# Patient Record
Sex: Female | Born: 1935 | Race: White | Hispanic: No | Marital: Married | Smoking: Never smoker
Health system: Southern US, Community
[De-identification: ages and names within clinical notes are randomized; demographics above are authoritative.]

## PROBLEM LIST (undated history)

## (undated) DIAGNOSIS — M47816 Spondylosis without myelopathy or radiculopathy, lumbar region: Secondary | ICD-10-CM

## (undated) DIAGNOSIS — N1831 Chronic kidney disease, stage 3a: Secondary | ICD-10-CM

## (undated) DIAGNOSIS — S99912A Unspecified injury of left ankle, initial encounter: Secondary | ICD-10-CM

## (undated) DIAGNOSIS — D6869 Other thrombophilia: Secondary | ICD-10-CM

## (undated) DIAGNOSIS — I10 Essential (primary) hypertension: Secondary | ICD-10-CM

## (undated) DIAGNOSIS — M539 Dorsopathy, unspecified: Secondary | ICD-10-CM

## (undated) DIAGNOSIS — M8580 Other specified disorders of bone density and structure, unspecified site: Secondary | ICD-10-CM

## (undated) DIAGNOSIS — Z5189 Encounter for other specified aftercare: Secondary | ICD-10-CM

## (undated) DIAGNOSIS — G63 Polyneuropathy in diseases classified elsewhere: Secondary | ICD-10-CM

## (undated) DIAGNOSIS — I498 Other specified cardiac arrhythmias: Secondary | ICD-10-CM

## (undated) DIAGNOSIS — IMO0002 Reserved for concepts with insufficient information to code with codable children: Secondary | ICD-10-CM

## (undated) DIAGNOSIS — M1990 Unspecified osteoarthritis, unspecified site: Secondary | ICD-10-CM

## (undated) DIAGNOSIS — G709 Myoneural disorder, unspecified: Secondary | ICD-10-CM

## (undated) DIAGNOSIS — E039 Hypothyroidism, unspecified: Secondary | ICD-10-CM

## (undated) DIAGNOSIS — D649 Anemia, unspecified: Secondary | ICD-10-CM

## (undated) DIAGNOSIS — E785 Hyperlipidemia, unspecified: Secondary | ICD-10-CM

## (undated) DIAGNOSIS — I4510 Unspecified right bundle-branch block: Secondary | ICD-10-CM

## (undated) DIAGNOSIS — N3281 Overactive bladder: Secondary | ICD-10-CM

## (undated) DIAGNOSIS — E1169 Type 2 diabetes mellitus with other specified complication: Secondary | ICD-10-CM

## (undated) DIAGNOSIS — Z950 Presence of cardiac pacemaker: Secondary | ICD-10-CM

## (undated) DIAGNOSIS — K2960 Other gastritis without bleeding: Secondary | ICD-10-CM

## (undated) HISTORY — DX: Overactive bladder: N32.81

## (undated) HISTORY — DX: Dorsopathy, unspecified: M53.9

## (undated) HISTORY — DX: Essential (primary) hypertension: I10

## (undated) HISTORY — DX: Other thrombophilia: D68.69

## (undated) HISTORY — DX: Unspecified right bundle-branch block: I45.10

## (undated) HISTORY — PX: APPENDECTOMY: SHX54

## (undated) HISTORY — DX: Hyperlipidemia, unspecified: E78.5

## (undated) HISTORY — DX: Other gastritis without bleeding: K29.60

## (undated) HISTORY — PX: ABDOMINAL HYSTERECTOMY: SHX81

## (undated) HISTORY — DX: Presence of cardiac pacemaker: Z95.0

## (undated) HISTORY — PX: ROTATOR CUFF REPAIR: SHX139

## (undated) HISTORY — DX: Hypothyroidism, unspecified: E03.9

## (undated) HISTORY — DX: Spondylosis without myelopathy or radiculopathy, lumbar region: M47.816

## (undated) HISTORY — DX: Reserved for concepts with insufficient information to code with codable children: IMO0002

## (undated) HISTORY — DX: Other specified cardiac arrhythmias: I49.8

## (undated) HISTORY — DX: Type 2 diabetes mellitus with other specified complication: E11.69

## (undated) HISTORY — DX: Chronic kidney disease, stage 3a: N18.31

## (undated) HISTORY — DX: Polyneuropathy in diseases classified elsewhere: G63

## (undated) HISTORY — PX: CARPAL TUNNEL RELEASE: SHX101

## (undated) HISTORY — PX: TONSILLECTOMY: SUR1361

## (undated) HISTORY — DX: Other specified disorders of bone density and structure, unspecified site: M85.80

---

## 2002-05-14 HISTORY — PX: OTHER SURGICAL HISTORY: SHX169

## 2008-11-11 HISTORY — PX: JOINT REPLACEMENT: SHX530

## 2008-11-22 ENCOUNTER — Inpatient Hospital Stay (HOSPITAL_COMMUNITY): Admission: RE | Admit: 2008-11-22 | Discharge: 2008-11-25 | Payer: Self-pay | Admitting: Orthopedic Surgery

## 2010-08-20 LAB — BASIC METABOLIC PANEL
BUN: 9 mg/dL (ref 6–23)
CO2: 28 mEq/L (ref 19–32)
CO2: 30 mEq/L (ref 19–32)
Calcium: 8.4 mg/dL (ref 8.4–10.5)
Chloride: 96 mEq/L (ref 96–112)
Creatinine, Ser: 1.01 mg/dL (ref 0.4–1.2)
GFR calc Af Amer: >60 mL/min (ref >60)
GFR calc Af Amer: >60 mL/min (ref >60)
GFR calc non Af Amer: 54 mL/min — ABNORMAL LOW (ref >60)
GFR calc non Af Amer: 54 mL/min — ABNORMAL LOW (ref >60)
GFR calc non Af Amer: 55 mL/min — ABNORMAL LOW (ref >60)
Glucose, Bld: 153 mg/dL — ABNORMAL HIGH (ref 70–99)
Potassium: 3.7 mEq/L (ref 3.5–5.1)
Potassium: 4.1 mEq/L (ref 3.5–5.1)
Sodium: 129 mEq/L — ABNORMAL LOW (ref 135–145)
Sodium: 133 mEq/L — ABNORMAL LOW (ref 135–145)

## 2010-08-20 LAB — PROTIME-INR: Prothrombin Time: 12.4 seconds (ref 11.6–15.2)

## 2010-08-20 LAB — URINALYSIS, ROUTINE W REFLEX MICROSCOPIC
Ketones, ur: NEGATIVE mg/dL
Nitrite: NEGATIVE
Protein, ur: NEGATIVE mg/dL
Urobilinogen, UA: 0.2 mg/dL (ref 0.0–1.0)

## 2010-08-20 LAB — CBC
HCT: 22.6 % — ABNORMAL LOW (ref 36.0–46.0)
HCT: 25.6 % — ABNORMAL LOW (ref 36.0–46.0)
HCT: 36.2 % (ref 36.0–46.0)
Hemoglobin: 9 g/dL — ABNORMAL LOW (ref 12.0–15.0)
Hemoglobin: 9.7 g/dL — ABNORMAL LOW (ref 12.0–15.0)
MCHC: 33.7 g/dL (ref 30.0–36.0)
MCHC: 34.8 g/dL (ref 30.0–36.0)
MCHC: 35.6 g/dL (ref 30.0–36.0)
MCHC: 37 g/dL — ABNORMAL HIGH (ref 30.0–36.0)
MCV: 86.2 fL (ref 78.0–100.0)
MCV: 86.4 fL (ref 78.0–100.0)
MCV: 87.2 fL (ref 78.0–100.0)
Platelets: 141 10*3/uL — ABNORMAL LOW (ref 150–400)
Platelets: 143 10*3/uL — ABNORMAL LOW (ref 150–400)
Platelets: 213 10*3/uL (ref 150–400)
RBC: 2.63 MIL/uL — ABNORMAL LOW (ref 3.87–5.11)
RBC: 2.96 MIL/uL — ABNORMAL LOW (ref 3.87–5.11)
RDW: 13.8 % (ref 11.5–15.5)
RDW: 14.1 % (ref 11.5–15.5)
WBC: 11.2 10*3/uL — ABNORMAL HIGH (ref 4.0–10.5)
WBC: 8.7 10*3/uL (ref 4.0–10.5)
WBC: 9.3 10*3/uL (ref 4.0–10.5)

## 2010-08-20 LAB — CROSSMATCH: Antibody Screen: NEGATIVE

## 2010-08-20 LAB — COMPREHENSIVE METABOLIC PANEL
Albumin: 4.4 g/dL (ref 3.5–5.2)
BUN: 15 mg/dL (ref 6–23)
CO2: 30 mEq/L (ref 19–32)
Calcium: 10.2 mg/dL (ref 8.4–10.5)
Chloride: 99 mEq/L (ref 96–112)
Creatinine, Ser: 1 mg/dL (ref 0.4–1.2)
GFR calc non Af Amer: 54 mL/min — ABNORMAL LOW (ref >60)
Total Bilirubin: 0.6 mg/dL (ref 0.3–1.2)

## 2010-08-20 LAB — GLUCOSE, CAPILLARY
Glucose-Capillary: 104 mg/dL — ABNORMAL HIGH (ref 70–99)
Glucose-Capillary: 116 mg/dL — ABNORMAL HIGH (ref 70–99)
Glucose-Capillary: 133 mg/dL — ABNORMAL HIGH (ref 70–99)
Glucose-Capillary: 144 mg/dL — ABNORMAL HIGH (ref 70–99)
Glucose-Capillary: 147 mg/dL — ABNORMAL HIGH (ref 70–99)

## 2010-08-20 LAB — DIFFERENTIAL
Basophils Absolute: 0 10*3/uL (ref 0.0–0.1)
Lymphocytes Relative: 22 % (ref 12–46)
Neutro Abs: 6 10*3/uL (ref 1.7–7.7)

## 2010-08-20 LAB — HEMOGLOBIN A1C
Hgb A1c MFr Bld: 5.7 % (ref 4.6–6.1)
Mean Plasma Glucose: 117 mg/dL

## 2010-08-20 LAB — APTT: aPTT: 28 seconds (ref 24–37)

## 2010-08-20 LAB — ABO/RH: ABO/RH(D): A NEG

## 2010-09-26 NOTE — Op Note (Signed)
Sabrina Hunt, Sabrina Hunt                ACCOUNT NO.:  [id number]   MEDICAL RECORD NO.:  [id number]          PATIENT TYPE:  INP   LOCATION:  5022                         FACILITY:  [facility]   PHYSICIAN:  Mila Homer. Sherlean Foot, M.D. DATE OF BIRTH:  04/28/1933   DATE OF PROCEDURE:  11/22/2008  DATE OF DISCHARGE:                               OPERATIVE REPORT   SURGEON:  Mila Homer. Sherlean Foot, MD   ASSISTANT:  Altamese Cabal, PA-C, and Skip Mayer, PA-C.  Also had a PA  student, Manufacturing systems engineer.   PREOPERATIVE DIAGNOSIS:  Left knee osteoarthritis.   POSTOPERATIVE DIAGNOSIS:  Left knee osteoarthritis.   PROCEDURE:  Left total knee arthroplasty.   INDICATIONS FOR PROCEDURE:  The patient is a 77 year old white female  with failure of conservative measures for osteoarthritis of the left  knee.  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  Informed consent was obtained.   DESCRIPTION OF PROCEDURE:  The patient was laid supine and administered  general anesthesia.  The left leg was prepped and draped in the usual  sterile fashion.  The extremity was exsanguinated with the Esmarch after  sterile prep and drape.  Tourniquet was inflated to 350 mmHg.  I made a  midline incision with a #10 blade approximately 6 inches in length.  I  then removed the synovium with a #10 blade and elevated the deep MCL off  the medial crest of tibia.  I used the extramedullary alignment system  on the tibia to make perpendicular cut to the anatomic axis.  I used  intramedullary system on the femur to make a 4-degree valgus cut since  this was a valgus knee.  Then I removed the ACL, PCL, medial lateral  menisci, posterior condylar osteophytes.  I then used the 10 and 12 mm  spacer blocks and 12-mm spacer block gave good flexion/extension gap  balance.  The patient had a very hypoplastic lateral femoral condyles  and so I did translate the femoral cutting block medially to ensure  there was enough  bone.  I then made the anterior and posterior chamfer  cuts with sagittal saw.  I then finished the femur with a size E  finishing block, size 4, tibial tray drilling keel.  I then trialed with  E femur 4 tibia to 12 insert, 32 patella had good flexion/extension gap  balance.  Then, I removed the components and copiously irrigated.  I  then cemented the components removing excess cement after irrigating.  I  let the cement to harden in extension.  I placed a Hemovac coming out  superolaterally, deep arthrotomy pain catheter coming out supermedial,  and superficial arthrotomy.  I let tourniquet down and obtained  hemostasis, copiously irrigated again.  I then closed the arthrotomy  with figure-of-eight normal Vicryl sutures, deep soft tissues with  buried 0 Vicryl sutures, subcutaneous with 2-0 Vicryl stitch and skin  staples.  Dressed with Xeroform dressing sponges, sterile Webril, and  TED stocking.   COMPLICATIONS:  None.   DRAINS:  On1 Hemovac and one pain catheter.   ESTIMATED BLOOD  LOSS:  300 mL.   TOURNIQUET TIME:  52 minutes.           ______________________________  Mila Homer Sherlean Foot, M.D.     SDL/MEDQ  D:  11/22/2008  T:  11/22/2008  Job:  [id number]

## 2011-06-15 DIAGNOSIS — Z01818 Encounter for other preprocedural examination: Secondary | ICD-10-CM | POA: Diagnosis not present

## 2011-06-15 DIAGNOSIS — E669 Obesity, unspecified: Secondary | ICD-10-CM | POA: Diagnosis not present

## 2011-06-15 DIAGNOSIS — E119 Type 2 diabetes mellitus without complications: Secondary | ICD-10-CM | POA: Diagnosis not present

## 2011-06-26 DIAGNOSIS — R05 Cough: Secondary | ICD-10-CM | POA: Diagnosis not present

## 2011-07-16 DIAGNOSIS — J209 Acute bronchitis, unspecified: Secondary | ICD-10-CM | POA: Diagnosis not present

## 2011-07-16 DIAGNOSIS — G609 Hereditary and idiopathic neuropathy, unspecified: Secondary | ICD-10-CM | POA: Diagnosis not present

## 2011-07-16 DIAGNOSIS — I10 Essential (primary) hypertension: Secondary | ICD-10-CM | POA: Diagnosis not present

## 2011-07-31 DIAGNOSIS — H2510 Age-related nuclear cataract, unspecified eye: Secondary | ICD-10-CM | POA: Diagnosis not present

## 2011-08-06 DIAGNOSIS — M25569 Pain in unspecified knee: Secondary | ICD-10-CM | POA: Diagnosis not present

## 2011-08-07 ENCOUNTER — Encounter (HOSPITAL_COMMUNITY): Payer: Self-pay | Admitting: Pharmacy Technician

## 2011-08-09 ENCOUNTER — Other Ambulatory Visit: Payer: Self-pay | Admitting: Orthopedic Surgery

## 2011-08-09 DIAGNOSIS — H40019 Open angle with borderline findings, low risk, unspecified eye: Secondary | ICD-10-CM | POA: Diagnosis not present

## 2011-08-09 NOTE — Pre-Procedure Instructions (Signed)
20 ASHMI BLAS  08/09/2011   Your procedure is scheduled on:  Monday August 20, 2011  Report to [facility] at 0645 AM.  Call this number if you have problems the morning of surgery: [phone]   Remember:   Do not eat food:After Midnight.  May have clear liquids: up to 4 Hours before arrival. (up to 2:45am)  Clear liquids include soda, tea, black coffee, apple or grape juice, broth.  Take these medicines the morning of surgery with A SIP OF WATER: gabapentin,    Do not wear jewelry, make-up or nail polish.  Do not wear lotions, powders, or perfumes. You may wear deodorant.  Do not shave 48 hours prior to surgery.  Do not bring valuables to the hospital.  Contacts, dentures or bridgework may not be worn into surgery.  Leave suitcase in the car. After surgery it may be brought to your room.  For patients admitted to the hospital, checkout time is 11:00 AM the day of discharge.   Patients discharged the day of surgery will not be allowed to drive home.  Name and phone number of your driver: family / friend  Special Instructions: CHG Shower Use Special Wash: 1/2 bottle night before surgery and 1/2 bottle morning of surgery.   Please read over the following fact sheets that you were given: Pain Booklet, Coughing and Deep Breathing, Blood Transfusion Information, Total Joint Packet, MRSA Information and Surgical Site Infection Prevention

## 2011-08-13 ENCOUNTER — Encounter (HOSPITAL_COMMUNITY)
Admission: RE | Admit: 2011-08-13 | Discharge: 2011-08-13 | Disposition: A | Discharge status: Home / Self Care | Payer: Medicare Other | Source: Ambulatory Visit | Attending: Orthopedic Surgery | Admitting: Orthopedic Surgery

## 2011-08-13 ENCOUNTER — Encounter (HOSPITAL_COMMUNITY): Payer: Self-pay

## 2011-08-13 ENCOUNTER — Other Ambulatory Visit: Payer: Self-pay

## 2011-08-13 DIAGNOSIS — M1710 Unilateral primary osteoarthritis, unspecified knee: Secondary | ICD-10-CM | POA: Diagnosis not present

## 2011-08-13 DIAGNOSIS — E119 Type 2 diabetes mellitus without complications: Secondary | ICD-10-CM | POA: Diagnosis not present

## 2011-08-13 DIAGNOSIS — Z96659 Presence of unspecified artificial knee joint: Secondary | ICD-10-CM | POA: Diagnosis not present

## 2011-08-13 DIAGNOSIS — D62 Acute posthemorrhagic anemia: Secondary | ICD-10-CM | POA: Diagnosis not present

## 2011-08-13 HISTORY — DX: Hyperlipidemia, unspecified: E78.5

## 2011-08-13 HISTORY — DX: Myoneural disorder, unspecified: G70.9

## 2011-08-13 HISTORY — DX: Encounter for other specified aftercare: Z51.89

## 2011-08-13 HISTORY — DX: Anemia, unspecified: D64.9

## 2011-08-13 HISTORY — DX: Unspecified injury of left ankle, initial encounter: S99.912A

## 2011-08-13 HISTORY — DX: Unspecified osteoarthritis, unspecified site: M19.90

## 2011-08-13 HISTORY — DX: Essential (primary) hypertension: I10

## 2011-08-13 LAB — COMPREHENSIVE METABOLIC PANEL
Alkaline Phosphatase: 58 U/L (ref 39–117)
BUN: 20 mg/dL (ref 6–23)
CO2: 27 mEq/L (ref 19–32)
Chloride: 102 mEq/L (ref 96–112)
GFR calc Af Amer: 58 mL/min — ABNORMAL LOW (ref >90)
Glucose, Bld: 102 mg/dL — ABNORMAL HIGH (ref 70–99)
Potassium: 3.7 mEq/L (ref 3.5–5.1)
Total Bilirubin: 0.3 mg/dL (ref 0.3–1.2)

## 2011-08-13 LAB — URINALYSIS, ROUTINE W REFLEX MICROSCOPIC
Bilirubin Urine: NEGATIVE
Nitrite: NEGATIVE
Specific Gravity, Urine: 1.009 (ref 1.005–1.030)
pH: 7 (ref 5.0–8.0)

## 2011-08-13 LAB — CBC
HCT: 34.3 % — ABNORMAL LOW (ref 36.0–46.0)
Hemoglobin: 11.4 g/dL — ABNORMAL LOW (ref 12.0–15.0)
RBC: 3.96 MIL/uL (ref 3.87–5.11)
WBC: 8.7 10*3/uL (ref 4.0–10.5)

## 2011-08-13 LAB — PROTIME-INR
INR: 0.97 (ref 0.00–1.49)
Prothrombin Time: 13.1 seconds (ref 11.6–15.2)

## 2011-08-13 LAB — URINE MICROSCOPIC-ADD ON

## 2011-08-13 LAB — DIFFERENTIAL
Basophils Relative: 0 % (ref 0–1)
Eosinophils Absolute: 0.2 10*3/uL (ref 0.0–0.7)
Neutrophils Relative %: 63 % (ref 43–77)

## 2011-08-13 LAB — APTT: aPTT: 31 seconds (ref 24–37)

## 2011-08-14 LAB — URINE CULTURE: Culture  Setup Time: 201304011110

## 2011-08-14 NOTE — Consult Note (Addendum)
Anesthesia:  Patient is a 78 year old female scheduled for a right TKA on 08/20/11.  History includes DM2, HTN, anemia, history of transfusion, HLD, neuromuscular disorder (not specified in Epic), non-smoker. S/P left TKA on 11/22/08.  Her PCP is Dr. Desmond Dike according to prior medical records.  Labs noted.  Glucose 102.  Cr 1.05.  H/H 11.4/34.3.  Coags WNL.  Urine culture is pending.  CXR on 08/13/11 showed COPD, no acute findings.  EKG from 08/13/11 showed NSR, right BBB, LAFB, bifascicular block.  No CV symptoms were reported at PAT.  She reported a prior stress test at [facility] done in 2008.  Records from there and her PCP office have been requested.  I'll follow-up once records available.  Addendum:  08/15/11 1430  I received records from her PCP.  She saw Dr. Sheria Lang for preoperative evaluation on 06/15/11. He felt she would be low risk for this procedure.  She has prior EKGs from 2010 and 10/07/09 that show a right BBB, which according to Dr. Glenetta Hew notes date back to 06/21/06 at the time of her stress test. I reviewed her EKGs with Anesthesiologist Dr. Chaney Malling who agrees with plan to proceed in remains asymptomatic from a CV standpoint.

## 2011-08-19 MED ORDER — VANCOMYCIN HCL IN DEXTROSE 1-5 GM/200ML-% IV SOLN
1000.0000 mg | INTRAVENOUS | Status: AC
  Administered 2011-08-20: 1000 mg via INTRAVENOUS
  Filled 2011-08-19: qty 200

## 2011-08-20 ENCOUNTER — Encounter (HOSPITAL_COMMUNITY)
Admission: RE | Disposition: A | Discharge status: Home Health | Payer: Self-pay | Source: Ambulatory Visit | Attending: Orthopedic Surgery

## 2011-08-20 ENCOUNTER — Ambulatory Visit (HOSPITAL_COMMUNITY): Payer: Medicare Other | Admitting: Vascular Surgery

## 2011-08-20 ENCOUNTER — Encounter (HOSPITAL_COMMUNITY): Payer: Self-pay

## 2011-08-20 ENCOUNTER — Encounter (HOSPITAL_COMMUNITY): Payer: Self-pay | Admitting: Vascular Surgery

## 2011-08-20 ENCOUNTER — Encounter (HOSPITAL_COMMUNITY): Payer: Self-pay | Admitting: Surgery

## 2011-08-20 ENCOUNTER — Inpatient Hospital Stay (HOSPITAL_COMMUNITY)
Admission: RE | Admit: 2011-08-20 | Discharge: 2011-08-22 | DRG: 470 | Disposition: A | Discharge status: Home Health | Payer: Medicare Other | Source: Ambulatory Visit | Attending: Orthopedic Surgery | Admitting: Orthopedic Surgery

## 2011-08-20 DIAGNOSIS — M1711 Unilateral primary osteoarthritis, right knee: Secondary | ICD-10-CM

## 2011-08-20 DIAGNOSIS — D62 Acute posthemorrhagic anemia: Secondary | ICD-10-CM | POA: Diagnosis not present

## 2011-08-20 DIAGNOSIS — M1710 Unilateral primary osteoarthritis, unspecified knee: Secondary | ICD-10-CM | POA: Diagnosis not present

## 2011-08-20 DIAGNOSIS — Z7982 Long term (current) use of aspirin: Secondary | ICD-10-CM | POA: Diagnosis not present

## 2011-08-20 DIAGNOSIS — Z96659 Presence of unspecified artificial knee joint: Secondary | ICD-10-CM

## 2011-08-20 DIAGNOSIS — E119 Type 2 diabetes mellitus without complications: Secondary | ICD-10-CM | POA: Diagnosis present

## 2011-08-20 DIAGNOSIS — G8918 Other acute postprocedural pain: Secondary | ICD-10-CM | POA: Diagnosis not present

## 2011-08-20 DIAGNOSIS — IMO0002 Reserved for concepts with insufficient information to code with codable children: Secondary | ICD-10-CM | POA: Diagnosis not present

## 2011-08-20 DIAGNOSIS — Z01812 Encounter for preprocedural laboratory examination: Secondary | ICD-10-CM | POA: Diagnosis not present

## 2011-08-20 HISTORY — PX: TOTAL KNEE ARTHROPLASTY: SHX125

## 2011-08-20 LAB — GLUCOSE, CAPILLARY
Glucose-Capillary: 106 mg/dL — ABNORMAL HIGH (ref 70–99)
Glucose-Capillary: 133 mg/dL — ABNORMAL HIGH (ref 70–99)

## 2011-08-20 SURGERY — ARTHROPLASTY, KNEE, TOTAL
Anesthesia: General | Site: Knee | Laterality: Right | Wound class: Clean

## 2011-08-20 MED ORDER — HYDROMORPHONE HCL PF 1 MG/ML IJ SOLN
0.2500 mg | INTRAMUSCULAR | Status: DC | PRN
  Administered 2011-08-20 (×2): 0.5 mg via INTRAVENOUS

## 2011-08-20 MED ORDER — ONDANSETRON HCL 4 MG PO TABS: 4.0000 mg | ORAL_TABLET | Freq: Four times a day (QID) | ORAL | Status: DC | PRN

## 2011-08-20 MED ORDER — METOCLOPRAMIDE HCL 10 MG PO TABS: 5.0000 mg | ORAL_TABLET | Freq: Three times a day (TID) | ORAL | Status: DC | PRN

## 2011-08-20 MED ORDER — CELECOXIB 200 MG PO CAPS
200.0000 mg | ORAL_CAPSULE | Freq: Two times a day (BID) | ORAL | Status: DC
  Administered 2011-08-20 – 2011-08-22 (×4): 200 mg via ORAL
  Filled 2011-08-20 (×5): qty 1

## 2011-08-20 MED ORDER — GABAPENTIN 300 MG PO CAPS
300.0000 mg | ORAL_CAPSULE | Freq: Every day | ORAL | Status: DC
  Administered 2011-08-20 – 2011-08-21 (×2): 300 mg via ORAL
  Filled 2011-08-20 (×3): qty 1

## 2011-08-20 MED ORDER — METHOCARBAMOL 100 MG/ML IJ SOLN
500.0000 mg | Freq: Four times a day (QID) | INTRAMUSCULAR | Status: DC | PRN
  Administered 2011-08-20: 500 mg via INTRAVENOUS
  Filled 2011-08-20: qty 5

## 2011-08-20 MED ORDER — ENOXAPARIN SODIUM 40 MG/0.4ML ~~LOC~~ SOLN
40.0000 mg | SUBCUTANEOUS | Status: DC
  Administered 2011-08-21 – 2011-08-22 (×2): 40 mg via SUBCUTANEOUS
  Filled 2011-08-20 (×3): qty 0.4

## 2011-08-20 MED ORDER — HYDROMORPHONE HCL PF 1 MG/ML IJ SOLN
0.5000 mg | INTRAMUSCULAR | Status: DC | PRN
Start: 2011-08-20 — End: 2011-08-22

## 2011-08-20 MED ORDER — SODIUM CHLORIDE 0.9 % IR SOLN
Status: DC | PRN
  Administered 2011-08-20: 3000 mL

## 2011-08-20 MED ORDER — METHOCARBAMOL 500 MG PO TABS
500.0000 mg | ORAL_TABLET | Freq: Four times a day (QID) | ORAL | Status: DC | PRN
  Administered 2011-08-20 – 2011-08-22 (×3): 500 mg via ORAL
  Filled 2011-08-20 (×4): qty 1

## 2011-08-20 MED ORDER — ROCURONIUM BROMIDE 100 MG/10ML IV SOLN
INTRAVENOUS | Status: DC | PRN
  Administered 2011-08-20: 50 mg via INTRAVENOUS

## 2011-08-20 MED ORDER — OXYCODONE HCL 5 MG PO TABS
5.0000 mg | ORAL_TABLET | ORAL | Status: DC | PRN
  Administered 2011-08-20: 5 mg via ORAL
  Administered 2011-08-20 (×2): 10 mg via ORAL
  Administered 2011-08-21: 5 mg via ORAL
  Administered 2011-08-22: 10 mg via ORAL
  Filled 2011-08-20: qty 2
  Filled 2011-08-20: qty 1
  Filled 2011-08-20 (×2): qty 2

## 2011-08-20 MED ORDER — OXYCODONE HCL 10 MG PO TB12
10.0000 mg | ORAL_TABLET | Freq: Two times a day (BID) | ORAL | Status: DC
  Administered 2011-08-20 – 2011-08-22 (×4): 10 mg via ORAL
  Filled 2011-08-20 (×4): qty 1

## 2011-08-20 MED ORDER — LOSARTAN POTASSIUM 50 MG PO TABS
100.0000 mg | ORAL_TABLET | Freq: Every day | ORAL | Status: DC
Start: 2011-08-20 — End: 2011-08-22
  Administered 2011-08-21 – 2011-08-22 (×2): 100 mg via ORAL
  Filled 2011-08-20 (×3): qty 2

## 2011-08-20 MED ORDER — SODIUM CHLORIDE 0.9 % IV SOLN: INTRAVENOUS | Status: DC

## 2011-08-20 MED ORDER — LACTATED RINGERS IV SOLN
INTRAVENOUS | Status: DC
  Administered 2011-08-20: 08:00:00 via INTRAVENOUS

## 2011-08-20 MED ORDER — ACETAMINOPHEN 325 MG PO TABS: 650.0000 mg | ORAL_TABLET | Freq: Four times a day (QID) | ORAL | Status: DC | PRN

## 2011-08-20 MED ORDER — BUPIVACAINE ON-Q PAIN PUMP (FOR ORDER SET NO CHG)
INJECTION | Status: DC
  Filled 2011-08-20: qty 1

## 2011-08-20 MED ORDER — FENTANYL CITRATE 0.05 MG/ML IJ SOLN
INTRAMUSCULAR | Status: DC | PRN
  Administered 2011-08-20: 50 ug via INTRAVENOUS
  Administered 2011-08-20 (×2): 100 ug via INTRAVENOUS

## 2011-08-20 MED ORDER — GLYCOPYRROLATE 0.2 MG/ML IJ SOLN
INTRAMUSCULAR | Status: DC | PRN
  Administered 2011-08-20: .8 mg via INTRAVENOUS

## 2011-08-20 MED ORDER — ONDANSETRON HCL 4 MG/2ML IJ SOLN
INTRAMUSCULAR | Status: DC | PRN
  Administered 2011-08-20: 4 mg via INTRAVENOUS

## 2011-08-20 MED ORDER — NEOSTIGMINE METHYLSULFATE 1 MG/ML IJ SOLN
INTRAMUSCULAR | Status: DC | PRN
  Administered 2011-08-20: 4 mg via INTRAVENOUS

## 2011-08-20 MED ORDER — ACETAMINOPHEN 650 MG RE SUPP: 650.0000 mg | Freq: Four times a day (QID) | RECTAL | Status: DC | PRN

## 2011-08-20 MED ORDER — DOCUSATE SODIUM 100 MG PO CAPS
100.0000 mg | ORAL_CAPSULE | Freq: Two times a day (BID) | ORAL | Status: DC
  Administered 2011-08-20 – 2011-08-22 (×4): 100 mg via ORAL
  Filled 2011-08-20 (×6): qty 1

## 2011-08-20 MED ORDER — LINAGLIPTIN 5 MG PO TABS
5.0000 mg | ORAL_TABLET | Freq: Every day | ORAL | Status: DC
  Administered 2011-08-21 – 2011-08-22 (×2): 5 mg via ORAL
  Filled 2011-08-20 (×2): qty 1

## 2011-08-20 MED ORDER — MIDAZOLAM HCL 5 MG/5ML IJ SOLN
INTRAMUSCULAR | Status: DC | PRN
  Administered 2011-08-20 (×2): 1 mg via INTRAVENOUS

## 2011-08-20 MED ORDER — BUPIVACAINE 0.25 % ON-Q PUMP SINGLE CATH 400 ML
INJECTION | Status: DC | PRN
  Administered 2011-08-20: 400 mL

## 2011-08-20 MED ORDER — MENTHOL 3 MG MT LOZG: 1.0000 | LOZENGE | OROMUCOSAL | Status: DC | PRN

## 2011-08-20 MED ORDER — METOCLOPRAMIDE HCL 5 MG/ML IJ SOLN: 5.0000 mg | Freq: Three times a day (TID) | INTRAMUSCULAR | Status: DC | PRN

## 2011-08-20 MED ORDER — BISACODYL 5 MG PO TBEC: 5.0000 mg | DELAYED_RELEASE_TABLET | Freq: Every day | ORAL | Status: DC | PRN

## 2011-08-20 MED ORDER — BUPIVACAINE-EPINEPHRINE 0.25% -1:200000 IJ SOLN
INTRAMUSCULAR | Status: DC | PRN
  Administered 2011-08-20: 10 mL

## 2011-08-20 MED ORDER — DIPHENHYDRAMINE HCL 12.5 MG/5ML PO ELIX: 12.5000 mg | ORAL_SOLUTION | ORAL | Status: DC | PRN

## 2011-08-20 MED ORDER — ATORVASTATIN CALCIUM 40 MG PO TABS
40.0000 mg | ORAL_TABLET | Freq: Every day | ORAL | Status: DC
  Administered 2011-08-20 – 2011-08-21 (×2): 40 mg via ORAL
  Filled 2011-08-20 (×3): qty 1

## 2011-08-20 MED ORDER — FLEET ENEMA 7-19 GM/118ML RE ENEM: 1.0000 | ENEMA | Freq: Once | RECTAL | Status: AC | PRN

## 2011-08-20 MED ORDER — ACETAMINOPHEN 10 MG/ML IV SOLN
1000.0000 mg | Freq: Four times a day (QID) | INTRAVENOUS | Status: DC
  Administered 2011-08-20: 1000 mg via INTRAVENOUS
  Filled 2011-08-20 (×3): qty 100

## 2011-08-20 MED ORDER — PHENOL 1.4 % MT LIQD: 1.0000 | OROMUCOSAL | Status: DC | PRN

## 2011-08-20 MED ORDER — SODIUM CHLORIDE 0.9 % IV SOLN
INTRAVENOUS | Status: DC
  Administered 2011-08-20: 1000 mL via INTRAVENOUS

## 2011-08-20 MED ORDER — CHLORHEXIDINE GLUCONATE 4 % EX LIQD: 60.0000 mL | Freq: Once | CUTANEOUS | Status: DC

## 2011-08-20 MED ORDER — LACTATED RINGERS IV SOLN
INTRAVENOUS | Status: DC | PRN
  Administered 2011-08-20 (×2): via INTRAVENOUS

## 2011-08-20 MED ORDER — ONDANSETRON HCL 4 MG/2ML IJ SOLN: 4.0000 mg | Freq: Four times a day (QID) | INTRAMUSCULAR | Status: DC | PRN

## 2011-08-20 MED ORDER — PROPOFOL 10 MG/ML IV EMUL
INTRAVENOUS | Status: DC | PRN
  Administered 2011-08-20: 30 mg via INTRAVENOUS
  Administered 2011-08-20: 120 mg via INTRAVENOUS

## 2011-08-20 MED ORDER — ZOLPIDEM TARTRATE 5 MG PO TABS: 5.0000 mg | ORAL_TABLET | Freq: Every evening | ORAL | Status: DC | PRN

## 2011-08-20 MED ORDER — ALUM & MAG HYDROXIDE-SIMETH 200-200-20 MG/5ML PO SUSP: 30.0000 mL | ORAL | Status: DC | PRN

## 2011-08-20 MED ORDER — SENNOSIDES-DOCUSATE SODIUM 8.6-50 MG PO TABS: 1.0000 | ORAL_TABLET | Freq: Every evening | ORAL | Status: DC | PRN

## 2011-08-20 MED ORDER — BUPIVACAINE-EPINEPHRINE PF 0.5-1:200000 % IJ SOLN
INTRAMUSCULAR | Status: DC | PRN
  Administered 2011-08-20: 30 mL

## 2011-08-20 MED ORDER — BUPIVACAINE 0.25 % ON-Q PUMP SINGLE CATH 300ML
300.0000 mL | INJECTION | Status: DC
  Filled 2011-08-20: qty 300

## 2011-08-20 MED ORDER — VANCOMYCIN HCL IN DEXTROSE 1-5 GM/200ML-% IV SOLN
1000.0000 mg | Freq: Two times a day (BID) | INTRAVENOUS | Status: AC
  Administered 2011-08-20: 1000 mg via INTRAVENOUS
  Filled 2011-08-20: qty 200

## 2011-08-20 SURGICAL SUPPLY — 64 items
BANDAGE ESMARK 6X9 LF (GAUZE/BANDAGES/DRESSINGS) ×1 IMPLANT
BLADE SAGITTAL 13X1.27X60 (BLADE) ×2 IMPLANT
BLADE SAW SGTL 83.5X18.5 (BLADE) ×2 IMPLANT
BLADE SURG 10 STRL SS (BLADE) ×2 IMPLANT
BNDG ESMARK 6X9 LF (GAUZE/BANDAGES/DRESSINGS) ×2
BOWL SMART MIX CTS (DISPOSABLE) ×2 IMPLANT
CATH KIT ON Q 5IN SLV (PAIN MANAGEMENT) ×2 IMPLANT
CEMENT BONE SIMPLEX SPEEDSET (Cement) ×4 IMPLANT
CLOTH BEACON ORANGE TIMEOUT ST (SAFETY) ×2 IMPLANT
COVER BACK TABLE 24X17X13 BIG (DRAPES) ×2 IMPLANT
COVER SURGICAL LIGHT HANDLE (MISCELLANEOUS) ×2 IMPLANT
CUFF TOURNIQUET SINGLE 34IN LL (TOURNIQUET CUFF) ×2 IMPLANT
DRAPE EXTREMITY T 121X128X90 (DRAPE) ×2 IMPLANT
DRAPE INCISE IOBAN 66X45 STRL (DRAPES) ×6 IMPLANT
DRAPE PROXIMA HALF (DRAPES) ×2 IMPLANT
DRAPE U-SHAPE 47X51 STRL (DRAPES) ×2 IMPLANT
DRSG ADAPTIC 3X8 NADH LF (GAUZE/BANDAGES/DRESSINGS) ×2 IMPLANT
DRSG PAD ABDOMINAL 8X10 ST (GAUZE/BANDAGES/DRESSINGS) ×2 IMPLANT
DURAPREP 26ML APPLICATOR (WOUND CARE) ×4 IMPLANT
ELECT CAUTERY BLADE 6.4 (BLADE) ×2 IMPLANT
ELECT REM PT RETURN 9FT ADLT (ELECTROSURGICAL) ×2
ELECTRODE REM PT RTRN 9FT ADLT (ELECTROSURGICAL) ×1 IMPLANT
EVACUATOR 1/8 PVC DRAIN (DRAIN) ×2 IMPLANT
GLOVE BIOGEL M 7.0 STRL (GLOVE) IMPLANT
GLOVE BIOGEL PI IND STRL 7.5 (GLOVE) ×1 IMPLANT
GLOVE BIOGEL PI IND STRL 8.5 (GLOVE) ×1 IMPLANT
GLOVE BIOGEL PI INDICATOR 7.5 (GLOVE) ×1
GLOVE BIOGEL PI INDICATOR 8.5 (GLOVE) ×1
GLOVE SURG ORTHO 8.0 STRL STRW (GLOVE) IMPLANT
GLOVE SURG SS PI 6.5 STRL IVOR (GLOVE) ×2 IMPLANT
GLOVE SURG SS PI 7.0 STRL IVOR (GLOVE) ×2 IMPLANT
GLOVE SURG SS PI 8.5 STRL IVOR (GLOVE) ×2
GLOVE SURG SS PI 8.5 STRL STRW (GLOVE) ×2 IMPLANT
GOWN PREVENTION PLUS XLARGE (GOWN DISPOSABLE) ×2 IMPLANT
GOWN STRL NON-REIN LRG LVL3 (GOWN DISPOSABLE) ×4 IMPLANT
HANDPIECE INTERPULSE COAX TIP (DISPOSABLE) ×1
HOOD PEEL AWAY FACE SHEILD DIS (HOOD) ×6 IMPLANT
KIT BASIN OR (CUSTOM PROCEDURE TRAY) ×2 IMPLANT
KIT ROOM TURNOVER OR (KITS) ×2 IMPLANT
MANIFOLD NEPTUNE II (INSTRUMENTS) ×2 IMPLANT
NEEDLE 22X1 1/2 (OR ONLY) (NEEDLE) ×2 IMPLANT
NS IRRIG 1000ML POUR BTL (IV SOLUTION) ×2 IMPLANT
PACK TOTAL JOINT (CUSTOM PROCEDURE TRAY) ×2 IMPLANT
PAD ARMBOARD 7.5X6 YLW CONV (MISCELLANEOUS) ×4 IMPLANT
PADDING CAST COTTON 6X4 STRL (CAST SUPPLIES) ×2 IMPLANT
POSITIONER HEAD PRONE TRACH (MISCELLANEOUS) ×2 IMPLANT
SET HNDPC FAN SPRY TIP SCT (DISPOSABLE) ×1 IMPLANT
SPONGE GAUZE 4X4 12PLY (GAUZE/BANDAGES/DRESSINGS) ×2 IMPLANT
STAPLER VISISTAT 35W (STAPLE) ×2 IMPLANT
SUCTION FRAZIER TIP 10 FR DISP (SUCTIONS) ×2 IMPLANT
SUT BONE WAX W31G (SUTURE) ×2 IMPLANT
SUT VIC AB 0 CT1 27 (SUTURE) ×1
SUT VIC AB 0 CT1 27XBRD ANBCTR (SUTURE) ×1 IMPLANT
SUT VIC AB 0 CTB1 27 (SUTURE) ×4 IMPLANT
SUT VIC AB 1 CT1 27 (SUTURE) ×2
SUT VIC AB 1 CT1 27XBRD ANBCTR (SUTURE) ×2 IMPLANT
SUT VIC AB 2-0 CT1 27 (SUTURE) ×3
SUT VIC AB 2-0 CT1 TAPERPNT 27 (SUTURE) ×3 IMPLANT
SYR CONTROL 10ML LL (SYRINGE) ×2 IMPLANT
TOWEL OR 17X24 6PK STRL BLUE (TOWEL DISPOSABLE) ×2 IMPLANT
TOWEL OR 17X26 10 PK STRL BLUE (TOWEL DISPOSABLE) ×2 IMPLANT
TRAY FOLEY BAG SILVER LF 14FR (CATHETERS) ×2 IMPLANT
TRAY FOLEY CATH 14FR (SET/KITS/TRAYS/PACK) ×2 IMPLANT
WATER STERILE IRR 1000ML POUR (IV SOLUTION) ×2 IMPLANT

## 2011-08-20 NOTE — Progress Notes (Signed)
Orthopedic Tech Progress Note Patient Details:  Sabrina Hunt [birth year] [id number]  CPM Right Knee CPM Right Knee: On Right Knee Flexion (Degrees): 90  Right Knee Extension (Degrees): 0  Additional Comments: trapeze bar    Cammer, Mickie Bail 08/20/2011, 11:38 AM

## 2011-08-20 NOTE — H&P (Signed)
Sabrina Hunt MRN:  [id number] DOB/SEX:  1933/05/04/female  CHIEF COMPLAINT:  Painful right Knee  HISTORY: Patient is a 78 y.o. female presented with a history of pain in the right knee. Onset of symptoms was gradual starting several years ago with gradually worsening course since that time. The patient noted no past surgery on the right knee. Prior procedures on the knee include arthroscopy. Patient has been treated conservatively with over-the-counter NSAIDs and activity modification. Patient currently rates pain in the knee at 9 out of 10 with activity. There is pain at night.  PAST MEDICAL HISTORY: There are no active problems to display for this patient.  Past Medical History  Diagnosis Date  . Hypertension   . Constipation   . Diabetes mellitus   . Left ankle injury     Swelling  . Anemia   . Hyperlipidemia   . Arthritis   . Neuromuscular disorder   . Blood transfusion     2010   Past Surgical History  Procedure Date  . Tonsillectomy   . Appendectomy   . Abdominal hysterectomy   . Rotator cuff repair     Right  . Joint replacement 11/2008    Left Knee Replacement  . Carpal tunnel release     Left  . Colonscopy 2004     MEDICATIONS:   Prescriptions prior to admission  Medication Sig Dispense Refill  . aspirin EC 81 MG tablet Take 81 mg by mouth daily.      . Calcium-Vitamin D (CALTRATE 600 PLUS-VIT D PO) Take 1 tablet by mouth 2 (two) times daily.      . Cholecalciferol (VITAMIN D-3 PO) Take 1 capsule by mouth daily.      Marland Kitchen gabapentin (NEURONTIN) 300 MG capsule Take 300 mg by mouth at bedtime.      Marland Kitchen losartan (COZAAR) 100 MG tablet Take 100 mg by mouth daily.      . Multiple Vitamin (MULITIVITAMIN WITH MINERALS) TABS Take 1 tablet by mouth daily.      . rosuvastatin (CRESTOR) 20 MG tablet Take 20 mg by mouth daily.      . sitaGLIPtin (JANUVIA) 50 MG tablet Take 50 mg by mouth daily.        ALLERGIES:   Allergies  Allergen Reactions  . Amoxicillin Nausea  And Vomiting  . Hydrocodone Nausea Only  . Latex   . Prednisone Nausea And Vomiting    REVIEW OF SYSTEMS:  Pertinent items are noted in HPI.   FAMILY HISTORY:   Family History  Problem Relation Age of Onset  . Anesthesia problems Neg Hx     SOCIAL HISTORY:   History  Substance Use Topics  . Smoking status: Never Smoker   . Smokeless tobacco: Not on file  . Alcohol Use: No     EXAMINATION:  Vital signs in last 24 hours: Temp:  [98.3 F (36.8 C)] 98.3 F (36.8 C) (04/08 0707) Pulse Rate:  [83] 83  (04/08 0707) Resp:  [18] 18  (04/08 0707) BP: (156)/(78) 156/78 mmHg (04/08 0707) SpO2:  [96 %] 96 % (04/08 0707)  General appearance: alert, cooperative and no distress Lungs: clear to auscultation bilaterally Heart: regular rate and rhythm, S1, S2 normal, no murmur, click, rub or gallop Abdomen: soft, non-tender; bowel sounds normal; no masses,  no organomegaly Extremities: extremities normal, atraumatic, no cyanosis or edema and Homans sign is negative, no sign of DVT Pulses: 2+ and symmetric Skin: Skin color, texture, turgor normal. No rashes or lesions  Neurologic: Alert and oriented X 3, normal strength and tone. Normal symmetric reflexes. Normal coordination and gait  Musculoskeletal:  ROM 0-100, Ligaments intact,  Imaging Review Plain radiographs demonstrate severe degenerative joint disease of the right knee. The overall alignment is mild valgus. The bone quality appears to be good for age and reported activity level.  Assessment/Plan: End stage arthritis, right knee   The patient history, physical examination and imaging studies are consistent with advanced degenerative joint disease of the right knee. The patient has failed conservative treatment.  The clearance notes were reviewed.  After discussion with the patient it was felt that Total Knee Replacement was indicated. The procedure,  risks, and benefits of total knee arthroplasty were presented and reviewed. The  risks including but not limited to aseptic loosening, infection, blood clots, vascular injury, stiffness, patella tracking problems complications among others were discussed. The patient acknowledged the explanation, agreed to proceed with the plan.  Sabrina Hunt 08/20/2011, 7:30 AM

## 2011-08-20 NOTE — Anesthesia Preprocedure Evaluation (Addendum)
Anesthesia Evaluation  Patient identified by MRN, date of birth, ID band Patient awake    Reviewed: Allergy & Precautions, H&P , NPO status , Patient's Chart, lab work & pertinent test results, reviewed documented beta blocker date and time   Airway Mallampati: I      Dental No notable dental hx. (+) Edentulous Upper, Edentulous Lower and Dental Advisory Given   Pulmonary neg pulmonary ROS,  breath sounds clear to auscultation  Pulmonary exam normal       Cardiovascular hypertension, Pt. on medications negative cardio ROS  Rhythm:Regular Rate:Normal     Neuro/Psych  Neuromuscular disease negative neurological ROS  negative psych ROS   GI/Hepatic negative GI ROS, Neg liver ROS,   Endo/Other  negative endocrine ROSDiabetes mellitus-, Well Controlled, Type 2  Renal/GU negative Renal ROS  negative genitourinary   Musculoskeletal   Abdominal   Peds  Hematology negative hematology ROS (+)   Anesthesia Other Findings   Reproductive/Obstetrics negative OB ROS                          Anesthesia Physical Anesthesia Plan  ASA: III  Anesthesia Plan: General   Post-op Pain Management:    Induction: Intravenous  Airway Management Planned: Oral ETT  Additional Equipment:   Intra-op Plan:   Post-operative Plan: Extubation in OR  Informed Consent: I have reviewed the patients History and Physical, chart, labs and discussed the procedure including the risks, benefits and alternatives for the proposed anesthesia with the patient or authorized representative who has indicated his/her understanding and acceptance.   Dental advisory given  Plan Discussed with: Anesthesiologist  Anesthesia Plan Comments:        Anesthesia Quick Evaluation

## 2011-08-20 NOTE — Anesthesia Postprocedure Evaluation (Signed)
  Anesthesia Post-op Note  Patient: Sabrina Hunt  Procedure(s) Performed: Procedure(s) (LRB): TOTAL KNEE ARTHROPLASTY (Right)  Patient Location: PACU  Anesthesia Type: GA combined with regional for post-op pain  Level of Consciousness: awake, alert  and oriented  Airway and Oxygen Therapy: Patient Spontanous Breathing and Patient connected to nasal cannula oxygen  Post-op Pain: mild  Post-op Assessment: Post-op Vital signs reviewed, Patient's Cardiovascular Status Stable, Respiratory Function Stable, Patent Airway and No signs of Nausea or vomiting  Post-op Vital Signs: Reviewed and stable  Complications: No apparent anesthesia complications

## 2011-08-20 NOTE — Progress Notes (Signed)
Pt denies chest pain and/or shortness of breath. Pink color.  Will continue to proceed w/pre-op[.//L. Yonah Tangeman,RN

## 2011-08-20 NOTE — Preoperative (Signed)
Beta Blockers   Reason not to administer Beta Blockers:Not Applicable. No home beta blockers 

## 2011-08-20 NOTE — Transfer of Care (Signed)
Immediate Anesthesia Transfer of Care Note  Patient: Sabrina Hunt  Procedure(s) Performed: Procedure(s) (LRB): TOTAL KNEE ARTHROPLASTY (Right)  Patient Location: PACU  Anesthesia Type: GA combined with regional for post-op pain  Level of Consciousness: sedated  Airway & Oxygen Therapy: Patient Spontanous Breathing and Patient connected to nasal cannula oxygen  Post-op Assessment: Report given to PACU RN and Post -op Vital signs reviewed and stable  Post vital signs: Reviewed  Complications: No apparent anesthesia complications

## 2011-08-20 NOTE — Anesthesia Procedure Notes (Addendum)
Anesthesia Regional Block:  Femoral nerve block  Pre-Anesthetic Checklist: ,, timeout performed, Correct Patient, Correct Site, Correct Laterality, Correct Procedure, Correct Position, site marked, Risks and benefits discussed, pre-op evaluation,  At surgeon's request and post-op pain management  Laterality: Right  Prep: Maximum Sterile Barrier Precautions used and chloraprep       Needles:  Injection technique: Single-shot  Needle Type: Echogenic Stimulator Needle      Needle Gauge: 22 and 22 G    Additional Needles:  Procedures: nerve stimulator Femoral nerve block  Nerve Stimulator or Paresthesia:  Response: Patellar respose, 0.4 mA,   Additional Responses:   Narrative:  Start time: 08/20/2011 8:20 AM End time: 08/20/2011 8:30 AM Injection made incrementally with aspirations every 5 mL. Anesthesiologist: Fitzgerald,MD  Additional Notes: 2% Lidocaine skin wheel.   Femoral nerve block Procedure Name: Intubation Date/Time: 08/20/2011 8:52 AM Performed by: Mancel Parsons Pre-anesthesia Checklist: Patient identified, Emergency Drugs available, Suction available, Patient being monitored and Timeout performed Patient Re-evaluated:Patient Re-evaluated prior to inductionOxygen Delivery Method: Circle system utilized Preoxygenation: Pre-oxygenation with 100% oxygen Intubation Type: IV induction Ventilation: Mask ventilation without difficulty and Oral airway inserted - appropriate to patient size Laryngoscope Size: Mac and 3 Grade View: Grade I Tube type: Oral Tube size: 7.0 mm Number of attempts: 1 Airway Equipment and Method: Stylet Placement Confirmation: ETT inserted through vocal cords under direct vision,  positive ETCO2 and breath sounds checked- equal and bilateral Secured at: 22 cm Tube secured with: Tape Dental Injury: Teeth and Oropharynx as per pre-operative assessment

## 2011-08-21 ENCOUNTER — Encounter (HOSPITAL_COMMUNITY): Payer: Self-pay | Admitting: Orthopedic Surgery

## 2011-08-21 LAB — CBC
HCT: 24.7 % — ABNORMAL LOW (ref 36.0–46.0)
MCHC: 33.2 g/dL (ref 30.0–36.0)
Platelets: 121 10*3/uL — ABNORMAL LOW (ref 150–400)
RDW: 13.3 % (ref 11.5–15.5)
WBC: 8.3 10*3/uL (ref 4.0–10.5)

## 2011-08-21 LAB — BASIC METABOLIC PANEL
Chloride: 102 mEq/L (ref 96–112)
GFR calc Af Amer: 60 mL/min — ABNORMAL LOW (ref >90)
GFR calc non Af Amer: 52 mL/min — ABNORMAL LOW (ref >90)
Potassium: 3.9 mEq/L (ref 3.5–5.1)
Sodium: 135 mEq/L (ref 135–145)

## 2011-08-21 LAB — GLUCOSE, CAPILLARY
Glucose-Capillary: 124 mg/dL — ABNORMAL HIGH (ref 70–99)
Glucose-Capillary: 128 mg/dL — ABNORMAL HIGH (ref 70–99)
Glucose-Capillary: 131 mg/dL — ABNORMAL HIGH (ref 70–99)
Glucose-Capillary: 157 mg/dL — ABNORMAL HIGH (ref 70–99)

## 2011-08-21 LAB — PREPARE RBC (CROSSMATCH)

## 2011-08-21 MED ORDER — FUROSEMIDE 10 MG/ML IJ SOLN
20.0000 mg | Freq: Once | INTRAMUSCULAR | Status: AC
  Administered 2011-08-21: 20 mg via INTRAVENOUS
  Filled 2011-08-21 (×2): qty 2

## 2011-08-21 NOTE — Progress Notes (Signed)
Pt was rounded on, vitals were taken, HVAC emptied, was in CPM machine, bed alarm was on. Approximately 20 minutes later, Sabrina Cabal, PA entered pt's room to find her sitting at EOB. She states to PA "I am about to use the potty chair." He asks her if she needs staff assistance. She tells him that she feels comfortable doing it herself, with her husband's assistance. PA reentered room a few minutes later to find pt sitting on the floor. She tells him that her husband assisted her to the floor, that she did not fall. PA and NT assisted pt back to bed. No injury was sustained. VS at 1730: HR 98, R 16, BP 111/38. Further education was done with pt and husband to promote safety/prevent falls. Discussed at length with pt and husband the importance of safety when she is discharged. They verbalized understanding. Tammy Sours

## 2011-08-21 NOTE — Evaluation (Signed)
Occupational Therapy Evaluation Patient Details Name: Sabrina Hunt MRN: [id number] DOB: April 03, 1933 Today's Date: 08/21/2011  Problem List: There is no problem list on file for this patient.   Past Medical History:  Past Medical History  Diagnosis Date  . Hypertension   . Constipation   . Diabetes mellitus   . Left ankle injury     Swelling  . Anemia   . Hyperlipidemia   . Arthritis   . Neuromuscular disorder   . Blood transfusion     2010   Past Surgical History:  Past Surgical History  Procedure Date  . Tonsillectomy   . Appendectomy   . Abdominal hysterectomy   . Rotator cuff repair     Right  . Joint replacement 11/2008    Left Knee Replacement  . Carpal tunnel release     Left  . Colonscopy 2004    OT Assessment/Plan/Recommendation OT Assessment Clinical Impression Statement: Pt. presents s/p rt. TKA and with increased pain. Pt. will benefit from skilled OT to increase functional independence with ADLs to supervision level at D/C home to decrease burden of care. OT Recommendation/Assessment: Patient will need skilled OT in the acute care venue OT Problem List: Decreased activity tolerance;Decreased safety awareness;Decreased knowledge of use of DME or AE;Decreased knowledge of precautions;Pain Barriers to Discharge: None OT Therapy Diagnosis : Acute pain OT Plan OT Frequency: Min 2X/week OT Treatment/Interventions: Self-care/ADL training;DME and/or AE instruction;Therapeutic activities;Patient/family education;Balance training OT Recommendation Follow Up Recommendations: Home health OT;Supervision - Intermittent Equipment Recommended: None recommended by OT Individuals Consulted Consulted and Agree with Results and Recommendations: Patient OT Goals Acute Rehab OT Goals OT Goal Formulation: With patient Time For Goal Achievement: 7 days ADL Goals Pt Will Perform Grooming: with set-up;with supervision;Standing at sink ADL Goal: Grooming - Progress: Goal set  today Pt Will Perform Lower Body Bathing: with set-up;with supervision;Sit to stand from chair;with adaptive equipment ADL Goal: Lower Body Bathing - Progress: Goal set today Pt Will Perform Lower Body Dressing: with set-up;with supervision;with adaptive equipment;Sit to stand from chair ADL Goal: Lower Body Dressing - Progress: Goal set today Pt Will Transfer to Toilet: with supervision;with DME;Ambulation;3-in-1 ADL Goal: Toilet Transfer - Progress: Goal set today Pt Will Perform Tub/Shower Transfer: Shower transfer;with supervision;Ambulation;with DME ADL Goal: Tub/Shower Transfer - Progress: Goal set today  OT Evaluation Precautions/Restrictions  Precautions Precautions: Knee Restrictions Weight Bearing Restrictions: Yes RLE Weight Bearing: Weight bearing as tolerated Prior Functioning Home Living Lives With: Spouse Type of Home: House Home Layout: One level Home Access: Stairs to enter Entrance Stairs-Rails: Right Entrance Stairs-Number of Steps: 2 Bathroom Shower/Tub: Walk-in shower;Tub/shower unit Bathroom Toilet: Handicapped height Home Adaptive Equipment: Bedside commode/3-in-1;Walker - rolling;Straight cane;Built-in shower seat Prior Function Level of Independence: Independent with basic ADLs;Independent with gait;Independent with transfers Able to Take Stairs?: Yes Driving: Yes Vocation: Retired ADL ADL Eating/Feeding: Simulated;Independent Where Assessed - Eating/Feeding: Chair Grooming: Performed;Wash/dry face;Set up Where Assessed - Grooming: Sitting, chair Upper Body Bathing: Simulated;Chest;Right arm;Left arm;Abdomen;Set up Where Assessed - Upper Body Bathing: Sitting, chair Lower Body Bathing: Simulated;Maximal assistance Where Assessed - Lower Body Bathing: Sit to stand from chair Upper Body Dressing: Performed;Set up Upper Body Dressing Details (indicate cue type and reason): With donning gown Where Assessed - Upper Body Dressing: Sitting, chair Lower  Body Dressing: Maximal assistance;Performed Lower Body Dressing Details (indicate cue type and reason): With donning bil socks Where Assessed - Lower Body Dressing: Sit to stand from bed Toilet Transfer: Simulated;+2 Total assistance;Comment for patient % (50%) Toilet  Transfer Details (indicate cue type and reason): Mod verbal cues for hand placement and tactile facilitation of rt. hand to RW due to pt. not transitioning UE support to RW from bed Toilet Transfer Method: Stand pivot Toilet Transfer Equipment: Other (comment) Nurse, children's) Toileting - Clothing Manipulation: Simulated;Maximal assistance Where Assessed - Toileting Clothing Manipulation: Sit to stand from 3-in-1 or toilet Toileting - Hygiene: Simulated;Maximal assistance Where Assessed - Toileting Hygiene: Sit to stand from 3-in-1 or toilet Tub/Shower Transfer: Not assessed Equipment Used: Rolling walker Ambulation Related to ADLs: NA ADL Comments: Pt. educated on techniques for completing LB ADLs due to pt. unable to reach all the way down rt. LE. Pt. with rt. LE knee buckling during session and provided with mod verbal cues to maintain straight LE.  Cognition Cognition Arousal/Alertness: Awake/alert Overall Cognitive Status: Appears within functional limits for tasks assessed Orientation Level: Oriented X4 Sensation/Coordination Sensation Light Touch: Appears Intact Extremity Assessment RUE Assessment RUE Assessment: Within Functional Limits LUE Assessment LUE Assessment: Within Functional Limits Mobility  Bed Mobility Bed Mobility: Yes Supine to Sit: 3: Mod assist;With rails;HOB flat Supine to Sit Details (indicate cue type and reason): assist for R LE management, assist at trunk, max directional verbal cues Transfers Transfers: Yes Sit to Stand: 1: +2 Total assist;Patient percentage (comment);From bed;With upper extremity assist (pt=50%) Sit to Stand Details (indicate cue type and reason): Max verbal cues for hand  placement and safety    End of Session OT - End of Session Equipment Utilized During Treatment: Gait belt Activity Tolerance: Patient tolerated treatment well Patient left: in chair;with call bell in reach Nurse Communication: Mobility status for transfers General Behavior During Session: [facility] for tasks performed Cognition: Impaired (due to pain meds pt states "I'm drunk") Cognitive Impairment: Decreased safety and following of commands   Juliette Standre, OTR/L Pager [phone] 08/21/2011, 12:21 PM

## 2011-08-21 NOTE — Progress Notes (Signed)
Georgena Spurling, MD   Altamese Cabal, PA-C [address] [place], [place], [place]  [address]                             [phone]   PROGRESS NOTE  Subjective:  negative for Chest Pain  negative for Shortness of Breath  negative for Nausea/Vomiting   negative for Calf Pain  negative for Bowel Movement   Tolerating Diet: yes         Patient reports pain as 6 on 0-10 scale.    Objective: Vital signs in last 24 hours:   Patient Vitals for the past 24 hrs:  BP Temp Pulse Resp SpO2 Height Weight  08/21/11 0603 121/51 mmHg 99 F (37.2 C) 86  19  100 % - -  08/21/11 0215 110/56 mmHg 99.3 F (37.4 C) 77  19  99 % - -  08/21/11 0041 - 98.3 F (36.8 C) - - - - -  08/20/11 2152 140/63 mmHg 99.8 F (37.7 C) 83  20  98 % - -  08/20/11 1831 136/52 mmHg 98.9 F (37.2 C) 70  18  97 % - -  08/20/11 1511 133/49 mmHg 98.9 F (37.2 C) 73  18  96 % 5\' 6"  (1.676 m) 87.544 kg (193 lb)  08/20/11 1230 127/44 mmHg 98.2 F (36.8 C) - - - - -  08/20/11 1200 128/46 mmHg - - - - - -  08/20/11 1150 - - 66  14  100 % - -  08/20/11 1145 131/45 mmHg - 61  13  99 % - -  08/20/11 1141 124/59 mmHg - - - - - -  08/20/11 1130 - - 73  15  99 % - -  08/20/11 1126 137/61 mmHg - - - - - -  08/20/11 1115 - - 79  15  97 % - -  08/20/11 1111 149/70 mmHg - - - - - -  08/20/11 1110 - 97.8 F (36.6 C) - - - - -    @flow {1959:LAST@   Intake/Output from previous day:   04/08 0701 - 04/09 0700 In: 2615 [P.O.:240; I.V.:2375] Out: 2895 [Urine:2100; Drains:775]   Intake/Output this shift:       Intake/Output      04/08 0701 - 04/09 0700 04/09 0701 - 04/10 0700   P.O. 240    I.V. (mL/kg) 2375 (27.1)    Total Intake(mL/kg) 2615 (29.9)    Urine (mL/kg/hr) 2100 (1)    Drains 775    Blood 20    Total Output 2895    Net -280            LABORATORY DATA:  Basename 08/21/11 0535  WBC 8.3  HGB 8.2*  HCT 24.7*  PLT 121*    Basename 08/21/11 0535  NA 135  K 3.9  CL 102  CO2 28  BUN 13  CREATININE  1.02  GLUCOSE 137*  CALCIUM 8.6   Lab Results  Component Value Date   INR 0.97 08/13/2011   INR 0.9 11/18/2008    Examination:  General appearance: alert, cooperative and no distress Extremities: Homans sign is negative, no sign of DVT  Wound Exam: clean, dry, intact   Drainage:  Scant/small amount Serosanguinous exudate  Motor Exam: EHL and FHL Intact  Sensory Exam: Deep Peroneal normal  Vascular Exam:    Assessment:    1 Day Post-Op  Procedure(s) (LRB): TOTAL KNEE ARTHROPLASTY (Right)  ADDITIONAL  DIAGNOSIS:  Active Problems:  * No active hospital problems. *   Acute Blood Loss Anemia   Plan: Physical Therapy as ordered Weight Bearing as Tolerated (WBAT)  DVT Prophylaxis:  Lovenox  DISCHARGE PLAN: Home  DISCHARGE NEEDS: HHPT, CPM, Walker and 3-in-1 comode seat         Geneveive Furness 08/21/2011, 8:32 AM

## 2011-08-21 NOTE — Progress Notes (Signed)
Physical Therapy Evaluation Note  Past Medical History  Diagnosis Date  . Hypertension   . Constipation   . Diabetes mellitus   . Left ankle injury     Swelling  . Anemia   . Hyperlipidemia   . Arthritis   . Neuromuscular disorder   . Blood transfusion     2010    Past Surgical History  Procedure Date  . Tonsillectomy   . Appendectomy   . Abdominal hysterectomy   . Rotator cuff repair     Right  . Joint replacement 11/2008    Left Knee Replacement  . Carpal tunnel release     Left  . Colonscopy 2004     08/21/11 1032  PT Visit Information  Last PT Received On 08/21/11  Precautions  Precautions Knee  Restrictions  RLE Weight Bearing WBAT  Home Living  Lives With Spouse  Type of Home House  Home Layout One level  Home Access Stairs to enter  Entrance Stairs-Rails Right  Entrance Stairs-Number of Steps 2  Bathroom Shower/Tub Walk-in shower;Tub/shower unit  Firefighter Handicapped height  Home Adaptive Equipment Bedside commode/3-in-1;Walker - rolling;Straight cane;Built-in shower seat  Prior Function  Level of Independence Independent with basic ADLs;Independent with gait;Independent with transfers  Able to Take Stairs? Yes  Driving Yes  Vocation Retired  Public house manager  Overall Cognitive Status Appears within functional limits for tasks assessed  Orientation Level Oriented X4  Sensation  Light Touch Appears Intact  Bed Mobility  Bed Mobility Yes  Supine to Sit 3: Mod assist;With rails;HOB flat  Supine to Sit Details (indicate cue type and reason) assist for R LE management, assist at trunk, max directional verbal cues  Transfers  Transfers Yes  Sit to Stand 1: +1 Total assist;From bed (pt= 50%)  Sit to Stand Details (indicate cue type and reason) pt c/o "I'm drunk", pt with lateral sway, max directional verbal cues, significant increase in time to transition R hand up to RW. unable to achieve full upright posture, + R knee  buckling  Stand Pivot Transfers 1: +2 Total assist (pt = 20%)  Stand Pivot Transfer Details (indicate cue type and reason) pt unable to process directional cues or achieve R quad set/increased bilat UE WBing to prevent R knee buckling. PT/OT was required to safely dependently transfer patient to chair due to pt inability to advance LEs or manage walker  Ambulation/Gait  Ambulation/Gait (pt unable to amb this date due to dizziness)  Wheelchair Mobility  Wheelchair Mobility No  Posture/Postural Control  Posture/Postural Control No significant limitations  Balance  Balance Assessed No  RUE Assessment  RUE Assessment WFL  LUE Assessment  LUE Assessment WFL  RLE Assessment  RLE Assessment X (R knee decreased ROM and strengh)  LLE Assessment  LLE Assessment WFL  Cervical Assessment  Cervical Assessment [facility]  Thoracic Assessment  Thoracic Assessment WFL  PT - End of Session  Equipment Utilized During Treatment Gait belt  Activity Tolerance (limited by dizziness, BP 142/40 - RN aware)  Patient left in chair;with call bell in reach;with family/visitor present  Nurse Communication Mobility status for transfers;Mobility status for ambulation (RN made aware of dizziness, pt is receiving blood)  CPM Right Knee  CPM Right Knee Off  General  Behavior During Session [facility] for tasks performed  Cognition [facility] for tasks performed  Cognitive Impairment delayed processing, decreased safety awareness  PT Assessment  Clinical Impression Statement Patient s/p R TKA presenting with dec HGB at 8.2  and currently receiving blood. Patient with good effort initially but unable to ambulate this date due to severe dizziness. Patient unable to achieve active R quad set and experience + R knee buckling. Patient to benefit from skilled PT while in hospital and upon d/c to maximize functional recovery for safe transition home.  PT Recommendation/Assessment Patient will need skilled PT in the acute care venue  PT  Problem List Decreased strength;Decreased range of motion;Decreased activity tolerance;Decreased balance;Decreased mobility;Decreased safety awareness  PT Therapy Diagnosis  Difficulty walking;Abnormality of gait;Generalized weakness  PT Plan  PT Frequency 7X/week  PT Treatment/Interventions DME instruction;Gait training;Stair training;Functional mobility training;Therapeutic activities;Therapeutic exercise  PT Recommendation  Follow Up Recommendations Home health PT;Supervision/Assistance - 24 hour (may need SNF placement if dizziness con't to limit mobility)  Equipment Recommended None recommended by PT (pt has recommended DME)  Individuals Consulted  Consulted and Agree with Results and Recommendations Patient  Acute Rehab PT Goals  PT Goal Formulation With patient  Time For Goal Achievement 7 days  Pt will go Supine/Side to Sit with modified independence;with HOB 0 degrees  PT Goal: Supine/Side to Sit - Progress Goal set today  Pt will go Sit to Stand with modified independence (up to RW)  PT Goal: Sit to Stand - Progress Goal set today  Pt will Transfer Bed to Chair/Chair to Bed with supervision (with RW)  PT Transfer Goal: Bed to Chair/Chair to Bed - Progress Goal set today  Pt will Ambulate 51 - 150 feet;with modified independence;with rolling walker  PT Goal: Ambulate - Progress Goal set today  Pt will Go Up / Down Stairs 3-5 stairs;with supervision;with rolling walker  PT Goal: Up/Down Stairs - Progress Goal set today  Pt will Perform Home Exercise Program Independently  PT Goal: Perform Home Exercise Program - Progress Goal set today    Pain: 5/10 R knee pain, however c/o of dizziness  Lewis Shock, PT, DPT Pager #: [phone] Office #: [phone]

## 2011-08-21 NOTE — Progress Notes (Signed)
Physical Therapy Treatment Note   08/21/11 1440  PT Visit Information  Last PT Received On 08/21/11  Precautions  Precautions Knee  Restrictions  RLE Weight Bearing WBAT  Bed Mobility  Bed Mobility (pt just returned to bed, supine ther ex)  Exercises  Exercises Total Joint (hand out provided)  Total Joint Exercises  Ankle Circles/Pumps AROM;Both;Supine;20 reps  Quad Sets AROM;Right;20 reps;Supine  Short Arc Quad AAROM;Right;10 reps;Supine  Heel Slides AAROM;Right;20 reps;Supine  Hip ABduction/ADduction AROM;Right;10 reps;Supine  PT - End of Session  Activity Tolerance Patient tolerated treatment well  Patient left in bed;in CPM;with call bell in reach;with family/visitor present (CPM set 0-80 deg, pt tolerateing well)  General  Behavior During Session [facility] for tasks performed  Cognition [facility] for tasks performed  PT - Assessment/Plan  Comments on Treatment Session Pt sat up for 4 hours and just returned to bed.Patient receiving second unit of blood. Pt remains to have difficulty with achieving good R quad contraction for quad set.  PT Plan Discharge plan remains appropriate;Frequency remains appropriate  PT Frequency 7X/week  Follow Up Recommendations Home health PT;Supervision/Assistance - 24 hour  Equipment Recommended None recommended by PT  Acute Rehab PT Goals  PT Goal: Perform Home Exercise Program - Progress Progressing toward goal    Pain: 4/10 R knee pain  Lewis Shock, PT, DPT Pager #: [phone] Office #: [phone]

## 2011-08-22 LAB — TYPE AND SCREEN: Unit division: 0

## 2011-08-22 LAB — CBC
HCT: 25 % — ABNORMAL LOW (ref 36.0–46.0)
Hemoglobin: 8.5 g/dL — ABNORMAL LOW (ref 12.0–15.0)
MCV: 85.6 fL (ref 78.0–100.0)
RBC: 2.92 MIL/uL — ABNORMAL LOW (ref 3.87–5.11)
RDW: 13.9 % (ref 11.5–15.5)
WBC: 10.6 10*3/uL — ABNORMAL HIGH (ref 4.0–10.5)

## 2011-08-22 LAB — BASIC METABOLIC PANEL
BUN: 14 mg/dL (ref 6–23)
CO2: 27 mEq/L (ref 19–32)
Chloride: 101 mEq/L (ref 96–112)
GFR calc Af Amer: 52 mL/min — ABNORMAL LOW (ref >90)
Potassium: 3.5 mEq/L (ref 3.5–5.1)

## 2011-08-22 LAB — GLUCOSE, CAPILLARY: Glucose-Capillary: 144 mg/dL — ABNORMAL HIGH (ref 70–99)

## 2011-08-22 MED ORDER — ENOXAPARIN SODIUM 40 MG/0.4ML ~~LOC~~ SOLN: 40.0000 mg | SUBCUTANEOUS | Status: DC

## 2011-08-22 MED ORDER — CELECOXIB 200 MG PO CAPS: 200.0000 mg | ORAL_CAPSULE | Freq: Two times a day (BID) | ORAL | Status: DC

## 2011-08-22 MED ORDER — OXYCODONE HCL 10 MG PO TB12: 10.0000 mg | ORAL_TABLET | Freq: Two times a day (BID) | ORAL | Status: DC

## 2011-08-22 MED ORDER — OXYCODONE HCL 5 MG PO TABS: 5.0000 mg | ORAL_TABLET | ORAL | Status: DC | PRN

## 2011-08-22 MED ORDER — METHOCARBAMOL 500 MG PO TABS: 500.0000 mg | ORAL_TABLET | Freq: Four times a day (QID) | ORAL | Status: DC | PRN

## 2011-08-22 NOTE — Discharge Instructions (Signed)
Diet: As you were doing prior to hospitalization   Activity:  Increase activity slowly as tolerated                  No lifting or driving for 6 weeks  Shower:  May shower without a dressing once there is no drainage from your wound.                 Do NOT wash over the wound.  If drainage remains, cover wound with saran                  Wrap and then shower.  Clean incision with betadine and change dressing                        After saran wrap removed.  Dressing:  You may change your dressing on Thursday                    Then change the dressing daily with sterile 4"x4"s gauze dressing                     And TED hose for knees.  Use paper tape to hold dressing in place                     For hips.  You may clean the incision with alcohol prior to redressing.  Weight Bearing:  Weight bearing as tolerated as taught in physical therapy.  Use a                                walker or Crutches as instructed.  To prevent constipation: you may use a stool softener such as -               Colace ( over the counter) 100 mg by mouth twice a day                Drink plenty of fluids ( prune juice may be helpful) and high fiber foods                Miralax ( over the counter) for constipation as needed.    Precautions:  If you experience chest pain or shortness of breath - call 911 immediately               For transfer to the hospital emergency department!!               If you develop a fever greater that 101 F, purulent drainage from wound,                             increased redness or drainage from wound, or calf pain -- Call the office at                                                 [phone].  Follow- Up Appointment:  Please call for an appointment to be seen on 09/04/11  [place] - [phone]                  Home Health to be provided by [facility] [phone]

## 2011-08-22 NOTE — Progress Notes (Signed)
  Georgena Spurling, MD   Altamese Cabal, PA-C [address] [place], [place], [place]  [address]                             [phone]   PROGRESS NOTE  Subjective:  negative for Chest Pain  negative for Shortness of Breath  negative for Nausea/Vomiting   negative for Calf Pain  negative for Bowel Movement   Tolerating Diet: yes         Patient reports pain as 4 on 0-10 scale.    Objective: Vital signs in last 24 hours:   Patient Vitals for the past 24 hrs:  BP Temp Temp src Pulse Resp SpO2  08/22/11 0540 106/62 mmHg 99.2 F (37.3 C) - 92  19  96 %  08/21/11 2215 138/55 mmHg 99.1 F (37.3 C) - 94  19  95 %  08/21/11 1730 111/38 mmHg 98.5 F (36.9 C) Oral 98  16  -  08/21/11 1630 106/35 mmHg 98.4 F (36.9 C) Oral 83  16  -  08/21/11 1530 114/37 mmHg 98.5 F (36.9 C) Oral 73  16  -  08/21/11 1430 92/43 mmHg 98 F (36.7 C) Oral 78  16  -  08/21/11 1405 97/34 mmHg 98.9 F (37.2 C) Oral 77  16  -    @flow {1959:LAST@   Intake/Output from previous day:   04/09 0701 - 04/10 0700 In: 2625 [P.O.:600; I.V.:1325] Out: 600 [Urine:400; Drains:200]   Intake/Output this shift:   04/10 0701 - 04/10 1900 In: 240 [P.O.:240] Out: -    Intake/Output      04/09 0701 - 04/10 0700 04/10 0701 - 04/11 0700   P.O. 600 240   I.V. (mL/kg) 1325 (15.1)    Blood 700    Total Intake(mL/kg) 2625 (30) 240 (2.7)   Urine (mL/kg/hr) 400 (0.2)    Drains 200    Blood     Total Output 600    Net +2025 +240        Urine Occurrence 3 x       LABORATORY DATA:  Basename 08/22/11 0620 08/21/11 0535  WBC 10.6* 8.3  HGB 8.5* 8.2*  HCT 25.0* 24.7*  PLT 112* 121*    Basename 08/22/11 0620 08/21/11 0535  NA 135 135  K 3.5 3.9  CL 101 102  CO2 27 28  BUN 14 13  CREATININE 1.15* 1.02  GLUCOSE 126* 137*  CALCIUM 8.4 8.6   Lab Results  Component Value Date   INR 0.97 08/13/2011   INR 0.9 11/18/2008    Examination:  General appearance: alert, cooperative and no distress Extremities: Homans  sign is negative, no sign of DVT  Wound Exam: clean, dry, intact   Drainage:  None: wound tissue dry  Motor Exam: EHL and FHL Intact  Sensory Exam: Deep Peroneal normal  Vascular Exam:    Assessment:    2 Days Post-Op  Procedure(s) (LRB): TOTAL KNEE ARTHROPLASTY (Right)  ADDITIONAL DIAGNOSIS:  Active Problems:  * No active hospital problems. *   Acute Blood Loss Anemia   Plan: Physical Therapy as ordered Weight Bearing as Tolerated (WBAT)  DVT Prophylaxis:  Lovenox  DISCHARGE PLAN: Home  DISCHARGE NEEDS: HHPT, CPM, Walker and 3-in-1 comode seat         Bliss Tsang 08/22/2011, 12:43 PM

## 2011-08-22 NOTE — Progress Notes (Signed)
CARE MANAGEMENT NOTE 08/22/2011  Patient:  Sabrina Hunt, Sabrina Hunt   Account Number:  [id number]  Date Initiated:  08/22/2011  Documentation initiated by:  Vance Peper  Subjective/Objective Assessment:   78 yr old female s/p right total knee arthroplasty     Action/Plan:   Spoke with patient regarding Home Health. Preoperatively setup with [facility], unfortunately they cant service the area she lives in. Offered choice to patient. Faxed info to [employer] office. Has DME at home.   Anticipated DC Date:  08/22/2011   Anticipated DC Plan:  HOME W HOME HEALTH SERVICES      DC Planning Services  CM consult      [facility] [place] Choice  HOME HEALTH   Choice offered to / List presented to:  C-1 Patient   DME arranged  NA        HH arranged  HH-2 PT      [facility] agency  [facility]   Status of service:  Completed, signed off   Discharge Disposition:  HOME W HOME HEALTH SERVICES

## 2011-08-22 NOTE — Progress Notes (Signed)
Physical Therapy Treatment Note   08/22/11 1334  PT Visit Information  Last PT Received On 08/22/11  Precautions  Precautions Knee;Fall  Restrictions  RLE Weight Bearing WBAT  Bed Mobility  Bed Mobility (pt received sitting on BSC)  Transfers  Sit to Stand 4: Min assist (contact guard)  Sit to Stand Details (indicate cue type and reason) v/c's for hand placement and R LE management  Ambulation/Gait  Ambulation/Gait Yes  Ambulation/Gait Assistance 4: Min assist (contact guard)  Ambulation/Gait Assistance Details (indicate cue type and reason) pt with good sequencing and no episodes of buckling  Ambulation Distance (Feet) 75 Feet  Assistive device Rolling walker  Gait Pattern Step-to pattern;Decreased step length - right;Decreased stance time - right;Antalgic  Gait velocity improved from yesterday  Stairs Yes  Stairs Assistance 3: Mod assist (due only having 1 hand rail on the right)  Stairs Assistance Details (indicate cue type and reason) spouse educated and returned demonstrated technique with max directional v/c's  Stair Management Technique One rail Right (L HHA)  Number of Stairs 2  (x2 trials)  Wheelchair Mobility  Wheelchair Mobility No  Posture/Postural Control  Posture/Postural Control No significant limitations  Balance  Balance Assessed No  PT - End of Session  Equipment Utilized During Treatment Gait belt  Activity Tolerance Patient tolerated treatment well  Patient left in chair (with OT)  Nurse Communication Mobility status for transfers;Mobility status for ambulation  General  Behavior During Session [facility] for tasks performed  Cognition (v/c's to stay on task, delayed processing)  Cognitive Impairment decreased safety awareness, decreased insight to deficits  PT - Assessment/Plan  Comments on Treatment Session Patient con't to have delayed processing and decreased insight to deficits, as well as spouse. Although spouse returned demonstrated technique PT  remains concerned re: spouse assisting patient on the steps due to max directional verbal cues. Patient unable to instruct spouse on how  to assist. Max verbal cues required to keep patient and spouse on task. Pt to benefit from additional night stay to improve transfer and ambulation ability and improved safety on the stairs to decrease fall risk upon d/c at home.  PT Plan Discharge plan remains appropriate;Frequency remains appropriate  PT Frequency 7X/week  Follow Up Recommendations Home health PT;Supervision/Assistance - 24 hour  Equipment Recommended None recommended by PT  Acute Rehab PT Goals  PT Goal: Sit to Stand - Progress Progressing toward goal  PT Goal: Ambulate - Progress Progressing toward goal  PT Goal: Up/Down Stairs - Progress Progressing toward goal  PT Goal: Perform Home Exercise Program - Progress Progressing toward goal     Pain: pt reports R knee to feel "not to bad."  Lewis Shock, PT, DPT Pager #: [phone] Office #: [phone]

## 2011-08-22 NOTE — Progress Notes (Deleted)
Physical Therapy Treatment Patient Details Name: Sabrina Hunt MRN: [id number] DOB: Sep 20, 1932 Today's Date: 08/22/2011  PT Assessment/Plan  PT - Assessment/Plan Comments on Treatment Session: Pt making progess with bed mobility and transfers. Pt able to put FWB on R knee without increase of pain. Pt stated she is ready to go home. Pt able to recall knee precaustion. Pt instructed to do AP and QS throughout day to prevent DVT and increase R LE strength.  PT Plan: Discharge plan remains appropriate;Frequency remains appropriate PT Frequency: 7X/week Follow Up Recommendations: Home health PT Equipment Recommended: None recommended by PT PT Goals  Acute Rehab PT Goals PT Goal: Supine/Side to Sit - Progress: Progressing toward goal PT Goal: Sit to Stand - Progress: Progressing toward goal PT Goal: Ambulate - Progress: Progressing toward goal  PT Treatment Precautions/Restrictions  Precautions Precautions: Knee;Fall Precaution Comments: Pt was able to recall all knee precaution  Required Braces or Orthoses: No Restrictions Weight Bearing Restrictions: Yes RLE Weight Bearing: Weight bearing as tolerated Mobility (including Balance) Bed Mobility Bed Mobility: Yes Supine to Sit: 4: Min assist Supine to Sit Details (indicate cue type and reason): pt required assistance to lift R LE  Transfers Transfers: Yes Sit to Stand: 4: Min assist Sit to Stand Details (indicate cue type and reason): Pt required VC's for hand and feet placement  Ambulation/Gait Ambulation/Gait: Yes Ambulation/Gait Assistance: 4: Min assist Ambulation/Gait Assistance Details (indicate cue type and reason): Pt required VC's for upright posture during amb. Ambulation Distance (Feet): 75 Feet Assistive device: Rolling walker Gait Pattern: Step-through pattern;Decreased stride length Stairs: No Wheelchair Mobility Wheelchair Mobility: No    Exercise  Total Joint Exercises Quad Sets: AROM;10 reps;Other reps  (comment) (3 sec hold ) Short Arc Quad: AROM;Right;10 reps;Other reps (comment) (with towel under R knee ) Heel Slides: AAROM;Right;10 reps;Other reps (comment) (3 sec hold ) End of Session PT - End of Session Equipment Utilized During Treatment: Gait belt Patient left: in chair;with call bell in reach Nurse Communication: Mobility status for ambulation  Tamera Stands 08/22/2011, 1:22 PM

## 2011-08-22 NOTE — Progress Notes (Signed)
Occupational Therapy Treatment Patient Details Name: Sabrina Hunt MRN: [id number] DOB: 1933/01/14 Today's Date: 08/22/2011  OT Assessment/Plan OT Assessment/Plan Comments on Treatment Session: Pt. moving well and anticipates D/C today OT Plan: Discharge plan remains appropriate OT Frequency: Min 2X/week Follow Up Recommendations: Home health OT;Supervision - Intermittent Equipment Recommended: None recommended by OT OT Goals Acute Rehab OT Goals OT Goal Formulation: With patient Time For Goal Achievement: 7 days ADL Goals Pt Will Perform Grooming: with set-up;with supervision;Standing at sink ADL Goal: Grooming - Progress: Met Pt Will Perform Lower Body Bathing: with set-up;with supervision;Sit to stand from chair;with adaptive equipment ADL Goal: Lower Body Bathing - Progress: Met Pt Will Perform Lower Body Dressing: with set-up;with supervision;with adaptive equipment;Sit to stand from chair ADL Goal: Lower Body Dressing - Progress: Met Pt Will Transfer to Toilet: with supervision;with DME;Ambulation;3-in-1 ADL Goal: Toilet Transfer - Progress: Met Pt Will Perform Tub/Shower Transfer: Shower transfer;with supervision;Ambulation;with DME ADL Goal: Tub/Shower Transfer - Progress: Progressing toward goals  OT Treatment Precautions/Restrictions  Precautions Precautions: Knee;Fall Precaution Comments: Pt was able to recall all knee precaution  Required Braces or Orthoses: No Restrictions Weight Bearing Restrictions: Yes RLE Weight Bearing: Weight bearing as tolerated   ADL ADL Grooming: Performed;Wash/dry face;Set up;Supervision/safety Where Assessed - Grooming: Standing at sink Lower Body Bathing: Simulated;Set up;Supervision/safety Lower Body Bathing Details (indicate cue type and reason): with use of long handled sponge Where Assessed - Lower Body Bathing: Sit to stand from chair Lower Body Dressing: Performed;Set up;Supervision/safety Lower Body Dressing Details  (indicate cue type and reason): With doffing bil shoes Where Assessed - Lower Body Dressing: Sitting, bed Toilet Transfer: Performed;Minimal assistance Toilet Transfer Details (indicate cue type and reason): Min verbal cues for safe hand placement  Equipment Used: Rolling walker Ambulation Related to ADLs: Close supervision ~15' ADL Comments: Pt. provided with demonstration on shower transfer technique with posterior entrance and educated on technique for safe use with RW. pt. able to verbalize techniques for completing shower transfer and LB ADLs. Mobility  Bed Mobility Bed Mobility: Yes Supine to Sit: 5: Supervision;HOB flat Supine to Sit Details (indicate cue type and reason): No assist needed or verbal cues Transfers Transfers: Yes Sit to Stand: 5: Supervision;With armrests;From chair/3-in-1 Sit to Stand Details (indicate cue type and reason): Pt required VC's for hand and feet placement  Stand to Sit: 4: Min assist;To chair/3-in-1 Stand to Sit Details: cues for safe technique    End of Session OT - End of Session Equipment Utilized During Treatment: Gait belt Activity Tolerance: Patient tolerated treatment well Patient left: in chair;with call bell in reach Nurse Communication: Mobility status for transfers General Behavior During Session: [facility] for tasks performed Cognition: [facility] for tasks performed  Joei Frangos, OTR/L Pager [phone]  08/22/2011, 2:42 PM

## 2011-08-22 NOTE — Progress Notes (Signed)
08/22/2011 Cassiel Fernandez Elizabeth PTA [phone] pager [phone] office    

## 2011-08-22 NOTE — Discharge Summary (Signed)
PATIENT ID:      Sabrina Hunt  MRN:     [id number] DOB/AGE:    [birth year] / 78 y.o.     DISCHARGE SUMMARY  ADMISSION DATE:    08/20/2011 DISCHARGE DATE:   08/22/2011   ADMISSION DIAGNOSIS: osteoarthritis right knee  (osteoarthritis right knee)  DISCHARGE DIAGNOSIS:  osteoarthritis right knee    ADDITIONAL DIAGNOSIS: Active Problems:  * No active hospital problems. *   Past Medical History  Diagnosis Date  . Hypertension   . Constipation   . Diabetes mellitus   . Left ankle injury     Swelling  . Anemia   . Hyperlipidemia   . Arthritis   . Neuromuscular disorder   . Blood transfusion     2010    PROCEDURE: Procedure(s): TOTAL KNEE ARTHROPLASTY on 08/20/2011  CONSULTS:     HISTORY:  See H&P in chart  HOSPITAL COURSE:  Sabrina Hunt is a 78 y.o. admitted on 08/20/2011 and found to have a diagnosis of osteoarthritis right knee.  After appropriate laboratory studies were obtained  they were taken to the operating room on 08/20/2011 and underwent Procedure(s): TOTAL KNEE ARTHROPLASTY.   They were given perioperative antibiotics:  Anti-infectives     Start     Dose/Rate Route Frequency Ordered Stop   08/20/11 2000   vancomycin (VANCOCIN) IVPB 1000 mg/200 mL premix        1,000 mg 200 mL/hr over 60 Minutes Intravenous Every 12 hours 08/20/11 1333 08/20/11 2100   08/19/11 1423   vancomycin (VANCOCIN) IVPB 1000 mg/200 mL premix        1,000 mg 200 mL/hr over 60 Minutes Intravenous 60 min pre-op 08/19/11 1423 08/20/11 0843        .  Tolerated the procedure well.  Placed with a foley intraoperatively.  Given Ofirmev at induction and for 48 hours.  PCA for analgesia.   POD #1, allowed out of bed to a chair.  PT for ambulation and exercise program.  Foley D/C'd in morning.  IV saline locked.  O2 discontionued.  POD #2, continued PT and ambulation .  The remainder of the hospital course was dedicated to ambulation and strengthening.   The patient was discharged on 2 Days  Post-Op in  Good condition.  Blood products given:none  DIAGNOSTIC STUDIES: Recent vital signs: Patient Vitals for the past 24 hrs:  BP Temp Temp src Pulse Resp SpO2  08/22/11 0540 106/62 mmHg 99.2 F (37.3 C) - 92  19  96 %  08/26/11 2215 138/55 mmHg 99.1 F (37.3 C) - 94  19  95 %  2011-08-26 1730 111/38 mmHg 98.5 F (36.9 C) Oral 98  16  -  08-26-11 1630 106/35 mmHg 98.4 F (36.9 C) Oral 83  16  -  08-26-11 1530 114/37 mmHg 98.5 F (36.9 C) Oral 73  16  -  08/26/2011 1430 92/43 mmHg 98 F (36.7 C) Oral 78  16  -  Aug 26, 2011 1405 97/34 mmHg 98.9 F (37.2 C) Oral 77  16  -       Recent laboratory studies:  [facility] 08/22/11 0620 08-26-2011 0535  WBC 10.6* 8.3  HGB 8.5* 8.2*  HCT 25.0* 24.7*  PLT 112* 121*    Basename 08/22/11 0620 2011-08-26 0535  NA 135 135  K 3.5 3.9  CL 101 102  CO2 27 28  BUN 14 13  CREATININE 1.15* 1.02  GLUCOSE 126* 137*  CALCIUM 8.4 8.6   Lab Results  Component Value Date   INR 0.97 08/13/2011   INR 0.9 11/18/2008     Recent Radiographic Studies :  Dg Chest 2 View  08/13/2011  *RADIOLOGY REPORT*  Clinical Data: Preop.  CHEST - 2 VIEW  Comparison: 11/18/2008  Findings: There is hyperinflation of the lungs compatible with COPD.  Heart and mediastinal contours are within normal limits.  No focal opacities or effusions.  No acute bony abnormality.  IMPRESSION: COPD.  No acute findings.  Original Report Authenticated By: Cyndie Chime, M.D.    DISCHARGE INSTRUCTIONS: Discharge Orders    Future Orders Please Complete By Expires   Diet - low sodium heart healthy      Call MD / Call 911      Comments:   If you experience chest pain or shortness of breath, CALL 911 and be transported to the hospital emergency room.  If you develope a fever above 101 F, pus (white drainage) or increased drainage or redness at the wound, or calf pain, call your surgeon's office.   Constipation Prevention      Comments:   Drink plenty of fluids.  Prune juice may be helpful.   You may use a stool softener, such as Colace (over the counter) 100 mg twice a day.  Use MiraLax (over the counter) for constipation as needed.   Increase activity slowly as tolerated      Weight Bearing as taught in Physical Therapy      Comments:   Use a walker or crutches as instructed.   Driving restrictions      Comments:   No driving for 6 weeks   Lifting restrictions      Comments:   No lifting for 6 weeks   CPM      Comments:   Continuous passive motion machine (CPM):      Use the CPM from 0 to 90 for 6-8 hours per day.      You may increase by 10 per day.  You may break it up into 2 or 3 sessions per day.      Use CPM for 2 weeks or until you are told to stop.   TED hose      Comments:   Use stockings (TED hose) for 3 weeks on both leg(s).  You may remove them at night for sleeping.   Change dressing      Comments:   Change dressing on thursday, then change the dressing daily with sterile 4 x 4 inch gauze dressing and apply TED hose.  You may clean the incision with alcohol prior to redressing.   Do not put a pillow under the knee. Place it under the heel.         DISCHARGE MEDICATIONS:   Medication List  As of 08/22/2011 12:52 PM   STOP taking these medications         aspirin EC 81 MG tablet         TAKE these medications         CALTRATE 600 PLUS-VIT D PO   Take 1 tablet by mouth 2 (two) times daily.      celecoxib 200 MG capsule   Commonly known as: CELEBREX   Take 1 capsule (200 mg total) by mouth 2 (two) times daily.      enoxaparin 40 MG/0.4ML injection   Commonly known as: LOVENOX   Inject 0.4 mLs (40 mg total) into the skin daily.      gabapentin 300 MG  capsule   Commonly known as: NEURONTIN   Take 300 mg by mouth at bedtime.      losartan 100 MG tablet   Commonly known as: COZAAR   Take 100 mg by mouth daily.      methocarbamol 500 MG tablet   Commonly known as: ROBAXIN   Take 1-2 tablets (500-1,000 mg total) by mouth every 6 (six) hours as  needed.      mulitivitamin with minerals Tabs   Take 1 tablet by mouth daily.      oxyCODONE 5 MG immediate release tablet   Commonly known as: Oxy IR/ROXICODONE   Take 1-2 tablets (5-10 mg total) by mouth every 4 (four) hours as needed.      oxyCODONE 10 MG 12 hr tablet   Commonly known as: OXYCONTIN   Take 1 tablet (10 mg total) by mouth every 12 (twelve) hours.      rosuvastatin 20 MG tablet   Commonly known as: CRESTOR   Take 20 mg by mouth daily.      sitaGLIPtin 50 MG tablet   Commonly known as: JANUVIA   Take 50 mg by mouth daily.      VITAMIN D-3 PO   Take 1 capsule by mouth daily.            FOLLOW UP VISIT:   Follow-up Information    Follow up with Raymon Mutton, MD. Call on 09/04/2011.   Contact information:   [address] [place] [address] [phone]          DISPOSITION:  Home    CONDITION:  Good   Sabrina Hunt 08/22/2011, 12:52 PM

## 2011-08-22 NOTE — Progress Notes (Signed)
Physical Therapy Treatment Patient Details Name: Sabrina Hunt MRN: [id number] DOB: 1932/08/20 Today's Date: 08/22/2011  PT Assessment/Plan  PT - Assessment/Plan Comments on Treatment Session: Pt making progess with bed mobility and transfers. Pt able to put FWB on R knee without increase of pain. Pt stated she is ready to go home. Pt able to recall knee precaustion. Pt instructed to do AP and QS throughout day to prevent DVT and increase R LE strength.  PT Plan: Discharge plan remains appropriate;Frequency remains appropriate PT Frequency: 7X/week Follow Up Recommendations: Home health PT Equipment Recommended: None recommended by PT PT Goals  Acute Rehab PT Goals PT Goal: Supine/Side to Sit - Progress: Progressing toward goal PT Goal: Sit to Stand - Progress: Progressing toward goal PT Goal: Ambulate - Progress: Progressing toward goal  PT Treatment Precautions/Restrictions  Precautions Precautions: Knee;Fall Precaution Comments: Pt was able to recall all knee precaution  Required Braces or Orthoses: No Restrictions Weight Bearing Restrictions: Yes RLE Weight Bearing: Weight bearing as tolerated Mobility (including Balance) Bed Mobility Bed Mobility: Yes Supine to Sit: 4: Min assist Supine to Sit Details (indicate cue type and reason): pt required assistance to lift R LE  Transfers Transfers: Yes Sit to Stand: 1: +2 Total assist;From bed;With upper extremity assist (Pt=70%) Sit to Stand Details (indicate cue type and reason): Pt required VC's for hand and feet placement  Stand to Sit: 4: Min assist;To chair/3-in-1 Stand to Sit Details: cues for safe technique Ambulation/Gait Ambulation/Gait: Yes Ambulation/Gait Assistance: 4: Min assist Ambulation/Gait Assistance Details (indicate cue type and reason): Pt required VC's for upright posture during amb. Ambulation Distance (Feet): 75 Feet Assistive device: Rolling walker Gait Pattern: Step-through pattern;Decreased stride  length Stairs: No Wheelchair Mobility Wheelchair Mobility: No    Exercise  Total Joint Exercises Quad Sets: AROM;10 reps;Other reps (comment) (3 sec hold ) Short Arc Quad: AROM;Right;10 reps;Other reps (comment) (with towel under R knee ) Heel Slides: AAROM;Right;10 reps;Other reps (comment) (3 sec hold ) End of Session PT - End of Session Equipment Utilized During Treatment: Gait belt Patient left: in chair;with call bell in reach Nurse Communication: Mobility status for ambulation  Judith Blonder 08/22/2011, 1:26 PM

## 2011-08-23 DIAGNOSIS — R269 Unspecified abnormalities of gait and mobility: Secondary | ICD-10-CM | POA: Diagnosis not present

## 2011-08-23 DIAGNOSIS — Z471 Aftercare following joint replacement surgery: Secondary | ICD-10-CM | POA: Diagnosis not present

## 2011-08-23 DIAGNOSIS — I10 Essential (primary) hypertension: Secondary | ICD-10-CM | POA: Diagnosis not present

## 2011-08-23 DIAGNOSIS — IMO0001 Reserved for inherently not codable concepts without codable children: Secondary | ICD-10-CM | POA: Diagnosis not present

## 2011-08-23 DIAGNOSIS — Z96659 Presence of unspecified artificial knee joint: Secondary | ICD-10-CM | POA: Diagnosis not present

## 2011-08-23 DIAGNOSIS — E119 Type 2 diabetes mellitus without complications: Secondary | ICD-10-CM | POA: Diagnosis not present

## 2011-08-23 NOTE — Op Note (Signed)
TOTAL KNEE REPLACEMENT OPERATIVE NOTE:  08/20/2011  1:18 PM  PATIENT:  Sabrina Hunt  78 y.o. female  PRE-OPERATIVE DIAGNOSIS:  osteoarthritis right knee  POST-OPERATIVE DIAGNOSIS:  Osteoarthritis right knee  PROCEDURE:  Procedure(s): TOTAL KNEE ARTHROPLASTY  SURGEON:  Surgeon(s): Raymon Mutton, MD  PHYSICIAN ASSISTANT: Altamese Cabal, [facility]  ANESTHESIA:   general  DRAINS: Hemovac and On-Q Marcaine Pain Pump  SPECIMEN: None  COUNTS:  Correct  TOURNIQUET:   Total Tourniquet Time Documented: Thigh (Right) - 59 minutes  DICTATION:  Indication for procedure:    The patient is a 78 y.o. female who has failed conservative treatment for osteoarthritis right knee.  Informed consent was obtained prior to anesthesia. The risks versus benefits of the operation were explain and in a way the patient can, and did, understand.   Description of procedure:     The patient was taken to the operating room and placed under anesthesia.  The patient was positioned in the usual fashion taking care that all body parts were adequately padded and/or protected.  I foley catheter was placed.  A tourniquet was applied and the leg prepped and draped in the usual sterile fashion.  The extremity was exsanguinated with the esmarch and tourniquet inflated to 350 mmHg.  Pre-operative range of motion was normal.  The knee was in 5 degree of mild valgus.  A midline incision approximately 6-7 inches long was made with a #10 blade.  A new blade was used to make a parapatellar arthrotomy going 2-3 cm into the quadriceps tendon, over the patella, and alongside the medial aspect of the patellar tendon.  A synovectomy was then performed with the #10 blade and forceps. I then elevated the deep MCL off the medial tibial metaphysis subperiosteally around to the semimembranosus attachment.    I everted the patella and used calipers to measure patellar thickness.  I used the reamer to ream down to appropriate thickness to  recreate the native thickness.  I then removed excess bone with the rongeur and sagittal saw.  I used the appropriately sized template and drilled the three lug holes.  I then put the trial in place and measured the thickness with the calipers to ensure recreation of the native thickness.  The trial was then removed and the patella subluxed and the knee brought into flexion.  A homan retractor was place to retract and protect the patella and lateral structures.  A Z-retractor was place medially to protect the medial structures.  The extra-medullary alignment system was used to make cut the tibial articular surface perpendicular to the anamotic axis of the tibia and in 3 degrees of posterior slope.  The cut surface and alignment jig was removed.  I then used the intramedullary alignment guide to make a 4 valgus cut on the distal femur.  I then marked out the epicondylar axis on the distal femur.  The posterior condylar axis measured 3 degrees.  I then used the anterior referencing sizer and measured the femur to be a size D.  The 4-In-1 cutting block was screwed into place in external rotation matching the posterior condylar angle, making our cuts perpendicular to the epicondylar axis.  Anterior, posterior and chamfer cuts were made with the sagittal saw.  The cutting block and cut pieces were removed.  A lamina spreader was placed in 90 degrees of flexion.  The ACL, PCL, menisci, and posterior condylar osteophytes were removed.  A 10 mm spacer blocked was found to offer good flexion  and extension gap balance after minimal in degree releasing.   The scoop retractor was then placed and the femoral finishing block was pinned in place.  The small sagittal saw was used as well as the lug drill to finish the femur.  The block and cut surfaces were removed and the medullary canal hole filled with autograft bone from the cut pieces.  The tibia was delivered forward in deep flexion and external rotation.  A size 5  tray was selected and pinned into place centered on the medial 1/3 of the tibial tubercle.  The reamer and keel was used to prepare the tibia through the tray.    I then trialed with the size D femur, size 5 tibia, a 10 mm insert and the 32 patella.  I had excellent flexion/extension gap balance, excellent patella tracking.  Flexion was full and beyond 120 degrees; extension was zero.  These components were chosen and the staff opened them to me on the back table while the knee was lavaged copiously and the cement mixed.  I cemented in the components and removed all excess cement.  The polyethylene tibial component was snapped into place and the knee placed in extension while cement was hardening.  The capsule was infilltrated with 20cc of .25% Marcaine with epinephrine.  A hemovac was place in the joint exiting superolaterally.  A pain pump was place superomedially superficial to the arthrotomy.  Once the cement was hard, the tourniquet was let down.  Hemostasis was obtained.  The arthrotomy was closed with figure-8 #1 vicryl sutures.  The deep soft tissues were closed with #0 vicryls and the subcuticular layer closed with a running #2-0 vicryl.  The skin was reapproximated and closed with skin staples.  The wound was dressed with xeroform, 4 x4's, 2 ABD sponges, a single layer of webril and a TED stocking.   The patient was then awakened, extubated, and taken to the recovery room in stable condition.  BLOOD LOSS:  300cc DRAINS: 1 hemovac, 1 pain catheter COMPLICATIONS:  None.  PLAN OF CARE: Admit to inpatient   PATIENT DISPOSITION:  PACU - hemodynamically stable.   Delay start of Pharmacological VTE agent (>24hrs) due to surgical blood loss or risk of bleeding:  not applicable  Please fax a copy of this op note to my office at [phone] (please only include page 1 and 2 of the Case Information op note)

## 2011-08-25 ENCOUNTER — Encounter (HOSPITAL_COMMUNITY): Payer: Self-pay

## 2011-08-25 ENCOUNTER — Inpatient Hospital Stay (HOSPITAL_COMMUNITY)
Admission: EM | Admit: 2011-08-25 | Discharge: 2011-08-27 | DRG: 812 | Disposition: A | Discharge status: Home / Self Care | Payer: Medicare Other | Attending: Internal Medicine | Admitting: Internal Medicine

## 2011-08-25 ENCOUNTER — Emergency Department (HOSPITAL_COMMUNITY): Payer: Medicare Other

## 2011-08-25 DIAGNOSIS — E785 Hyperlipidemia, unspecified: Secondary | ICD-10-CM | POA: Diagnosis present

## 2011-08-25 DIAGNOSIS — D649 Anemia, unspecified: Secondary | ICD-10-CM

## 2011-08-25 DIAGNOSIS — Z96659 Presence of unspecified artificial knee joint: Secondary | ICD-10-CM

## 2011-08-25 DIAGNOSIS — Z6830 Body mass index (BMI) 30.0-30.9, adult: Secondary | ICD-10-CM

## 2011-08-25 DIAGNOSIS — Z91040 Latex allergy status: Secondary | ICD-10-CM

## 2011-08-25 DIAGNOSIS — D500 Iron deficiency anemia secondary to blood loss (chronic): Secondary | ICD-10-CM | POA: Diagnosis present

## 2011-08-25 DIAGNOSIS — M25569 Pain in unspecified knee: Secondary | ICD-10-CM | POA: Diagnosis present

## 2011-08-25 DIAGNOSIS — E119 Type 2 diabetes mellitus without complications: Secondary | ICD-10-CM | POA: Diagnosis present

## 2011-08-25 DIAGNOSIS — I10 Essential (primary) hypertension: Secondary | ICD-10-CM

## 2011-08-25 DIAGNOSIS — M129 Arthropathy, unspecified: Secondary | ICD-10-CM | POA: Diagnosis present

## 2011-08-25 DIAGNOSIS — M170 Bilateral primary osteoarthritis of knee: Secondary | ICD-10-CM

## 2011-08-25 DIAGNOSIS — I2510 Atherosclerotic heart disease of native coronary artery without angina pectoris: Secondary | ICD-10-CM | POA: Diagnosis not present

## 2011-08-25 DIAGNOSIS — Z888 Allergy status to other drugs, medicaments and biological substances status: Secondary | ICD-10-CM | POA: Diagnosis not present

## 2011-08-25 DIAGNOSIS — E1142 Type 2 diabetes mellitus with diabetic polyneuropathy: Secondary | ICD-10-CM

## 2011-08-25 DIAGNOSIS — R0602 Shortness of breath: Secondary | ICD-10-CM | POA: Diagnosis present

## 2011-08-25 DIAGNOSIS — R918 Other nonspecific abnormal finding of lung field: Secondary | ICD-10-CM | POA: Diagnosis not present

## 2011-08-25 DIAGNOSIS — R0600 Dyspnea, unspecified: Secondary | ICD-10-CM

## 2011-08-25 DIAGNOSIS — Z79899 Other long term (current) drug therapy: Secondary | ICD-10-CM | POA: Diagnosis not present

## 2011-08-25 HISTORY — DX: Bilateral primary osteoarthritis of knee: M17.0

## 2011-08-25 HISTORY — DX: Essential (primary) hypertension: I10

## 2011-08-25 HISTORY — DX: Shortness of breath: R06.02

## 2011-08-25 HISTORY — DX: Hyperlipidemia, unspecified: E78.5

## 2011-08-25 HISTORY — DX: Type 2 diabetes mellitus with diabetic polyneuropathy: E11.42

## 2011-08-25 HISTORY — DX: Anemia, unspecified: D64.9

## 2011-08-25 LAB — URINALYSIS, ROUTINE W REFLEX MICROSCOPIC
Bilirubin Urine: NEGATIVE
Glucose, UA: NEGATIVE mg/dL
Hgb urine dipstick: NEGATIVE
Ketones, ur: NEGATIVE mg/dL
Leukocytes, UA: NEGATIVE
Nitrite: NEGATIVE
Protein, ur: NEGATIVE mg/dL
Specific Gravity, Urine: 1.01 (ref 1.005–1.030)
Urobilinogen, UA: 1 mg/dL (ref 0.0–1.0)
pH: 6.5 (ref 5.0–8.0)

## 2011-08-25 LAB — POCT I-STAT, CHEM 8
BUN: 16 mg/dL (ref 6–23)
Calcium, Ion: 1.15 mmol/L (ref 1.12–1.32)
Chloride: 104 mEq/L (ref 96–112)
Creatinine, Ser: 1.1 mg/dL (ref 0.50–1.10)
Glucose, Bld: 133 mg/dL — ABNORMAL HIGH (ref 70–99)
HCT: 23 % — ABNORMAL LOW (ref 36.0–46.0)
Hemoglobin: 7.8 g/dL — ABNORMAL LOW (ref 12.0–15.0)
Potassium: 3.3 mEq/L — ABNORMAL LOW (ref 3.5–5.1)
Sodium: 139 mEq/L (ref 135–145)
TCO2: 24 mmol/L (ref 0–100)

## 2011-08-25 LAB — DIFFERENTIAL
Basophils Relative: 0 % (ref 0–1)
Eosinophils Absolute: 0 10*3/uL (ref 0.0–0.7)
Monocytes Absolute: 0.6 10*3/uL (ref 0.1–1.0)
Monocytes Relative: 8 % (ref 3–12)

## 2011-08-25 LAB — CBC
HCT: 22.9 % — ABNORMAL LOW (ref 36.0–46.0)
Hemoglobin: 7.8 g/dL — ABNORMAL LOW (ref 12.0–15.0)
MCH: 29 pg (ref 26.0–34.0)
MCHC: 34.1 g/dL (ref 30.0–36.0)

## 2011-08-25 LAB — GLUCOSE, CAPILLARY

## 2011-08-25 LAB — TROPONIN I: Troponin I: <0.3 ng/mL (ref <0.30)

## 2011-08-25 MED ORDER — ENOXAPARIN SODIUM 40 MG/0.4ML ~~LOC~~ SOLN
40.0000 mg | SUBCUTANEOUS | Status: DC
  Administered 2011-08-25 – 2011-08-26 (×2): 40 mg via SUBCUTANEOUS
  Filled 2011-08-25 (×3): qty 0.4

## 2011-08-25 MED ORDER — ADULT MULTIVITAMIN W/MINERALS CH
1.0000 | ORAL_TABLET | Freq: Every day | ORAL | Status: DC
  Administered 2011-08-26 – 2011-08-27 (×2): 1 via ORAL
  Filled 2011-08-25 (×2): qty 1

## 2011-08-25 MED ORDER — GABAPENTIN 300 MG PO CAPS
300.0000 mg | ORAL_CAPSULE | Freq: Every day | ORAL | Status: DC
  Administered 2011-08-25 – 2011-08-26 (×2): 300 mg via ORAL
  Filled 2011-08-25 (×3): qty 1

## 2011-08-25 MED ORDER — SODIUM CHLORIDE 0.9 % IV SOLN
INTRAVENOUS | Status: DC
  Administered 2011-08-25: 13:00:00 via INTRAVENOUS

## 2011-08-25 MED ORDER — PANTOPRAZOLE SODIUM 40 MG PO TBEC
40.0000 mg | DELAYED_RELEASE_TABLET | Freq: Every day | ORAL | Status: DC
  Administered 2011-08-25 – 2011-08-27 (×3): 40 mg via ORAL
  Filled 2011-08-25 (×3): qty 1

## 2011-08-25 MED ORDER — ATORVASTATIN CALCIUM 40 MG PO TABS
40.0000 mg | ORAL_TABLET | Freq: Every day | ORAL | Status: DC
  Administered 2011-08-25 – 2011-08-26 (×2): 40 mg via ORAL
  Filled 2011-08-25 (×3): qty 1

## 2011-08-25 MED ORDER — SODIUM CHLORIDE 0.9 % IV SOLN
INTRAVENOUS | Status: DC
  Administered 2011-08-25: 18:00:00 via INTRAVENOUS
  Administered 2011-08-26: 75 mL via INTRAVENOUS

## 2011-08-25 MED ORDER — OXYCODONE HCL 5 MG PO TABS: 5.0000 mg | ORAL_TABLET | ORAL | Status: DC | PRN

## 2011-08-25 MED ORDER — CELECOXIB 200 MG PO CAPS
200.0000 mg | ORAL_CAPSULE | Freq: Two times a day (BID) | ORAL | Status: DC
  Filled 2011-08-25: qty 1

## 2011-08-25 MED ORDER — ACETAMINOPHEN 500 MG PO TABS
500.0000 mg | ORAL_TABLET | Freq: Four times a day (QID) | ORAL | Status: DC | PRN
  Administered 2011-08-26: 500 mg via ORAL
  Filled 2011-08-25: qty 1

## 2011-08-25 MED ORDER — METHOCARBAMOL 500 MG PO TABS
500.0000 mg | ORAL_TABLET | Freq: Four times a day (QID) | ORAL | Status: DC | PRN
  Filled 2011-08-25: qty 2

## 2011-08-25 MED ORDER — IOHEXOL 350 MG/ML SOLN
50.0000 mL | Freq: Once | INTRAVENOUS | Status: AC | PRN
  Administered 2011-08-25: 50 mL via INTRAVENOUS

## 2011-08-25 NOTE — ED Provider Notes (Signed)
  Physical Exam  BP 134/45  Pulse 82  Temp(Src) 98.3 F (36.8 C) (Oral)  Resp 18  SpO2 100%  Physical Exam  ED Course  Procedures  MDM Patient was sent in from [facility] to rule out pulmonary embolism after knee replacement. She's had dyspnea. She also states that she is still fatigued and had lower abdominal pain. Possible fevers at home. She will be moved to an exam from further evaluation      Juliet Rude. Rubin Payor, MD 08/25/11 1145

## 2011-08-25 NOTE — ED Provider Notes (Signed)
Medical screening examination/treatment/procedure(s) were conducted as a shared visit with non-physician practitioner(s) and myself.  I personally evaluated the patient during the encounter 39 y female had right knee surg. 4 d ago. Now has incr. Sob.  No pain. No fever, chills, n/v.  No distress. Sent here for eval. Poss. Pe. No pe but + worse anemia.  Will admit for transfusion.  Cheri Guppy, MD 08/25/11 [phone]

## 2011-08-25 NOTE — ED Provider Notes (Signed)
History     CSN: [id number]  Arrival date & time 08/25/11  1121   First MD Initiated Contact with Patient 08/25/11 1139      Chief Complaint  Patient presents with  . Shortness of Breath    (Consider location/radiation/quality/duration/timing/severity/associated sxs/prior treatment) Patient is a 78 y.o. female presenting with shortness of breath. The history is provided by the patient.  Shortness of Breath  The current episode started 2 days ago. The problem has been gradually worsening. Associated symptoms include shortness of breath. Pertinent negatives include no chest pain, no fever and no cough. Associated symptoms comments: She had knee replacement surgery 4 days ago and has been progressively short of breath since that time. She reports a fever of up to 101. Denies cough. No nausea or vomiting. Other than shortness of breath, she reports she feels fine. . There were no sick contacts. Recently, medical care has been given by a specialist.    Past Medical History  Diagnosis Date  . Hypertension   . Constipation   . Diabetes mellitus   . Left ankle injury     Swelling  . Anemia   . Hyperlipidemia   . Arthritis   . Neuromuscular disorder   . Blood transfusion     2010    Past Surgical History  Procedure Date  . Tonsillectomy   . Appendectomy   . Abdominal hysterectomy   . Rotator cuff repair     Right  . Joint replacement 11/2008    Left Knee Replacement  . Carpal tunnel release     Left  . Colonscopy 2004  . Total knee arthroplasty 08/20/2011    Procedure: TOTAL KNEE ARTHROPLASTY;  Surgeon: Raymon Mutton, MD;  Location: [facility] OR;  Service: Orthopedics;  Laterality: Right;    Family History  Problem Relation Age of Onset  . Anesthesia problems Neg Hx     History  Substance Use Topics  . Smoking status: Never Smoker   . Smokeless tobacco: Not on file  . Alcohol Use: No    OB History    Grav Para Term Preterm Abortions TAB SAB Ect Mult Living        Review of Systems  Constitutional: Negative for fever and chills.  HENT: Negative.   Respiratory: Positive for shortness of breath. Negative for cough.   Cardiovascular: Negative.  Negative for chest pain.  Gastrointestinal: Negative.   Musculoskeletal: Negative.   Skin: Negative.   Neurological: Negative.     Allergies  Amoxicillin; Hydrocodone; Latex; and Prednisone  Home Medications   Current Outpatient Rx  Name Route Sig Dispense Refill  . ACETAMINOPHEN 500 MG PO TABS Oral Take 500 mg by mouth every 6 (six) hours as needed. FOR PAIN    . CALTRATE 600 PLUS-VIT D PO Oral Take 1 tablet by mouth 2 (two) times daily.    . CELECOXIB 200 MG PO CAPS Oral Take 200 mg by mouth 2 (two) times daily.    Marland Kitchen VITAMIN D-3 PO Oral Take 1 capsule by mouth daily.    Marland Kitchen ENOXAPARIN SODIUM 40 MG/0.4ML  SOLN Subcutaneous Inject 40 mg into the skin daily.    Marland Kitchen GABAPENTIN 300 MG PO CAPS Oral Take 300 mg by mouth at bedtime.    Marland Kitchen LOSARTAN POTASSIUM 100 MG PO TABS Oral Take 100 mg by mouth daily.    Marland Kitchen METHOCARBAMOL 500 MG PO TABS Oral Take 500-1,000 mg by mouth every 6 (six) hours as needed.    . ADULT MULTIVITAMIN  W/MINERALS CH Oral Take 1 tablet by mouth daily.    . OXYCODONE HCL 5 MG PO TABS Oral Take 5-10 mg by mouth every 4 (four) hours as needed.    Marland Kitchen ROSUVASTATIN CALCIUM 20 MG PO TABS Oral Take 20 mg by mouth daily.    Marland Kitchen SITAGLIPTIN PHOSPHATE 50 MG PO TABS Oral Take 50 mg by mouth daily.      BP 143/46  Pulse 85  Temp(Src) 99 F (37.2 C) (Oral)  Resp 18  SpO2 100%  Physical Exam  Constitutional: She appears well-developed and well-nourished.  HENT:  Head: Normocephalic.  Neck: Normal range of motion. Neck supple.  Cardiovascular: Normal rate and regular rhythm.   No murmur heard. Pulmonary/Chest: Effort normal and breath sounds normal. She has no wheezes. She has no rales.  Abdominal: Soft. Bowel sounds are normal. There is no tenderness. There is no rebound and no guarding.   Musculoskeletal: Normal range of motion.  Neurological: She is alert. No cranial nerve deficit.  Skin: Skin is warm and dry. No rash noted.  Psychiatric: She has a normal mood and affect.    ED Course  Procedures (including critical care time)  Labs Reviewed  CBC - Abnormal; Notable for the following:    RBC 2.69 (*)    Hemoglobin 7.8 (*)    HCT 22.9 (*)    All other components within normal limits  DIFFERENTIAL - Abnormal; Notable for the following:    Neutrophils Relative 78 (*)    All other components within normal limits  POCT I-STAT, CHEM 8 - Abnormal; Notable for the following:    Potassium 3.3 (*)    Glucose, Bld 133 (*)    Hemoglobin 7.8 (*)    HCT 23.0 (*)    All other components within normal limits  GLUCOSE, CAPILLARY - Abnormal; Notable for the following:    Glucose-Capillary 112 (*)    All other components within normal limits  URINALYSIS, ROUTINE W REFLEX MICROSCOPIC  TROPONIN I   Ct Angio Chest W/cm &/or Wo Cm  08/25/2011  *RADIOLOGY REPORT*  Clinical Data: Shortness of breath and fatigue.  Recent knee surgery.  CT ANGIOGRAPHY CHEST  Technique:  Multidetector CT imaging of the chest using the standard protocol during bolus administration of intravenous contrast. Multiplanar reconstructed images including MIPs were obtained and reviewed to evaluate the vascular anatomy.  Contrast: 50mL OMNIPAQUE IOHEXOL 350 MG/ML SOLN  Comparison: 08/13/2011 chest radiograph  Findings: This study is technically adequate. No pulmonary emboli are identified. There is no evidence of thoracic aortic aneurysm.  Cardiomegaly and moderate to heavy calcification in the LAD noted. There are no pleural or pericardial effusions present. No enlarged lymph nodes are identified.  Very mild central ground-glass opacities are noted.  Mild interstitial edema is not excluded.  There is no evidence of airspace disease, consolidation, suspicious nodule/mass or endobronchial/endotracheal lesions.  No acute  or suspicious bony abnormalities are noted. The visualized upper abdomen is unremarkable.  IMPRESSION: No evidence of pulmonary emboli or thoracic aortic aneurysm.  Cardiomegaly and coronary artery disease.  Very mild central ground-glass opacities which could represent mild interstitial edema.  Correlate clinically.  Original Report Authenticated By: Rosendo Gros, M.D.     No diagnosis found.  1. Symptomatic anemia   MDM  Hemoglobin found to be 7.8 with last level of 8.5 3 days ago. She reports having had to be transfused after the surgery 4 days ago. No melena, she denies abdominal pain. OPC to admit (unassigned).  Rodena Medin, PA-C 08/25/11 1503

## 2011-08-25 NOTE — ED Notes (Signed)
Pt. Had a knee replacement this past Monday and developed sob on WEdnesday.   Pt. Was sent to [place] by [facility] for a CT scan to r/o blood clot.  Pt. Is sob when speaking. Oxygen sats 100%

## 2011-08-25 NOTE — ED Notes (Signed)
Attempted to call report to [phone], RN will call me back

## 2011-08-25 NOTE — H&P (Signed)
Hospital Admission Note Date: 08/25/2011  Patient name: Sabrina Hunt Medical record number: [id number] Date of birth: Jan 05, 1933 Age: 78 y.o. Gender: female PCP: Dr. Rosine Beat  Medical Service: Internal Medicine Teaching Service  Attending physician: Internal Medicine Teaching Service    1st Contact: Janalyn Harder   Pager: [phone] 2nd Contact: Bard Herbert   Pager: [phone] After 5 pm or weekends: 1st Contact:      Pager: [phone] 2nd Contact:      Pager: [phone]  Chief Complaint: Shortness of breath  History of Present Illness: The patient is a 78 yo woman, history of chronic anemia, s/p right knee replacement 5 days PTA, presenting with shortness of breath.  The patient underwent right knee replacement 5 days PTA, with pre-op Hb = 11.4, and post-op Hb = 8.2 after transfusion of 2 units of blood.  Since hospital discharge 3 days ago, the patient has been feeling progressively more shortness of breath, exacerbated by exertion, improved with rest.  She also notes a 1-day history of sore throat and nasal congestion, but no fevers, no cough, and no sick contacts.  She also notes mild nausea and decreased appetite, as well as mild constipation since her surgery, last BM this morning, with no BRBPR or melena.  She has a history of normocytic anemia of unknown etiology, with a normal colonoscopy 8 years ago.  Meds: Medications Prior to Admission  Medication Dose Route Frequency Provider Last Rate Last Dose  . 0.9 %  sodium chloride infusion   Intravenous Continuous Juliet Rude. Rubin Payor, MD 125 mL/hr at 08/25/11 1248    . iohexol (OMNIPAQUE) 350 MG/ML injection 50 mL  50 mL Intravenous Once PRN Medication Radiologist, MD   50 mL at 08/25/11 1319   Medications Prior to Admission  Medication Sig Dispense Refill  . Calcium-Vitamin D (CALTRATE 600 PLUS-VIT D PO) Take 1 tablet by mouth 2 (two) times daily.      . celecoxib (CELEBREX) 200 MG capsule Take 200 mg by mouth 2 (two) times daily.       . Cholecalciferol (VITAMIN D-3 PO) Take 1 capsule by mouth daily.      Marland Kitchen enoxaparin (LOVENOX) 40 MG/0.4ML injection Inject 40 mg into the skin daily.      Marland Kitchen gabapentin (NEURONTIN) 300 MG capsule Take 300 mg by mouth at bedtime.      Marland Kitchen losartan (COZAAR) 100 MG tablet Take 100 mg by mouth daily.      . methocarbamol (ROBAXIN) 500 MG tablet Take 500-1,000 mg by mouth every 6 (six) hours as needed.      . Multiple Vitamin (MULITIVITAMIN WITH MINERALS) TABS Take 1 tablet by mouth daily.      Marland Kitchen oxyCODONE (OXY IR/ROXICODONE) 5 MG immediate release tablet Take 5-10 mg by mouth every 4 (four) hours as needed.      . rosuvastatin (CRESTOR) 20 MG tablet Take 20 mg by mouth daily.      . sitaGLIPtin (JANUVIA) 50 MG tablet Take 50 mg by mouth daily.      Marland Kitchen DISCONTD: enoxaparin (LOVENOX) 40 MG/0.4ML injection Inject 0.4 mLs (40 mg total) into the skin daily.  12 Syringe  0    Allergies: Allergies as of 08/25/2011 - Review Complete 08/25/2011  Allergen Reaction Noted  . Amoxicillin Nausea And Vomiting 08/07/2011  . Hydrocodone Nausea Only 08/13/2011  . Latex  08/13/2011  . Prednisone Nausea And Vomiting 08/07/2011   Past Medical History  Diagnosis Date  . Hypertension   . Constipation   .  Diabetes mellitus   . Left ankle injury     Swelling  . Anemia   . Hyperlipidemia   . Arthritis   . Neuromuscular disorder   . Blood transfusion     2010   Past Surgical History  Procedure Date  . Tonsillectomy   . Appendectomy   . Abdominal hysterectomy   . Rotator cuff repair     Right  . Joint replacement 11/2008    Left Knee Replacement  . Carpal tunnel release     Left  . Colonscopy 2004  . Total knee arthroplasty 08/20/2011    Procedure: TOTAL KNEE ARTHROPLASTY;  Surgeon: Raymon Mutton, MD;  Location: [facility] OR;  Service: Orthopedics;  Laterality: Right;   Family History  Problem Relation Age of Onset  . Anesthesia problems Neg Hx    History   Social History  . Marital Status: Married     Spouse Name: N/A    Number of Children: N/A  . Years of Education: N/A   Occupational History  . Not on file.   Social History Main Topics  . Smoking status: Never Smoker   . Smokeless tobacco: Not on file  . Alcohol Use: No  . Drug Use: No  . Sexually Active:    Other Topics Concern  . Not on file   Social History Narrative   Married for 58 yrs, 2 kids, 2 grandkids.  Previously worked in a [employer], then in [employer], retired 15 yrs ago.  Graduated 12th grade, has Medicare and [employer].  Previously enjoyed Retail buyer games" (shuffleboard, etc.) until her knee problems.    Review of Systems: General: no fevers, chills, changes in weight Skin: no rash HEENT: no blurry vision, hearing changes Pulm: see HPI CV: no chest pain, palpitations Abd: no abdominal pain, vomiting, diarrhea GU: no dysuria, hematuria, polyuria Ext: no arthralgias, myalgias Neuro: no weakness, numbness, or tingling  Physical Exam: Blood pressure 143/46, pulse 85, temperature 99 F (37.2 C), temperature source Oral, resp. rate 18, SpO2 100.00%. General: alert, cooperative, and in no apparent distress HEENT: pupils equal round and reactive to light, vision grossly intact, oropharynx clear and non-erythematous  Neck: supple, no lymphadenopathy Lungs: clear to ascultation bilaterally, normal work of respiration, no wheezes, rales, ronchi Heart: regular rate and rhythm, no murmurs, gallops, or rubs Abdomen: soft, non-tender, non-distended, normal bowel sounds Extremities: legs with 2+ bilateral pitting edema, R > L (though patient notes no change since hospital discharge), right knee wound clean, dry, and intact Neurologic: alert & oriented X3, cranial nerves II-XII intact, strength grossly intact, sensation intact to light touch   Lab results: Basic Metabolic Panel:  Basename 08/25/11 1211  NA 139  K 3.3*  CL 104  CO2 --  GLUCOSE 133*  BUN 16  CREATININE 1.10  CALCIUM --  MG --    PHOS --   CBC:  Basename 08/25/11 1211 08/25/11 1157  WBC -- 7.8  NEUTROABS -- 6.1  HGB 7.8* 7.8*  HCT 23.0* 22.9*  MCV -- 85.1  PLT -- 195   Cardiac Enzymes:  Basename 08/25/11 1157  CKTOTAL --  CKMB --  CKMBINDEX --  TROPONINI <0.30   CBG:  Basename 08/25/11 1247  GLUCAP 112*   Urinalysis:  Basename 08/25/11 1239  COLORURINE YELLOW  LABSPEC 1.010  PHURINE 6.5  GLUCOSEU NEGATIVE  HGBUR NEGATIVE  BILIRUBINUR NEGATIVE  KETONESUR NEGATIVE  PROTEINUR NEGATIVE  UROBILINOGEN 1.0  NITRITE NEGATIVE  LEUKOCYTESUR NEGATIVE    Imaging results:  Ct Angio Chest W/cm &/or Wo Cm  08/25/2011  *RADIOLOGY REPORT*  Clinical Data: Shortness of breath and fatigue.  Recent knee surgery.  CT ANGIOGRAPHY CHEST  Technique:  Multidetector CT imaging of the chest using the standard protocol during bolus administration of intravenous contrast. Multiplanar reconstructed images including MIPs were obtained and reviewed to evaluate the vascular anatomy.  Contrast: 50mL OMNIPAQUE IOHEXOL 350 MG/ML SOLN  Comparison: 08/13/2011 chest radiograph  Findings: This study is technically adequate. No pulmonary emboli are identified. There is no evidence of thoracic aortic aneurysm.  Cardiomegaly and moderate to heavy calcification in the LAD noted. There are no pleural or pericardial effusions present. No enlarged lymph nodes are identified.  Very mild central ground-glass opacities are noted.  Mild interstitial edema is not excluded.  There is no evidence of airspace disease, consolidation, suspicious nodule/mass or endobronchial/endotracheal lesions.  No acute or suspicious bony abnormalities are noted. The visualized upper abdomen is unremarkable.  IMPRESSION: No evidence of pulmonary emboli or thoracic aortic aneurysm.  Cardiomegaly and coronary artery disease.  Very mild central ground-glass opacities which could represent mild interstitial edema.  Correlate clinically.  Original Report Authenticated By:  Rosendo Gros, M.D.    Other results: EKG: NSR, widened QRS complex, likely RBBB, but seen on prior EKG.  No ST segment changes.  Assessment & Plan by Problem: The patient is a 78 yo woman, history of HTN, DM, HL, presenting with shortness of breath.  # Shortness of Breath - patient presents with gradual SOB since hospital discharge for an admission , with Hb = 7.8, previously 8.2-8.5 post-op.  Symptoms are most concerning for symptomatic anemia, but may also represent URI (given 1-day nasal congestion and ST) vs volume depletion (given poor PO intake).  Patient on lovenox, but notes no visible BRBPR, FOBT pending.  Unlikely PNA given no opacity on CXR.  CTA negative for PE. -IV fluids -regular diet -FOBT pending -follow CBC's -consider blood transfusion  # Anemia - see above -anemia panel -FOBT pending -follow CBC's -consider transfusion -protonix  # Right knee pain - s/p R knee replacement.  Wound appears c/d/i.  Patient on oxycodone, celecoxib, and tylenol for pain. -hold celecoxib out of concern for GI bleed while on lovenox -continue tylenol, oxycodone  # DM - A1C = 5.7, on home [place] -hold januvia -given good glucose control on just januvia, will observe CBG's for now, and add SSI if needed  # HTN - history of HTN, on home  Losartan -hold losartan, until r/o GI bleed  # Hyperlipidemia - on home rosuvastatin -continue statin  # Prophy - lovenox  SignedJanalyn Harder 08/25/2011, 4:17 PM

## 2011-08-25 NOTE — Progress Notes (Signed)
BRAILEE RIEDE [id number] Unit Arrival: 08/25/2011 1720 Attending Provider: Ulyess Mort, MD   Sabrina Hunt is a 78 y.o. female patient admitted from ED awake, alert  & orientated  X 3, VSS - Blood pressure 135/63, pulse 90, temperature 98.7 F (37.1 C), temperature source Oral, resp. rate 18, height 5\' 6"  (1.676 m), weight 85.7 kg (188 lb 15 oz), SpO2 100.00%., no c/o shortness of breath, no c/o chest pain, no distress noted. IV site WDL:  antecubital right, condition patent and no redness with a transparent dsg that's clean dry and intact.  Allergies:   Allergies  Allergen Reactions  . Amoxicillin Nausea And Vomiting  . Hydrocodone Nausea Only  . Latex   . Prednisone Nausea And Vomiting     Past Medical History  Diagnosis Date  . Hypertension   . Constipation   . Diabetes mellitus   . Left ankle injury     Swelling  . Anemia   . Hyperlipidemia   . Arthritis   . Neuromuscular disorder   . Blood transfusion     2010     Pt orientation to unit, room and routine. Information packet given to patient/family and safety video watched.  Admission INP armband ID verified with patient/family, and in place. SR up x 2, fall risk assessment complete with Patient and family verbalizing understanding of risks associated with falls. Pt verbalizes an understanding of how to use the call bell and to call for help before getting out of bed.  Skin, clean-dry- intact with an incision to the rt knee, edges approximated with staples in place. Non adherent pad and paper tape dsg remains. Pt also has mild redness to posterior thigh possibly from thigh high ted hose. Ted hose adjusted to relieve pressure. No open wounds noted.  Will cont to monitor and assist as needed.  Julien Nordmann [facility], RN 08/25/2011 6:27 PM

## 2011-08-25 NOTE — ED Provider Notes (Signed)
Medical screening examination/treatment/procedure(s) were conducted as a shared visit with non-physician practitioner(s) and myself.  I personally evaluated the patient during the encounter  Cheri Guppy, MD 08/25/11 1539

## 2011-08-25 NOTE — ED Notes (Signed)
Admitting md at bedside to eval pt 

## 2011-08-26 DIAGNOSIS — D649 Anemia, unspecified: Secondary | ICD-10-CM | POA: Diagnosis not present

## 2011-08-26 LAB — GLUCOSE, CAPILLARY

## 2011-08-26 LAB — COMPREHENSIVE METABOLIC PANEL
ALT: 32 U/L (ref 0–35)
AST: 39 U/L — ABNORMAL HIGH (ref 0–37)
Albumin: 2.6 g/dL — ABNORMAL LOW (ref 3.5–5.2)
Alkaline Phosphatase: 60 U/L (ref 39–117)
Calcium: 8.5 mg/dL (ref 8.4–10.5)
Glucose, Bld: 126 mg/dL — ABNORMAL HIGH (ref 70–99)
Potassium: 3.3 mEq/L — ABNORMAL LOW (ref 3.5–5.1)
Sodium: 139 mEq/L (ref 135–145)
Total Protein: 5.8 g/dL — ABNORMAL LOW (ref 6.0–8.3)

## 2011-08-26 LAB — PREPARE RBC (CROSSMATCH)

## 2011-08-26 LAB — CBC
Hemoglobin: 7.1 g/dL — ABNORMAL LOW (ref 12.0–15.0)
MCHC: 34.3 g/dL (ref 30.0–36.0)
Platelets: 181 10*3/uL (ref 150–400)
RDW: 13.7 % (ref 11.5–15.5)

## 2011-08-26 LAB — IRON AND TIBC
Iron: 27 ug/dL — ABNORMAL LOW (ref 42–135)
TIBC: 239 ug/dL — ABNORMAL LOW (ref 250–470)
UIBC: 212 ug/dL (ref 125–400)

## 2011-08-26 LAB — FERRITIN: Ferritin: 166 ng/mL (ref 10–291)

## 2011-08-26 LAB — FOLATE: Folate: >20 ng/mL

## 2011-08-26 MED ORDER — ONDANSETRON HCL 4 MG/2ML IJ SOLN
4.0000 mg | Freq: Four times a day (QID) | INTRAMUSCULAR | Status: DC | PRN
  Administered 2011-08-26: 4 mg via INTRAVENOUS

## 2011-08-26 MED ORDER — ONDANSETRON HCL 4 MG/2ML IJ SOLN
INTRAMUSCULAR | Status: AC
  Filled 2011-08-26: qty 2

## 2011-08-26 MED ORDER — INSULIN ASPART 100 UNIT/ML ~~LOC~~ SOLN
0.0000 [IU] | Freq: Three times a day (TID) | SUBCUTANEOUS | Status: DC
  Administered 2011-08-26 – 2011-08-27 (×3): 1 [IU] via SUBCUTANEOUS

## 2011-08-26 NOTE — H&P (Signed)
79 woman 5 days post knee replacement.  Adm for SOB w/o chest pain or wheezing.  CTA negative for PE.  )2 100% on room air.  All labs nl except hgb of 7.8.  She has a long hx of "anemia" followed by her PCP in [place].  Has transfusions post op.  No bleeding since surgery.  Small dressing is certainly not blood-soaked.  Guiaic negative.  No CV sx at all.  EKG: RBBB and LAD -- unchanged from pre-op tracing. Hgb has drifted to 7.1 after some IVF.  Will give one unit tx and send home for f/u with her doctor in [place].

## 2011-08-26 NOTE — Progress Notes (Signed)
Subjective: No acute events overnight.  Patient notes continued SOB this morning.  After giving IV fluids and decreasing hemoconcentration, Hb is now 7.1.  Patient wants transfusion.  Objective: Vital signs in last 24 hours: Filed Vitals:   08/25/11 1630 08/25/11 1734 08/25/11 2200 08/26/11 0544  BP: 136/50 135/63 118/61 126/58  Pulse: 81 90 93 78  Temp: 98.4 F (36.9 C) 98.7 F (37.1 C) 98.3 F (36.8 C) 98.2 F (36.8 C)  TempSrc: Oral Oral Oral Oral  Resp: 18 18 16 16   Height:  5\' 6"  (1.676 m)    Weight:  188 lb 15 oz (85.7 kg)    SpO2: 100% 100% 98% 99%   Weight change:   Intake/Output Summary (Last 24 hours) at 08/26/11 1423 Last data filed at 08/26/11 0600  Gross per 24 hour  Intake  877.5 ml  Output      0 ml  Net  877.5 ml   Physical Exam: Blood pressure 143/46, pulse 85, temperature 99 F (37.2 C), temperature source Oral, resp. rate 18, SpO2 100.00%.  General: alert, cooperative, and in no apparent distress HEENT: pupils equal round and reactive to light, vision grossly intact, oropharynx clear and non-erythematous  Neck: supple, no lymphadenopathy Lungs: clear to ascultation bilaterally, normal work of respiration, no wheezes, rales, ronchi Heart: regular rate and rhythm, no murmurs, gallops, or rubs Abdomen: soft, non-tender, non-distended, normal bowel sounds  Extremities: legs with 2+ bilateral pitting edema, R > L (though patient notes no change since hospital discharge), right knee wound clean, dry, and intact Neurologic: alert & oriented X3, cranial nerves II-XII intact, strength grossly intact, sensation intact to light touch  Lab Results: Basic Metabolic Panel:  Lab 08/26/11 1308 08/25/11 1211 08/22/11 0620  NA 139 139 --  K 3.3* 3.3* --  CL 105 104 --  CO2 24 -- 27  GLUCOSE 126* 133* --  BUN 13 16 --  CREATININE 0.90 1.10 --  CALCIUM 8.5 -- 8.4  MG -- -- --  PHOS -- -- --   Liver Function Tests:  Lab 08/26/11 0615  AST 39*  ALT 32    ALKPHOS 60  BILITOT 0.6  PROT 5.8*  ALBUMIN 2.6*   CBC:  Lab 08/26/11 0615 08/25/11 1211 08/25/11 1157  WBC 7.0 -- 7.8  NEUTROABS -- -- 6.1  HGB 7.1* 7.8* --  HCT 20.7* 23.0* --  MCV 85.2 -- 85.1  PLT 181 -- 195   Cardiac Enzymes:  Lab 08/25/11 1157  CKTOTAL --  CKMB --  CKMBINDEX --  TROPONINI <0.30   CBG:  Lab 08/26/11 1233 08/26/11 0759 08/25/11 2136 08/25/11 1731 08/25/11 1247 08/22/11 1131  GLUCAP 122* 130* 174* 162* 112* 144*   Anemia Panel:  Lab 08/25/11 2017  VITAMINB12 373  FOLATE >20.0  FERRITIN 166  TIBC 239*  IRON 27*  RETICCTPCT 2.6   Urinalysis:  Lab 08/25/11 1239  COLORURINE YELLOW  LABSPEC 1.010  PHURINE 6.5  GLUCOSEU NEGATIVE  HGBUR NEGATIVE  BILIRUBINUR NEGATIVE  KETONESUR NEGATIVE  PROTEINUR NEGATIVE  UROBILINOGEN 1.0  NITRITE NEGATIVE  LEUKOCYTESUR NEGATIVE    Micro Results: No results found for this or any previous visit (from the past 240 hour(s)). Studies/Results: Ct Angio Chest W/cm &/or Wo Cm  08/25/2011  *RADIOLOGY REPORT*  Clinical Data: Shortness of breath and fatigue.  Recent knee surgery.  CT ANGIOGRAPHY CHEST  Technique:  Multidetector CT imaging of the chest using the standard protocol during bolus administration of intravenous contrast. Multiplanar reconstructed images including  MIPs were obtained and reviewed to evaluate the vascular anatomy.  Contrast: 50mL OMNIPAQUE IOHEXOL 350 MG/ML SOLN  Comparison: 08/13/2011 chest radiograph  Findings: This study is technically adequate. No pulmonary emboli are identified. There is no evidence of thoracic aortic aneurysm.  Cardiomegaly and moderate to heavy calcification in the LAD noted. There are no pleural or pericardial effusions present. No enlarged lymph nodes are identified.  Very mild central ground-glass opacities are noted.  Mild interstitial edema is not excluded.  There is no evidence of airspace disease, consolidation, suspicious nodule/mass or endobronchial/endotracheal  lesions.  No acute or suspicious bony abnormalities are noted. The visualized upper abdomen is unremarkable.  IMPRESSION: No evidence of pulmonary emboli or thoracic aortic aneurysm.  Cardiomegaly and coronary artery disease.  Very mild central ground-glass opacities which could represent mild interstitial edema.  Correlate clinically.  Original Report Authenticated By: Rosendo Gros, M.D.   Medications: I have reviewed the patient's current medications. Scheduled Meds:   . atorvastatin  40 mg Oral q1800  . enoxaparin  40 mg Subcutaneous Q24H  . gabapentin  300 mg Oral QHS  . insulin aspart  0-9 Units Subcutaneous TID WC  . mulitivitamin with minerals  1 tablet Oral Daily  . ondansetron      . pantoprazole  40 mg Oral Q1200  . DISCONTD: celecoxib  200 mg Oral BID   Continuous Infusions:   . sodium chloride 75 mL (08/26/11 0736)  . DISCONTD: sodium chloride 125 mL/hr at 08/25/11 1248   PRN Meds:.acetaminophen, methocarbamol, ondansetron, oxyCODONE  Assessment/Plan: The patient is a 78 yo woman, history of HTN, DM, HL, presenting with shortness of breath.   # Shortness of Breath - patient presents with gradual SOB since hospital discharge for an admission , with Hb = 7.8, previously 8.2-8.5 post-op. Symptoms are most concerning for symptomatic anemia, but may also represent URI (given 1-day nasal congestion and ST). Patient on lovenox, but notes no visible BRBPR, FOBT neg. Unlikely PNA given no opacity on CXR. CTA negative for PE.  -regular diet  -transfusing 2 units RBC's today -CBC in am  # Anemia - see above. Anemia panel shows normal ferritin, though may not be accurate in the setting of recent transfusion <1 week ago -follow CBC's  -protonix   # Right knee pain - s/p R knee replacement. Wound appears c/d/i. Patient on oxycodone, celecoxib, and tylenol for pain.  -hold celecoxib out of concern for GI bleed while on lovenox  -continue tylenol, oxycodone   # DM - A1C = 5.7, on  home [place]  -hold januvia  -SSI  # HTN - history of HTN, on home Losartan  -hold losartan  # Hyperlipidemia - on home rosuvastatin  -continue statin   # Prophy - lovenox    LOS: 1 day   Sabrina Hunt 08/26/2011, 2:23 PM

## 2011-08-27 DIAGNOSIS — D649 Anemia, unspecified: Secondary | ICD-10-CM | POA: Diagnosis not present

## 2011-08-27 LAB — TYPE AND SCREEN
ABO/RH(D): A NEG
Antibody Screen: NEGATIVE
Unit division: 0

## 2011-08-27 LAB — CBC
HCT: 25.6 % — ABNORMAL LOW (ref 36.0–46.0)
Hemoglobin: 8.8 g/dL — ABNORMAL LOW (ref 12.0–15.0)
MCV: 84.5 fL (ref 78.0–100.0)
RDW: 14.1 % (ref 11.5–15.5)
WBC: 9.3 10*3/uL (ref 4.0–10.5)

## 2011-08-27 LAB — GLUCOSE, CAPILLARY
Glucose-Capillary: 121 mg/dL — ABNORMAL HIGH (ref 70–99)
Glucose-Capillary: 117 mg/dL — ABNORMAL HIGH (ref 70–99)

## 2011-08-27 LAB — BASIC METABOLIC PANEL
BUN: 12 mg/dL (ref 6–23)
Chloride: 102 mEq/L (ref 96–112)
Creatinine, Ser: 0.95 mg/dL (ref 0.50–1.10)
Glucose, Bld: 125 mg/dL — ABNORMAL HIGH (ref 70–99)
Potassium: 3.2 mEq/L — ABNORMAL LOW (ref 3.5–5.1)

## 2011-08-27 MED ORDER — POTASSIUM CHLORIDE CRYS ER 20 MEQ PO TBCR
40.0000 meq | EXTENDED_RELEASE_TABLET | Freq: Once | ORAL | Status: AC
  Administered 2011-08-27: 40 meq via ORAL
  Filled 2011-08-27: qty 2

## 2011-08-27 NOTE — Discharge Instructions (Signed)

## 2011-08-27 NOTE — Progress Notes (Signed)
Internal Medicine Teaching Service Attending Note Date: 08/27/2011  Patient name: Sabrina Hunt  Medical record number: [id number]  Date of birth: 06/17/1932    This patient has been seen and discussed with the house staff. Please see their note for complete details. I concur with their findings with the following additions/corrections:  [name] is feeling better after 2 units PRBC last PM. She is stable for D/C. She will start outpt PT and has F/U appts with her PCP and orthopedist. She will cont Lovenox injections.   Remington Skalsky 08/27/2011, 11:51 AM

## 2011-08-27 NOTE — Progress Notes (Signed)
Subjective: No acute events overnight.  Patient notes significantly less SOB after blood transfusion yesterday, and Hb has increased from 7.1 to 8.8.  Objective: Vital signs in last 24 hours: Filed Vitals:   08/26/11 2135 08/26/11 2230 08/26/11 2238 08/27/11 0430  BP: 154/62 146/57 147/64 141/56  Pulse: 83 76 76 68  Temp: 98.6 F (37 C) 98.4 F (36.9 C) 98.2 F (36.8 C) 98 F (36.7 C)  TempSrc: Oral Oral Oral Oral  Resp: 18 18 18 18   Height:      Weight:      SpO2: 95%   98%   Weight change:   Intake/Output Summary (Last 24 hours) at 08/27/11 0833 Last data filed at 08/26/11 1750  Gross per 24 hour  Intake 325.83 ml  Output      0 ml  Net 325.83 ml   Physical Exam: Blood pressure 143/46, pulse 85, temperature 99 F (37.2 C), temperature source Oral, resp. rate 18, SpO2 100.00%.  General: alert, cooperative, and in no apparent distress HEENT: pupils equal round and reactive to light, vision grossly intact, oropharynx clear and non-erythematous  Neck: supple, no lymphadenopathy Lungs: clear to ascultation bilaterally, normal work of respiration, no wheezes, rales, ronchi Heart: regular rate and rhythm, no murmurs, gallops, or rubs Abdomen: soft, non-tender, non-distended, normal bowel sounds  Extremities: legs with 2+ bilateral pitting edema, R > L (though patient notes no change since hospital discharge), right knee wound clean, dry, and intact Neurologic: alert & oriented X3, cranial nerves II-XII intact, strength grossly intact, sensation intact to light touch  Lab Results: Basic Metabolic Panel:  Lab 08/27/11 1610 08/26/11 0615  NA 135 139  K 3.2* 3.3*  CL 102 105  CO2 26 24  GLUCOSE 125* 126*  BUN 12 13  CREATININE 0.95 0.90  CALCIUM 8.6 8.5  MG -- --  PHOS -- --   Liver Function Tests:  Lab 08/26/11 0615  AST 39*  ALT 32  ALKPHOS 60  BILITOT 0.6  PROT 5.8*  ALBUMIN 2.6*   CBC:  Lab 08/27/11 0238 08/26/11 0615 08/25/11 1157  WBC 9.3 7.0 --    NEUTROABS -- -- 6.1  HGB 8.8* 7.1* --  HCT 25.6* 20.7* --  MCV 84.5 85.2 --  PLT 197 181 --   Cardiac Enzymes:  Lab 08/25/11 1157  CKTOTAL --  CKMB --  CKMBINDEX --  TROPONINI <0.30   CBG:  Lab 08/26/11 2110 08/26/11 1656 08/26/11 1233 08/26/11 0759 08/25/11 2136 08/25/11 1731  GLUCAP 175* 136* 122* 130* 174* 162*   Anemia Panel:  Lab 08/25/11 2017  VITAMINB12 373  FOLATE >20.0  FERRITIN 166  TIBC 239*  IRON 27*  RETICCTPCT 2.6   Urinalysis:  Lab 08/25/11 1239  COLORURINE YELLOW  LABSPEC 1.010  PHURINE 6.5  GLUCOSEU NEGATIVE  HGBUR NEGATIVE  BILIRUBINUR NEGATIVE  KETONESUR NEGATIVE  PROTEINUR NEGATIVE  UROBILINOGEN 1.0  NITRITE NEGATIVE  LEUKOCYTESUR NEGATIVE    Micro Results: No results found for this or any previous visit (from the past 240 hour(s)). Studies/Results: Ct Angio Chest W/cm &/or Wo Cm  08/25/2011  *RADIOLOGY REPORT*  Clinical Data: Shortness of breath and fatigue.  Recent knee surgery.  CT ANGIOGRAPHY CHEST  Technique:  Multidetector CT imaging of the chest using the standard protocol during bolus administration of intravenous contrast. Multiplanar reconstructed images including MIPs were obtained and reviewed to evaluate the vascular anatomy.  Contrast: 50mL OMNIPAQUE IOHEXOL 350 MG/ML SOLN  Comparison: 08/13/2011 chest radiograph  Findings: This study  is technically adequate. No pulmonary emboli are identified. There is no evidence of thoracic aortic aneurysm.  Cardiomegaly and moderate to heavy calcification in the LAD noted. There are no pleural or pericardial effusions present. No enlarged lymph nodes are identified.  Very mild central ground-glass opacities are noted.  Mild interstitial edema is not excluded.  There is no evidence of airspace disease, consolidation, suspicious nodule/mass or endobronchial/endotracheal lesions.  No acute or suspicious bony abnormalities are noted. The visualized upper abdomen is unremarkable.  IMPRESSION: No  evidence of pulmonary emboli or thoracic aortic aneurysm.  Cardiomegaly and coronary artery disease.  Very mild central ground-glass opacities which could represent mild interstitial edema.  Correlate clinically.  Original Report Authenticated By: Rosendo Gros, M.D.   Medications: I have reviewed the patient's current medications. Scheduled Meds:    . atorvastatin  40 mg Oral q1800  . enoxaparin  40 mg Subcutaneous Q24H  . gabapentin  300 mg Oral QHS  . insulin aspart  0-9 Units Subcutaneous TID WC  . mulitivitamin with minerals  1 tablet Oral Daily  . pantoprazole  40 mg Oral Q1200  . potassium chloride  40 mEq Oral Once   Continuous Infusions:    . DISCONTD: sodium chloride 75 mL (08/26/11 0736)   PRN Meds:.acetaminophen, methocarbamol, ondansetron, oxyCODONE  Assessment/Plan: The patient is a 78 yo woman, history of HTN, DM, HL, presenting with shortness of breath.   # Symptomatic Anemia - patient presents with gradual SOB since hospital discharge for an admission , with Hb = 7.8, previously 8.2-8.5 post-op. Symptoms are most concerning for symptomatic anemia, but may also represent URI (given 1-day nasal congestion and ST). Patient on lovenox, but notes no visible BRBPR, FOBT neg. Unlikely PNA given no opacity on CXR. CTA negative for PE.  -regular diet  -s/p transfusion of 2 units of pRBC's  # Anemia - see above. Anemia panel shows normal ferritin, though may not be accurate in the setting of recent transfusion <1 week ago -follow CBC's  -protonix   # Right knee pain - s/p R knee replacement. Wound appears c/d/i. Patient on oxycodone, celecoxib, and tylenol for pain.  -hold celecoxib out of concern for GI bleed while on lovenox  -continue tylenol, oxycodone   # DM - A1C = 5.7, on home [place]  -hold januvia  -SSI  # HTN - history of HTN, on home Losartan  -hold losartan  # Hyperlipidemia - on home rosuvastatin  -continue statin   # Prophy - lovenox  # Dispo - d/c  home today   LOS: 2 days   Sabrina Hunt 08/27/2011, 8:33 AM

## 2011-08-27 NOTE — Plan of Care (Signed)
Problem: Phase I Progression Outcomes Goal: Initial discharge plan identified Outcome: Completed/Met Date Met:  08/27/11 To return home  Problem: Phase II Progression Outcomes Goal: Discharge plan established Outcome: Completed/Met Date Met:  08/27/11 To return home with h/h  Problem: Phase III Progression Outcomes Goal: Activity at appropriate level-compared to baseline (UP IN CHAIR FOR HEMODIALYSIS)  Outcome: Progressing To have H/H PT/OT at home  Problem: Discharge Progression Outcomes Goal: Activity appropriate for discharge plan Outcome: Progressing To have H/H

## 2011-08-27 NOTE — Progress Notes (Signed)
08/27/11 NSG 1250 Patient discharged to home with family, discharged instructions given and reviewed with patient.  Patient verbalized understanding. Skin intact at discharge except surgical site to right knee.  IV discharged and intact. Patient escorted to car via wheelchair by volunteer service.

## 2011-08-27 NOTE — Progress Notes (Signed)
Utilization review complete 

## 2011-08-27 NOTE — Discharge Summary (Signed)
Internal Medicine Teaching [facility] Discharge Note  Name: Sabrina Hunt MRN: [id number] DOB: 1932-11-16 78 y.o.  Date of Admission: 08/25/2011 11:21 AM Date of Discharge: 08/27/2011 Attending Physician: Burns Spain, MD  Discharge Diagnosis: 1. Symptomatic Anemia - secondary to blood loss s/p R knee replacement 2. Chronic Anemia - unclear etiology, reports prior iron supplementaion 3. Diabetes Mellitus - A1C = 5.7, on home januvia 4. Hypertension 5. Hyperlipidemia  Discharge Medications: Medication List  As of 08/27/2011  8:36 AM   TAKE these medications         acetaminophen 500 MG tablet   Commonly known as: TYLENOL   Take 500 mg by mouth every 6 (six) hours as needed. FOR PAIN      CALTRATE 600 PLUS-VIT D PO   Take 1 tablet by mouth 2 (two) times daily.      celecoxib 200 MG capsule   Commonly known as: CELEBREX   Take 200 mg by mouth 2 (two) times daily.      enoxaparin 40 MG/0.4ML injection   Commonly known as: LOVENOX   Inject 40 mg into the skin daily.      gabapentin 300 MG capsule   Commonly known as: NEURONTIN   Take 300 mg by mouth at bedtime.      losartan 100 MG tablet   Commonly known as: COZAAR   Take 100 mg by mouth daily.      methocarbamol 500 MG tablet   Commonly known as: ROBAXIN   Take 500 mg by mouth every 6 (six) hours as needed. For muscle spasms      mulitivitamin with minerals Tabs   Take 1 tablet by mouth daily.      oxyCODONE 5 MG immediate release tablet   Commonly known as: Oxy IR/ROXICODONE   Take 5-10 mg by mouth every 4 (four) hours as needed.      rosuvastatin 20 MG tablet   Commonly known as: CRESTOR   Take 20 mg by mouth daily.      sitaGLIPtin 100 MG tablet   Commonly known as: JANUVIA   Take 50 mg by mouth daily.      VITAMIN D-3 PO   Take 1 capsule by mouth daily.            Disposition and follow-up:   Sabrina Hunt was discharged from [facility] in stable and improved  condition, with improvement in SOB.  The patient will follow-up with Dr. Sherlean Foot, her orthopedic surgeon, for further post-op management of her R knee replacement.  The patient will follow-up on 4/22 with her PCP, Dr. Sheria Lang, for further management of her anemia and other chronic health conditions.  Follow-up Appointments:  Follow-up Information    Follow up with Raymon Mutton, MD. Call on 09/04/2011.   Contact information:   [address] [place] [address] [phone]       Follow up with Desmond Dike, MD on 09/03/2011. (11:00 am)    Contact information:   [address] [place] [address] [phone]           Consultations: None  Procedures Performed:  Dg Chest 2 View  08/13/2011  *RADIOLOGY REPORT*  Clinical Data: Preop.  CHEST - 2 VIEW  Comparison: 11/18/2008  Findings: There is hyperinflation of the lungs compatible with COPD.  Heart and mediastinal contours are within normal limits.  No focal opacities or effusions.  No acute bony abnormality.  IMPRESSION: COPD.  No acute  findings.  Original Report Authenticated By: Cyndie Chime, M.D.   Ct Angio Chest W/cm &/or Wo Cm  08/25/2011  *RADIOLOGY REPORT*  Clinical Data: Shortness of breath and fatigue.  Recent knee surgery.  CT ANGIOGRAPHY CHEST  Technique:  Multidetector CT imaging of the chest using the standard protocol during bolus administration of intravenous contrast. Multiplanar reconstructed images including MIPs were obtained and reviewed to evaluate the vascular anatomy.  Contrast: 50mL OMNIPAQUE IOHEXOL 350 MG/ML SOLN  Comparison: 08/13/2011 chest radiograph  Findings: This study is technically adequate. No pulmonary emboli are identified. There is no evidence of thoracic aortic aneurysm.  Cardiomegaly and moderate to heavy calcification in the LAD noted. There are no pleural or pericardial effusions present. No enlarged lymph nodes are identified.  Very mild central ground-glass  opacities are noted.  Mild interstitial edema is not excluded.  There is no evidence of airspace disease, consolidation, suspicious nodule/mass or endobronchial/endotracheal lesions.  No acute or suspicious bony abnormalities are noted. The visualized upper abdomen is unremarkable.  IMPRESSION: No evidence of pulmonary emboli or thoracic aortic aneurysm.  Cardiomegaly and coronary artery disease.  Very mild central ground-glass opacities which could represent mild interstitial edema.  Correlate clinically.  Original Report Authenticated By: Rosendo Gros, M.D.    Admission HPI:  The patient is a 78 yo woman, history of chronic anemia, s/p right knee replacement 5 days PTA, presenting with shortness of breath. The patient underwent right knee replacement 5 days PTA, with pre-op Hb = 11.4, and post-op Hb = 8.2 after transfusion of 2 units of blood. Since hospital discharge 3 days ago, the patient has been feeling progressively more shortness of breath, exacerbated by exertion, improved with rest. She also notes a 1-day history of sore throat and nasal congestion, but no fevers, no cough, and no sick contacts. She also notes mild nausea and decreased appetite, as well as mild constipation since her surgery, last BM this morning, with no BRBPR or melena. She has a history of normocytic anemia of unknown etiology, with a normal colonoscopy 8 years ago.  Admission Physical Exam Blood pressure 143/46, pulse 85, temperature 99 F (37.2 C), temperature source Oral, resp. rate 18, SpO2 100.00%.  General: alert, cooperative, and in no apparent distress HEENT: pupils equal round and reactive to light, vision grossly intact, oropharynx clear and non-erythematous  Neck: supple, no lymphadenopathy Lungs: clear to ascultation bilaterally, normal work of respiration, no wheezes, rales, ronchi Heart: regular rate and rhythm, no murmurs, gallops, or rubs Abdomen: soft, non-tender, non-distended, normal bowel sounds    Extremities: legs with 2+ bilateral pitting edema, R > L (though patient notes no change since hospital discharge), right knee wound clean, dry, and intact Neurologic: alert & oriented X3, cranial nerves II-XII intact, strength grossly intact, sensation intact to light touch  Admission Labs Basic Metabolic Panel:   Basename  08/25/11 1211   NA  139   K  3.3*   CL  104   CO2  --   GLUCOSE  133*   BUN  16   CREATININE  1.10   CALCIUM  --   MG  --   PHOS  --    CBC:   Basename  08/25/11 1211  08/25/11 1157   WBC  --  7.8   NEUTROABS  --  6.1   HGB  7.8*  7.8*   HCT  23.0*  22.9*   MCV  --  85.1   PLT  --  195  Hospital Course by problem list: 1. Symptomatic Anemia - The patient presented with progressive SOB following a R knee replacement surgery.  The patient had been managed with Lovenox, and CTA was negative for PE.  The patient was found to have a Hb = 11.4 approximately 1 week pre-op, Hb = 8.2 one day post-op, and Hb = 8.5 two days post-op.  The patient presented with an Hb of 7.8 (likely hemoconcentrated), which decreased to 7.1 after IV fluids, likely representing symptomatic anemia.  The patient noted no hematochezia, hematemesis, or hemoptysis, and FOBT was negative.  Of note, the patient reports receiving 2 units of pRBC's in the peri-op period, but the post-op note and discharge summary make no mention of this, and we are unsure of the timing in regards to the above hemoglobin levels.  The patient was transfused 2 units of pRBC's during this admission, with a rise in Hb from 7.1 to 8.8, with symptomatic improvement in SOB.  2. Chronic Anemia - The patient reports a history of anemia, of unclear etiology.  An anemia panel during this admission showed no obvious iron, B12, or folate deficiency, but may be inaccurate if the patient truly did receive a blood transfusion peri-operatively the week prior.  The patient will follow-up with her PCP.  3. Diabetes Mellitus - The  patient has an A1C of 5.7, managed on home Januvia.  The patient was maintained on SSI during hospitalization.  4. Hypertension - the patient's home losartan was held during her hospitalization, and restarted at discharge.  Time spent on discharge: 45 minutes  Discharge Vitals:  BP 141/56  Pulse 68  Temp(Src) 98 F (36.7 C) (Oral)  Resp 18  Ht 5\' 6"  (1.676 m)  Wt 188 lb 15 oz (85.7 kg)  BMI 30.49 kg/m2  SpO2 98%  Discharge Labs:  Results for orders placed during the hospital encounter of 08/25/11 (from the past 24 hour(s))  TYPE AND SCREEN     Status: Normal (Preliminary result)   Collection Time   08/26/11 11:35 AM      Component Value Range   ABO/RH(D) A NEG     Antibody Screen NEG     Sample Expiration 08/29/2011     Unit Number [id number]     Blood Component Type RED CELLS,LR     Unit division 00     Status of Unit ISSUED     Transfusion Status OK TO TRANSFUSE     Crossmatch Result Compatible     Unit Number [id number]     Blood Component Type RED CELLS,LR     Unit division 00     Status of Unit ISSUED     Transfusion Status OK TO TRANSFUSE     Crossmatch Result Compatible    PREPARE RBC (CROSSMATCH)     Status: Normal   Collection Time   08/26/11 11:35 AM      Component Value Range   Order Confirmation ORDER PROCESSED BY BLOOD BANK    GLUCOSE, CAPILLARY     Status: Abnormal   Collection Time   08/26/11 12:33 PM      Component Value Range   Glucose-Capillary 122 (*) 70 - 99 (mg/dL)   Comment 1 Documented in Chart     Comment 2 Notify RN    GLUCOSE, CAPILLARY     Status: Abnormal   Collection Time   08/26/11  4:56 PM      Component Value Range   Glucose-Capillary 136 (*) 70 - 99 (mg/dL)  Comment 1 Documented in Chart     Comment 2 Notify RN    GLUCOSE, CAPILLARY     Status: Abnormal   Collection Time   08/26/11  9:10 PM      Component Value Range   Glucose-Capillary 175 (*) 70 - 99 (mg/dL)   Comment 1 Notify RN    CBC     Status: Abnormal   Collection Time     08/27/11  2:38 AM      Component Value Range   WBC 9.3  4.0 - 10.5 (K/uL)   RBC 3.03 (*) 3.87 - 5.11 (MIL/uL)   Hemoglobin 8.8 (*) 12.0 - 15.0 (g/dL)   HCT 02.7 (*) 25.3 - 46.0 (%)   MCV 84.5  78.0 - 100.0 (fL)   MCH 29.0  26.0 - 34.0 (pg)   MCHC 34.4  30.0 - 36.0 (g/dL)   RDW 66.4  40.3 - 47.4 (%)   Platelets 197  150 - 400 (K/uL)  BASIC METABOLIC PANEL     Status: Abnormal   Collection Time   08/27/11  2:38 AM      Component Value Range   Sodium 135  135 - 145 (mEq/L)   Potassium 3.2 (*) 3.5 - 5.1 (mEq/L)   Chloride 102  96 - 112 (mEq/L)   CO2 26  19 - 32 (mEq/L)   Glucose, Bld 125 (*) 70 - 99 (mg/dL)   BUN 12  6 - 23 (mg/dL)   Creatinine, Ser 2.59  0.50 - 1.10 (mg/dL)   Calcium 8.6  8.4 - 56.3 (mg/dL)   GFR calc non Af Amer 56 (*) >90 (mL/min)   GFR calc Af Amer 65 (*) >90 (mL/min)  GLUCOSE, CAPILLARY     Status: Abnormal   Collection Time   08/27/11  8:19 AM      Component Value Range   Glucose-Capillary 121 (*) 70 - 99 (mg/dL)    Signed: Janalyn Harder 08/27/2011, 8:36 AM

## 2011-08-27 NOTE — Progress Notes (Signed)
Met with pt re HH resumption, as pt is s/p knee replacement. Pt was active with [facility], this CM will notify [facility] of pt hospitalization and plan to d/c. MD please order Home Health PT to be resumed.  Pt has been without CPM and with decreased activity since hospitalization so progression has been slowed. Will call [facility]. MD please write order to resume HHPT and OT.  Johny Shock RN MPH Case Manager [phone]

## 2011-08-28 DIAGNOSIS — IMO0001 Reserved for inherently not codable concepts without codable children: Secondary | ICD-10-CM | POA: Diagnosis not present

## 2011-08-28 DIAGNOSIS — Z471 Aftercare following joint replacement surgery: Secondary | ICD-10-CM | POA: Diagnosis not present

## 2011-08-28 DIAGNOSIS — E119 Type 2 diabetes mellitus without complications: Secondary | ICD-10-CM | POA: Diagnosis not present

## 2011-08-28 DIAGNOSIS — R269 Unspecified abnormalities of gait and mobility: Secondary | ICD-10-CM | POA: Diagnosis not present

## 2011-08-28 DIAGNOSIS — Z96659 Presence of unspecified artificial knee joint: Secondary | ICD-10-CM | POA: Diagnosis not present

## 2011-08-28 DIAGNOSIS — I10 Essential (primary) hypertension: Secondary | ICD-10-CM | POA: Diagnosis not present

## 2011-08-28 NOTE — Progress Notes (Signed)
Late Entry:  CARE MANAGEMENT NOTE 08/28/2011  Patient:  Sabrina Hunt, Sabrina Hunt   Account Number:  [id number]  Date Initiated:  08/27/2011  Documentation initiated by:  Orella Cushman  Subjective/Objective Assessment:   Pt active with [facility] related to TKR.     Action/Plan:   [facility] notifed of pt hospitalization and orders to resume services at d/c faxed to that agency.   Anticipated DC Date:  08/27/2011   Anticipated DC Plan:  HOME W HOME HEALTH SERVICES         [facility] Choice  HOME HEALTH   Choice offered to / List presented to:  C-1 Patient        HH arranged  HH-1 RN  HH-2 PT      HH agency  [facility]   Status of service:  Completed, signed off Medicare Important Message given?   (If response is "NO", the following Medicare IM given date fields will be blank) Date Medicare IM given:   Date Additional Medicare IM given:    Discharge Disposition:  HOME W HOME HEALTH SERVICES  Per UR Regulation:    If discussed at Long Length of Stay Meetings, dates discussed:    Comments:

## 2011-08-30 DIAGNOSIS — Z96659 Presence of unspecified artificial knee joint: Secondary | ICD-10-CM | POA: Diagnosis not present

## 2011-08-30 DIAGNOSIS — R269 Unspecified abnormalities of gait and mobility: Secondary | ICD-10-CM | POA: Diagnosis not present

## 2011-08-30 DIAGNOSIS — IMO0001 Reserved for inherently not codable concepts without codable children: Secondary | ICD-10-CM | POA: Diagnosis not present

## 2011-08-30 DIAGNOSIS — E119 Type 2 diabetes mellitus without complications: Secondary | ICD-10-CM | POA: Diagnosis not present

## 2011-08-30 DIAGNOSIS — Z471 Aftercare following joint replacement surgery: Secondary | ICD-10-CM | POA: Diagnosis not present

## 2011-08-30 DIAGNOSIS — I10 Essential (primary) hypertension: Secondary | ICD-10-CM | POA: Diagnosis not present

## 2011-08-31 DIAGNOSIS — IMO0001 Reserved for inherently not codable concepts without codable children: Secondary | ICD-10-CM | POA: Diagnosis not present

## 2011-08-31 DIAGNOSIS — Z96659 Presence of unspecified artificial knee joint: Secondary | ICD-10-CM | POA: Diagnosis not present

## 2011-08-31 DIAGNOSIS — I10 Essential (primary) hypertension: Secondary | ICD-10-CM | POA: Diagnosis not present

## 2011-08-31 DIAGNOSIS — Z471 Aftercare following joint replacement surgery: Secondary | ICD-10-CM | POA: Diagnosis not present

## 2011-08-31 DIAGNOSIS — R269 Unspecified abnormalities of gait and mobility: Secondary | ICD-10-CM | POA: Diagnosis not present

## 2011-08-31 DIAGNOSIS — E119 Type 2 diabetes mellitus without complications: Secondary | ICD-10-CM | POA: Diagnosis not present

## 2011-09-03 DIAGNOSIS — E119 Type 2 diabetes mellitus without complications: Secondary | ICD-10-CM | POA: Diagnosis not present

## 2011-09-03 DIAGNOSIS — R269 Unspecified abnormalities of gait and mobility: Secondary | ICD-10-CM | POA: Diagnosis not present

## 2011-09-03 DIAGNOSIS — Z471 Aftercare following joint replacement surgery: Secondary | ICD-10-CM | POA: Diagnosis not present

## 2011-09-03 DIAGNOSIS — I10 Essential (primary) hypertension: Secondary | ICD-10-CM | POA: Diagnosis not present

## 2011-09-03 DIAGNOSIS — Z96659 Presence of unspecified artificial knee joint: Secondary | ICD-10-CM | POA: Diagnosis not present

## 2011-09-03 DIAGNOSIS — D500 Iron deficiency anemia secondary to blood loss (chronic): Secondary | ICD-10-CM | POA: Diagnosis not present

## 2011-09-03 DIAGNOSIS — IMO0001 Reserved for inherently not codable concepts without codable children: Secondary | ICD-10-CM | POA: Diagnosis not present

## 2011-09-05 DIAGNOSIS — Z471 Aftercare following joint replacement surgery: Secondary | ICD-10-CM | POA: Diagnosis not present

## 2011-09-05 DIAGNOSIS — E119 Type 2 diabetes mellitus without complications: Secondary | ICD-10-CM | POA: Diagnosis not present

## 2011-09-05 DIAGNOSIS — R269 Unspecified abnormalities of gait and mobility: Secondary | ICD-10-CM | POA: Diagnosis not present

## 2011-09-05 DIAGNOSIS — IMO0001 Reserved for inherently not codable concepts without codable children: Secondary | ICD-10-CM | POA: Diagnosis not present

## 2011-09-05 DIAGNOSIS — I10 Essential (primary) hypertension: Secondary | ICD-10-CM | POA: Diagnosis not present

## 2011-09-05 DIAGNOSIS — Z96659 Presence of unspecified artificial knee joint: Secondary | ICD-10-CM | POA: Diagnosis not present

## 2011-09-07 DIAGNOSIS — R269 Unspecified abnormalities of gait and mobility: Secondary | ICD-10-CM | POA: Diagnosis not present

## 2011-09-07 DIAGNOSIS — Z96659 Presence of unspecified artificial knee joint: Secondary | ICD-10-CM | POA: Diagnosis not present

## 2011-09-07 DIAGNOSIS — Z471 Aftercare following joint replacement surgery: Secondary | ICD-10-CM | POA: Diagnosis not present

## 2011-09-07 DIAGNOSIS — IMO0001 Reserved for inherently not codable concepts without codable children: Secondary | ICD-10-CM | POA: Diagnosis not present

## 2011-09-07 DIAGNOSIS — E119 Type 2 diabetes mellitus without complications: Secondary | ICD-10-CM | POA: Diagnosis not present

## 2011-09-07 DIAGNOSIS — I10 Essential (primary) hypertension: Secondary | ICD-10-CM | POA: Diagnosis not present

## 2011-09-10 DIAGNOSIS — Z96659 Presence of unspecified artificial knee joint: Secondary | ICD-10-CM | POA: Diagnosis not present

## 2011-09-10 DIAGNOSIS — M25569 Pain in unspecified knee: Secondary | ICD-10-CM | POA: Diagnosis not present

## 2011-09-10 DIAGNOSIS — M6281 Muscle weakness (generalized): Secondary | ICD-10-CM | POA: Diagnosis not present

## 2011-09-10 DIAGNOSIS — R269 Unspecified abnormalities of gait and mobility: Secondary | ICD-10-CM | POA: Diagnosis not present

## 2011-09-12 DIAGNOSIS — M6281 Muscle weakness (generalized): Secondary | ICD-10-CM | POA: Diagnosis not present

## 2011-09-12 DIAGNOSIS — Z96659 Presence of unspecified artificial knee joint: Secondary | ICD-10-CM | POA: Diagnosis not present

## 2011-09-12 DIAGNOSIS — R269 Unspecified abnormalities of gait and mobility: Secondary | ICD-10-CM | POA: Diagnosis not present

## 2011-09-12 DIAGNOSIS — M25569 Pain in unspecified knee: Secondary | ICD-10-CM | POA: Diagnosis not present

## 2011-09-14 DIAGNOSIS — Z96659 Presence of unspecified artificial knee joint: Secondary | ICD-10-CM | POA: Diagnosis not present

## 2011-09-14 DIAGNOSIS — R269 Unspecified abnormalities of gait and mobility: Secondary | ICD-10-CM | POA: Diagnosis not present

## 2011-09-14 DIAGNOSIS — M25569 Pain in unspecified knee: Secondary | ICD-10-CM | POA: Diagnosis not present

## 2011-09-14 DIAGNOSIS — M6281 Muscle weakness (generalized): Secondary | ICD-10-CM | POA: Diagnosis not present

## 2011-09-17 DIAGNOSIS — M25569 Pain in unspecified knee: Secondary | ICD-10-CM | POA: Diagnosis not present

## 2011-09-17 DIAGNOSIS — Z96659 Presence of unspecified artificial knee joint: Secondary | ICD-10-CM | POA: Diagnosis not present

## 2011-09-17 DIAGNOSIS — M6281 Muscle weakness (generalized): Secondary | ICD-10-CM | POA: Diagnosis not present

## 2011-09-17 DIAGNOSIS — R269 Unspecified abnormalities of gait and mobility: Secondary | ICD-10-CM | POA: Diagnosis not present

## 2011-09-19 DIAGNOSIS — M6281 Muscle weakness (generalized): Secondary | ICD-10-CM | POA: Diagnosis not present

## 2011-09-19 DIAGNOSIS — Z96659 Presence of unspecified artificial knee joint: Secondary | ICD-10-CM | POA: Diagnosis not present

## 2011-09-19 DIAGNOSIS — R269 Unspecified abnormalities of gait and mobility: Secondary | ICD-10-CM | POA: Diagnosis not present

## 2011-09-19 DIAGNOSIS — M25569 Pain in unspecified knee: Secondary | ICD-10-CM | POA: Diagnosis not present

## 2011-09-20 DIAGNOSIS — R269 Unspecified abnormalities of gait and mobility: Secondary | ICD-10-CM | POA: Diagnosis not present

## 2011-09-20 DIAGNOSIS — Z96659 Presence of unspecified artificial knee joint: Secondary | ICD-10-CM | POA: Diagnosis not present

## 2011-09-20 DIAGNOSIS — M25569 Pain in unspecified knee: Secondary | ICD-10-CM | POA: Diagnosis not present

## 2011-09-20 DIAGNOSIS — M6281 Muscle weakness (generalized): Secondary | ICD-10-CM | POA: Diagnosis not present

## 2011-09-24 DIAGNOSIS — M6281 Muscle weakness (generalized): Secondary | ICD-10-CM | POA: Diagnosis not present

## 2011-09-24 DIAGNOSIS — Z96659 Presence of unspecified artificial knee joint: Secondary | ICD-10-CM | POA: Diagnosis not present

## 2011-09-24 DIAGNOSIS — M25569 Pain in unspecified knee: Secondary | ICD-10-CM | POA: Diagnosis not present

## 2011-09-24 DIAGNOSIS — R269 Unspecified abnormalities of gait and mobility: Secondary | ICD-10-CM | POA: Diagnosis not present

## 2011-09-25 DIAGNOSIS — R269 Unspecified abnormalities of gait and mobility: Secondary | ICD-10-CM | POA: Diagnosis not present

## 2011-09-25 DIAGNOSIS — Z96659 Presence of unspecified artificial knee joint: Secondary | ICD-10-CM | POA: Diagnosis not present

## 2011-09-25 DIAGNOSIS — M6281 Muscle weakness (generalized): Secondary | ICD-10-CM | POA: Diagnosis not present

## 2011-09-25 DIAGNOSIS — M25569 Pain in unspecified knee: Secondary | ICD-10-CM | POA: Diagnosis not present

## 2011-09-27 DIAGNOSIS — R269 Unspecified abnormalities of gait and mobility: Secondary | ICD-10-CM | POA: Diagnosis not present

## 2011-09-27 DIAGNOSIS — Z96659 Presence of unspecified artificial knee joint: Secondary | ICD-10-CM | POA: Diagnosis not present

## 2011-09-27 DIAGNOSIS — M6281 Muscle weakness (generalized): Secondary | ICD-10-CM | POA: Diagnosis not present

## 2011-09-27 DIAGNOSIS — M25569 Pain in unspecified knee: Secondary | ICD-10-CM | POA: Diagnosis not present

## 2011-10-01 DIAGNOSIS — M25569 Pain in unspecified knee: Secondary | ICD-10-CM | POA: Diagnosis not present

## 2011-10-01 DIAGNOSIS — M6281 Muscle weakness (generalized): Secondary | ICD-10-CM | POA: Diagnosis not present

## 2011-10-01 DIAGNOSIS — Z96659 Presence of unspecified artificial knee joint: Secondary | ICD-10-CM | POA: Diagnosis not present

## 2011-10-01 DIAGNOSIS — R269 Unspecified abnormalities of gait and mobility: Secondary | ICD-10-CM | POA: Diagnosis not present

## 2011-10-01 DIAGNOSIS — E119 Type 2 diabetes mellitus without complications: Secondary | ICD-10-CM | POA: Diagnosis not present

## 2011-10-01 DIAGNOSIS — D649 Anemia, unspecified: Secondary | ICD-10-CM | POA: Diagnosis not present

## 2011-10-01 DIAGNOSIS — E7800 Pure hypercholesterolemia, unspecified: Secondary | ICD-10-CM | POA: Diagnosis not present

## 2011-10-03 DIAGNOSIS — M6281 Muscle weakness (generalized): Secondary | ICD-10-CM | POA: Diagnosis not present

## 2011-10-03 DIAGNOSIS — M25569 Pain in unspecified knee: Secondary | ICD-10-CM | POA: Diagnosis not present

## 2011-10-03 DIAGNOSIS — R269 Unspecified abnormalities of gait and mobility: Secondary | ICD-10-CM | POA: Diagnosis not present

## 2011-10-03 DIAGNOSIS — Z96659 Presence of unspecified artificial knee joint: Secondary | ICD-10-CM | POA: Diagnosis not present

## 2011-10-04 DIAGNOSIS — Z96659 Presence of unspecified artificial knee joint: Secondary | ICD-10-CM | POA: Diagnosis not present

## 2011-10-04 DIAGNOSIS — M6281 Muscle weakness (generalized): Secondary | ICD-10-CM | POA: Diagnosis not present

## 2011-10-04 DIAGNOSIS — M25569 Pain in unspecified knee: Secondary | ICD-10-CM | POA: Diagnosis not present

## 2011-10-04 DIAGNOSIS — R269 Unspecified abnormalities of gait and mobility: Secondary | ICD-10-CM | POA: Diagnosis not present

## 2011-10-09 DIAGNOSIS — Z96659 Presence of unspecified artificial knee joint: Secondary | ICD-10-CM | POA: Diagnosis not present

## 2011-10-09 DIAGNOSIS — M25569 Pain in unspecified knee: Secondary | ICD-10-CM | POA: Diagnosis not present

## 2011-10-09 DIAGNOSIS — R269 Unspecified abnormalities of gait and mobility: Secondary | ICD-10-CM | POA: Diagnosis not present

## 2011-10-09 DIAGNOSIS — M6281 Muscle weakness (generalized): Secondary | ICD-10-CM | POA: Diagnosis not present

## 2011-10-10 DIAGNOSIS — R269 Unspecified abnormalities of gait and mobility: Secondary | ICD-10-CM | POA: Diagnosis not present

## 2011-10-10 DIAGNOSIS — M25569 Pain in unspecified knee: Secondary | ICD-10-CM | POA: Diagnosis not present

## 2011-10-10 DIAGNOSIS — M6281 Muscle weakness (generalized): Secondary | ICD-10-CM | POA: Diagnosis not present

## 2011-10-10 DIAGNOSIS — Z96659 Presence of unspecified artificial knee joint: Secondary | ICD-10-CM | POA: Diagnosis not present

## 2011-10-12 DIAGNOSIS — Z96659 Presence of unspecified artificial knee joint: Secondary | ICD-10-CM | POA: Diagnosis not present

## 2011-10-12 DIAGNOSIS — M6281 Muscle weakness (generalized): Secondary | ICD-10-CM | POA: Diagnosis not present

## 2011-10-12 DIAGNOSIS — M25569 Pain in unspecified knee: Secondary | ICD-10-CM | POA: Diagnosis not present

## 2011-10-12 DIAGNOSIS — R269 Unspecified abnormalities of gait and mobility: Secondary | ICD-10-CM | POA: Diagnosis not present

## 2011-10-15 DIAGNOSIS — R269 Unspecified abnormalities of gait and mobility: Secondary | ICD-10-CM | POA: Diagnosis not present

## 2011-10-15 DIAGNOSIS — M6281 Muscle weakness (generalized): Secondary | ICD-10-CM | POA: Diagnosis not present

## 2011-10-15 DIAGNOSIS — M25569 Pain in unspecified knee: Secondary | ICD-10-CM | POA: Diagnosis not present

## 2011-10-15 DIAGNOSIS — Z96659 Presence of unspecified artificial knee joint: Secondary | ICD-10-CM | POA: Diagnosis not present

## 2011-10-17 DIAGNOSIS — Z96659 Presence of unspecified artificial knee joint: Secondary | ICD-10-CM | POA: Diagnosis not present

## 2011-10-17 DIAGNOSIS — M25569 Pain in unspecified knee: Secondary | ICD-10-CM | POA: Diagnosis not present

## 2011-10-17 DIAGNOSIS — M75100 Unspecified rotator cuff tear or rupture of unspecified shoulder, not specified as traumatic: Secondary | ICD-10-CM | POA: Diagnosis not present

## 2011-10-17 DIAGNOSIS — M6281 Muscle weakness (generalized): Secondary | ICD-10-CM | POA: Diagnosis not present

## 2011-10-17 DIAGNOSIS — R269 Unspecified abnormalities of gait and mobility: Secondary | ICD-10-CM | POA: Diagnosis not present

## 2011-10-19 DIAGNOSIS — R269 Unspecified abnormalities of gait and mobility: Secondary | ICD-10-CM | POA: Diagnosis not present

## 2011-10-19 DIAGNOSIS — M6281 Muscle weakness (generalized): Secondary | ICD-10-CM | POA: Diagnosis not present

## 2011-10-19 DIAGNOSIS — M25569 Pain in unspecified knee: Secondary | ICD-10-CM | POA: Diagnosis not present

## 2011-10-19 DIAGNOSIS — Z96659 Presence of unspecified artificial knee joint: Secondary | ICD-10-CM | POA: Diagnosis not present

## 2011-10-22 DIAGNOSIS — Z96659 Presence of unspecified artificial knee joint: Secondary | ICD-10-CM | POA: Diagnosis not present

## 2011-10-22 DIAGNOSIS — R269 Unspecified abnormalities of gait and mobility: Secondary | ICD-10-CM | POA: Diagnosis not present

## 2011-10-22 DIAGNOSIS — M25569 Pain in unspecified knee: Secondary | ICD-10-CM | POA: Diagnosis not present

## 2011-10-22 DIAGNOSIS — M6281 Muscle weakness (generalized): Secondary | ICD-10-CM | POA: Diagnosis not present

## 2011-10-25 DIAGNOSIS — M6281 Muscle weakness (generalized): Secondary | ICD-10-CM | POA: Diagnosis not present

## 2011-10-25 DIAGNOSIS — Z96659 Presence of unspecified artificial knee joint: Secondary | ICD-10-CM | POA: Diagnosis not present

## 2011-10-25 DIAGNOSIS — R269 Unspecified abnormalities of gait and mobility: Secondary | ICD-10-CM | POA: Diagnosis not present

## 2011-10-25 DIAGNOSIS — M25569 Pain in unspecified knee: Secondary | ICD-10-CM | POA: Diagnosis not present

## 2011-10-29 DIAGNOSIS — M25569 Pain in unspecified knee: Secondary | ICD-10-CM | POA: Diagnosis not present

## 2011-10-29 DIAGNOSIS — R269 Unspecified abnormalities of gait and mobility: Secondary | ICD-10-CM | POA: Diagnosis not present

## 2011-10-29 DIAGNOSIS — M6281 Muscle weakness (generalized): Secondary | ICD-10-CM | POA: Diagnosis not present

## 2011-10-29 DIAGNOSIS — Z96659 Presence of unspecified artificial knee joint: Secondary | ICD-10-CM | POA: Diagnosis not present

## 2011-10-31 DIAGNOSIS — Z96659 Presence of unspecified artificial knee joint: Secondary | ICD-10-CM | POA: Diagnosis not present

## 2011-11-01 DIAGNOSIS — Z96659 Presence of unspecified artificial knee joint: Secondary | ICD-10-CM | POA: Diagnosis not present

## 2011-11-01 DIAGNOSIS — R269 Unspecified abnormalities of gait and mobility: Secondary | ICD-10-CM | POA: Diagnosis not present

## 2011-11-01 DIAGNOSIS — M25569 Pain in unspecified knee: Secondary | ICD-10-CM | POA: Diagnosis not present

## 2011-11-01 DIAGNOSIS — M6281 Muscle weakness (generalized): Secondary | ICD-10-CM | POA: Diagnosis not present

## 2011-11-02 DIAGNOSIS — R269 Unspecified abnormalities of gait and mobility: Secondary | ICD-10-CM | POA: Diagnosis not present

## 2011-11-02 DIAGNOSIS — M6281 Muscle weakness (generalized): Secondary | ICD-10-CM | POA: Diagnosis not present

## 2011-11-02 DIAGNOSIS — Z96659 Presence of unspecified artificial knee joint: Secondary | ICD-10-CM | POA: Diagnosis not present

## 2011-11-02 DIAGNOSIS — M25569 Pain in unspecified knee: Secondary | ICD-10-CM | POA: Diagnosis not present

## 2011-11-05 DIAGNOSIS — M25569 Pain in unspecified knee: Secondary | ICD-10-CM | POA: Diagnosis not present

## 2011-11-05 DIAGNOSIS — R269 Unspecified abnormalities of gait and mobility: Secondary | ICD-10-CM | POA: Diagnosis not present

## 2011-11-05 DIAGNOSIS — Z96659 Presence of unspecified artificial knee joint: Secondary | ICD-10-CM | POA: Diagnosis not present

## 2011-11-05 DIAGNOSIS — M6281 Muscle weakness (generalized): Secondary | ICD-10-CM | POA: Diagnosis not present

## 2011-11-06 DIAGNOSIS — L600 Ingrowing nail: Secondary | ICD-10-CM | POA: Diagnosis not present

## 2011-11-07 DIAGNOSIS — M6281 Muscle weakness (generalized): Secondary | ICD-10-CM | POA: Diagnosis not present

## 2011-11-07 DIAGNOSIS — Z96659 Presence of unspecified artificial knee joint: Secondary | ICD-10-CM | POA: Diagnosis not present

## 2011-11-07 DIAGNOSIS — R269 Unspecified abnormalities of gait and mobility: Secondary | ICD-10-CM | POA: Diagnosis not present

## 2011-11-07 DIAGNOSIS — M25569 Pain in unspecified knee: Secondary | ICD-10-CM | POA: Diagnosis not present

## 2011-11-08 DIAGNOSIS — M6281 Muscle weakness (generalized): Secondary | ICD-10-CM | POA: Diagnosis not present

## 2011-11-08 DIAGNOSIS — M25569 Pain in unspecified knee: Secondary | ICD-10-CM | POA: Diagnosis not present

## 2011-11-08 DIAGNOSIS — Z96659 Presence of unspecified artificial knee joint: Secondary | ICD-10-CM | POA: Diagnosis not present

## 2011-11-08 DIAGNOSIS — R269 Unspecified abnormalities of gait and mobility: Secondary | ICD-10-CM | POA: Diagnosis not present

## 2011-11-13 DIAGNOSIS — M6281 Muscle weakness (generalized): Secondary | ICD-10-CM | POA: Diagnosis not present

## 2011-11-13 DIAGNOSIS — M25569 Pain in unspecified knee: Secondary | ICD-10-CM | POA: Diagnosis not present

## 2011-11-13 DIAGNOSIS — R269 Unspecified abnormalities of gait and mobility: Secondary | ICD-10-CM | POA: Diagnosis not present

## 2011-11-13 DIAGNOSIS — Z96659 Presence of unspecified artificial knee joint: Secondary | ICD-10-CM | POA: Diagnosis not present

## 2011-11-16 DIAGNOSIS — M6281 Muscle weakness (generalized): Secondary | ICD-10-CM | POA: Diagnosis not present

## 2011-11-16 DIAGNOSIS — M25569 Pain in unspecified knee: Secondary | ICD-10-CM | POA: Diagnosis not present

## 2011-11-16 DIAGNOSIS — Z96659 Presence of unspecified artificial knee joint: Secondary | ICD-10-CM | POA: Diagnosis not present

## 2011-11-16 DIAGNOSIS — R269 Unspecified abnormalities of gait and mobility: Secondary | ICD-10-CM | POA: Diagnosis not present

## 2011-11-20 DIAGNOSIS — M25569 Pain in unspecified knee: Secondary | ICD-10-CM | POA: Diagnosis not present

## 2011-11-20 DIAGNOSIS — Z96659 Presence of unspecified artificial knee joint: Secondary | ICD-10-CM | POA: Diagnosis not present

## 2011-11-20 DIAGNOSIS — M6281 Muscle weakness (generalized): Secondary | ICD-10-CM | POA: Diagnosis not present

## 2011-11-20 DIAGNOSIS — R269 Unspecified abnormalities of gait and mobility: Secondary | ICD-10-CM | POA: Diagnosis not present

## 2011-11-22 DIAGNOSIS — Z96659 Presence of unspecified artificial knee joint: Secondary | ICD-10-CM | POA: Diagnosis not present

## 2011-11-22 DIAGNOSIS — R269 Unspecified abnormalities of gait and mobility: Secondary | ICD-10-CM | POA: Diagnosis not present

## 2011-11-22 DIAGNOSIS — M6281 Muscle weakness (generalized): Secondary | ICD-10-CM | POA: Diagnosis not present

## 2011-11-22 DIAGNOSIS — M25569 Pain in unspecified knee: Secondary | ICD-10-CM | POA: Diagnosis not present

## 2011-11-27 DIAGNOSIS — M25569 Pain in unspecified knee: Secondary | ICD-10-CM | POA: Diagnosis not present

## 2011-11-27 DIAGNOSIS — R269 Unspecified abnormalities of gait and mobility: Secondary | ICD-10-CM | POA: Diagnosis not present

## 2011-11-27 DIAGNOSIS — M6281 Muscle weakness (generalized): Secondary | ICD-10-CM | POA: Diagnosis not present

## 2011-11-27 DIAGNOSIS — Z96659 Presence of unspecified artificial knee joint: Secondary | ICD-10-CM | POA: Diagnosis not present

## 2011-11-29 DIAGNOSIS — M25569 Pain in unspecified knee: Secondary | ICD-10-CM | POA: Diagnosis not present

## 2011-11-29 DIAGNOSIS — Z96659 Presence of unspecified artificial knee joint: Secondary | ICD-10-CM | POA: Diagnosis not present

## 2011-11-29 DIAGNOSIS — R269 Unspecified abnormalities of gait and mobility: Secondary | ICD-10-CM | POA: Diagnosis not present

## 2011-11-29 DIAGNOSIS — M6281 Muscle weakness (generalized): Secondary | ICD-10-CM | POA: Diagnosis not present

## 2011-12-03 DIAGNOSIS — M6281 Muscle weakness (generalized): Secondary | ICD-10-CM | POA: Diagnosis not present

## 2011-12-03 DIAGNOSIS — M25569 Pain in unspecified knee: Secondary | ICD-10-CM | POA: Diagnosis not present

## 2011-12-03 DIAGNOSIS — Z96659 Presence of unspecified artificial knee joint: Secondary | ICD-10-CM | POA: Diagnosis not present

## 2011-12-03 DIAGNOSIS — R269 Unspecified abnormalities of gait and mobility: Secondary | ICD-10-CM | POA: Diagnosis not present

## 2011-12-05 DIAGNOSIS — R269 Unspecified abnormalities of gait and mobility: Secondary | ICD-10-CM | POA: Diagnosis not present

## 2011-12-05 DIAGNOSIS — M6281 Muscle weakness (generalized): Secondary | ICD-10-CM | POA: Diagnosis not present

## 2011-12-05 DIAGNOSIS — M25569 Pain in unspecified knee: Secondary | ICD-10-CM | POA: Diagnosis not present

## 2011-12-05 DIAGNOSIS — Z96659 Presence of unspecified artificial knee joint: Secondary | ICD-10-CM | POA: Diagnosis not present

## 2012-01-10 DIAGNOSIS — L600 Ingrowing nail: Secondary | ICD-10-CM | POA: Diagnosis not present

## 2012-01-10 DIAGNOSIS — E119 Type 2 diabetes mellitus without complications: Secondary | ICD-10-CM | POA: Diagnosis not present

## 2012-01-31 DIAGNOSIS — Z1231 Encounter for screening mammogram for malignant neoplasm of breast: Secondary | ICD-10-CM | POA: Diagnosis not present

## 2012-02-07 DIAGNOSIS — Z23 Encounter for immunization: Secondary | ICD-10-CM | POA: Diagnosis not present

## 2012-02-07 DIAGNOSIS — J069 Acute upper respiratory infection, unspecified: Secondary | ICD-10-CM | POA: Diagnosis not present

## 2012-03-26 DIAGNOSIS — E1159 Type 2 diabetes mellitus with other circulatory complications: Secondary | ICD-10-CM | POA: Diagnosis not present

## 2012-05-23 DIAGNOSIS — M949 Disorder of cartilage, unspecified: Secondary | ICD-10-CM | POA: Diagnosis not present

## 2012-05-23 DIAGNOSIS — Z79899 Other long term (current) drug therapy: Secondary | ICD-10-CM | POA: Diagnosis not present

## 2012-05-23 DIAGNOSIS — D649 Anemia, unspecified: Secondary | ICD-10-CM | POA: Diagnosis not present

## 2012-05-23 DIAGNOSIS — M899 Disorder of bone, unspecified: Secondary | ICD-10-CM | POA: Diagnosis not present

## 2012-05-23 DIAGNOSIS — E119 Type 2 diabetes mellitus without complications: Secondary | ICD-10-CM | POA: Diagnosis not present

## 2012-05-23 DIAGNOSIS — E7800 Pure hypercholesterolemia, unspecified: Secondary | ICD-10-CM | POA: Diagnosis not present

## 2012-06-30 DIAGNOSIS — J069 Acute upper respiratory infection, unspecified: Secondary | ICD-10-CM | POA: Diagnosis not present

## 2012-06-30 DIAGNOSIS — R062 Wheezing: Secondary | ICD-10-CM | POA: Diagnosis not present

## 2012-07-10 DIAGNOSIS — J189 Pneumonia, unspecified organism: Secondary | ICD-10-CM | POA: Diagnosis not present

## 2012-08-07 DIAGNOSIS — E119 Type 2 diabetes mellitus without complications: Secondary | ICD-10-CM | POA: Diagnosis not present

## 2012-08-07 DIAGNOSIS — L03039 Cellulitis of unspecified toe: Secondary | ICD-10-CM | POA: Diagnosis not present

## 2012-08-14 DIAGNOSIS — M25519 Pain in unspecified shoulder: Secondary | ICD-10-CM

## 2012-08-14 DIAGNOSIS — Z96659 Presence of unspecified artificial knee joint: Secondary | ICD-10-CM

## 2012-08-14 HISTORY — DX: Presence of unspecified artificial knee joint: Z96.659

## 2012-08-14 HISTORY — DX: Pain in unspecified shoulder: M25.519

## 2012-09-12 DIAGNOSIS — H2510 Age-related nuclear cataract, unspecified eye: Secondary | ICD-10-CM | POA: Diagnosis not present

## 2012-10-14 DIAGNOSIS — E119 Type 2 diabetes mellitus without complications: Secondary | ICD-10-CM | POA: Diagnosis not present

## 2012-10-14 DIAGNOSIS — E1159 Type 2 diabetes mellitus with other circulatory complications: Secondary | ICD-10-CM | POA: Diagnosis not present

## 2012-10-14 DIAGNOSIS — L03039 Cellulitis of unspecified toe: Secondary | ICD-10-CM | POA: Diagnosis not present

## 2012-10-29 DIAGNOSIS — L578 Other skin changes due to chronic exposure to nonionizing radiation: Secondary | ICD-10-CM | POA: Diagnosis not present

## 2012-10-29 DIAGNOSIS — K5900 Constipation, unspecified: Secondary | ICD-10-CM | POA: Diagnosis not present

## 2012-10-29 DIAGNOSIS — D500 Iron deficiency anemia secondary to blood loss (chronic): Secondary | ICD-10-CM | POA: Diagnosis not present

## 2012-10-29 DIAGNOSIS — R1310 Dysphagia, unspecified: Secondary | ICD-10-CM | POA: Diagnosis not present

## 2012-10-29 DIAGNOSIS — L570 Actinic keratosis: Secondary | ICD-10-CM | POA: Diagnosis not present

## 2012-10-29 DIAGNOSIS — R233 Spontaneous ecchymoses: Secondary | ICD-10-CM | POA: Diagnosis not present

## 2012-10-29 DIAGNOSIS — Z1211 Encounter for screening for malignant neoplasm of colon: Secondary | ICD-10-CM | POA: Diagnosis not present

## 2012-10-29 DIAGNOSIS — L821 Other seborrheic keratosis: Secondary | ICD-10-CM | POA: Diagnosis not present

## 2012-11-28 DIAGNOSIS — K2960 Other gastritis without bleeding: Secondary | ICD-10-CM | POA: Diagnosis not present

## 2012-11-28 DIAGNOSIS — R1314 Dysphagia, pharyngoesophageal phase: Secondary | ICD-10-CM | POA: Diagnosis not present

## 2012-11-28 DIAGNOSIS — K922 Gastrointestinal hemorrhage, unspecified: Secondary | ICD-10-CM | POA: Diagnosis not present

## 2012-11-28 DIAGNOSIS — D500 Iron deficiency anemia secondary to blood loss (chronic): Secondary | ICD-10-CM | POA: Diagnosis not present

## 2012-11-28 DIAGNOSIS — K319 Disease of stomach and duodenum, unspecified: Secondary | ICD-10-CM | POA: Diagnosis not present

## 2012-11-28 DIAGNOSIS — Z1211 Encounter for screening for malignant neoplasm of colon: Secondary | ICD-10-CM | POA: Diagnosis not present

## 2012-12-18 DIAGNOSIS — Z006 Encounter for examination for normal comparison and control in clinical research program: Secondary | ICD-10-CM | POA: Diagnosis not present

## 2012-12-18 DIAGNOSIS — Z0000 Encounter for general adult medical examination without abnormal findings: Secondary | ICD-10-CM | POA: Diagnosis not present

## 2012-12-18 DIAGNOSIS — E7800 Pure hypercholesterolemia, unspecified: Secondary | ICD-10-CM | POA: Diagnosis not present

## 2012-12-18 DIAGNOSIS — E119 Type 2 diabetes mellitus without complications: Secondary | ICD-10-CM | POA: Diagnosis not present

## 2012-12-18 DIAGNOSIS — I10 Essential (primary) hypertension: Secondary | ICD-10-CM | POA: Diagnosis not present

## 2012-12-18 DIAGNOSIS — D649 Anemia, unspecified: Secondary | ICD-10-CM | POA: Diagnosis not present

## 2012-12-23 DIAGNOSIS — E1159 Type 2 diabetes mellitus with other circulatory complications: Secondary | ICD-10-CM | POA: Diagnosis not present

## 2012-12-23 DIAGNOSIS — L03039 Cellulitis of unspecified toe: Secondary | ICD-10-CM | POA: Diagnosis not present

## 2012-12-23 DIAGNOSIS — E119 Type 2 diabetes mellitus without complications: Secondary | ICD-10-CM | POA: Diagnosis not present

## 2013-01-26 DIAGNOSIS — Z23 Encounter for immunization: Secondary | ICD-10-CM | POA: Diagnosis not present

## 2013-02-03 DIAGNOSIS — Z1231 Encounter for screening mammogram for malignant neoplasm of breast: Secondary | ICD-10-CM | POA: Diagnosis not present

## 2013-04-07 DIAGNOSIS — J0100 Acute maxillary sinusitis, unspecified: Secondary | ICD-10-CM | POA: Diagnosis not present

## 2013-04-30 DIAGNOSIS — L578 Other skin changes due to chronic exposure to nonionizing radiation: Secondary | ICD-10-CM | POA: Diagnosis not present

## 2013-04-30 DIAGNOSIS — L570 Actinic keratosis: Secondary | ICD-10-CM | POA: Diagnosis not present

## 2013-04-30 DIAGNOSIS — L821 Other seborrheic keratosis: Secondary | ICD-10-CM | POA: Diagnosis not present

## 2013-06-02 IMAGING — CT CT ANGIO CHEST
2 of 7 series · 18 of 36 positions shown · IV contrast (CONTRAST)
Comparison: 08/13/2011 chest radiograph

CLINICAL DATA: Shortness of breath and fatigue.  Recent knee
surgery.

CT ANGIOGRAPHY CHEST
TECHNIQUE: Multidetector CT imaging of the chest using the
standard protocol during bolus administration of intravenous
contrast. Multiplanar reconstructed images including MIPs were
obtained and reviewed to evaluate the vascular anatomy.
Contrast: 50mL OMNIPAQUE IOHEXOL 350 MG/ML SOLN

[Series 5: pe thins · axial · 0.67mm/px · z∈[+290,+548]mm · 17 of 291 slices shown]
[im 17/291  lung]
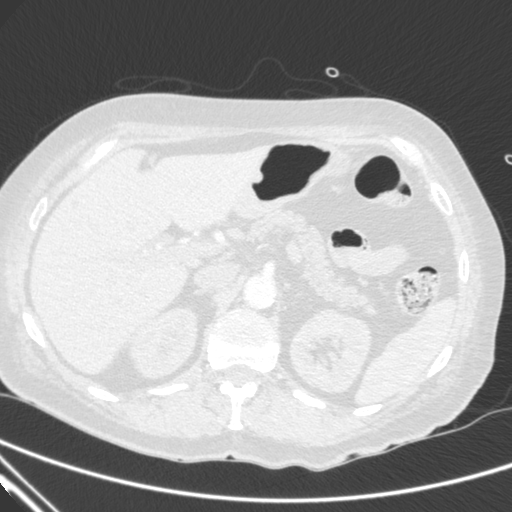
[im 33/291  mediastinal]
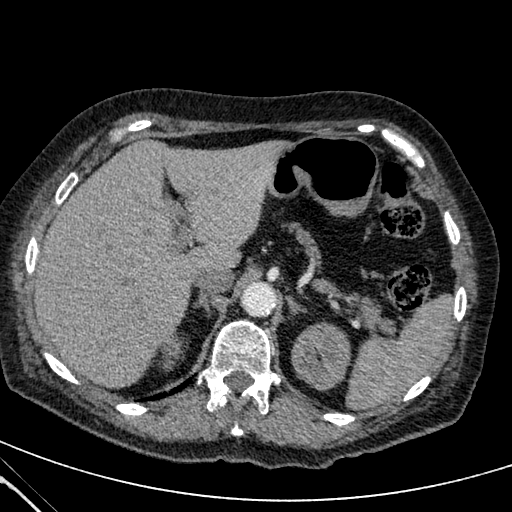
[im 49/291  lung]
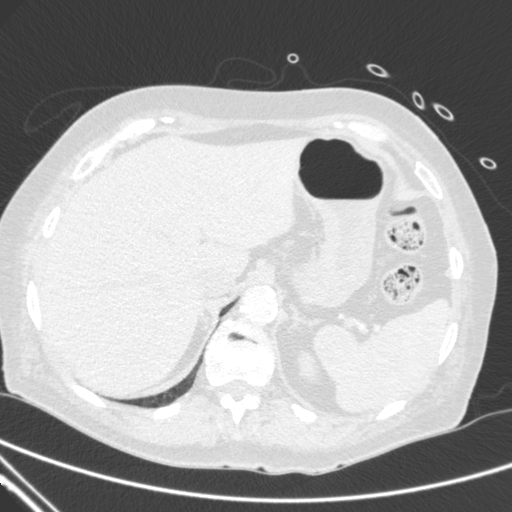
[im 65/291  mediastinal]
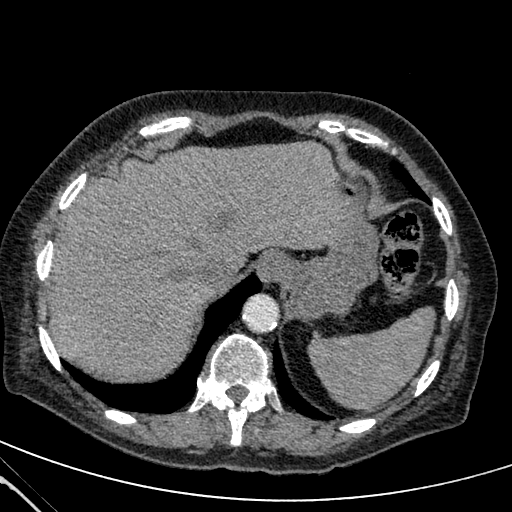
[im 81/291  lung]
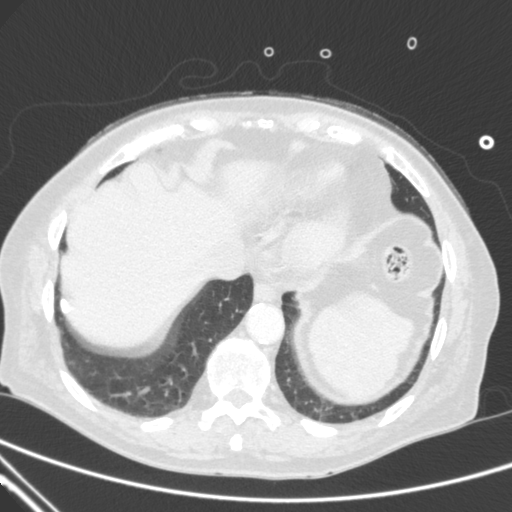
[im 97/291  mediastinal]
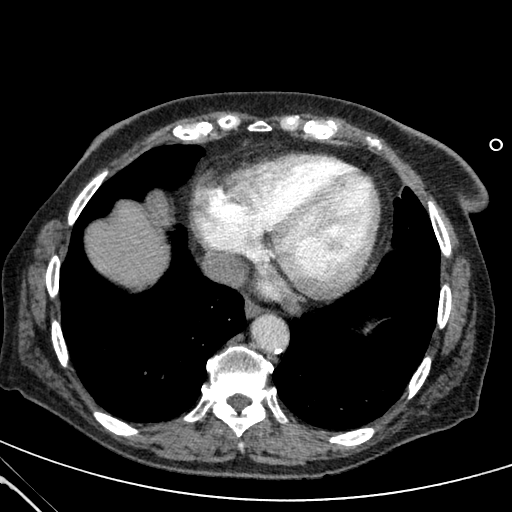
[im 113/291  lung]
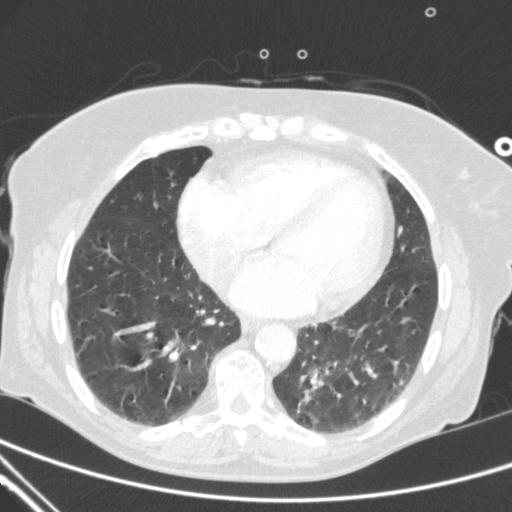
[im 129/291  mediastinal]
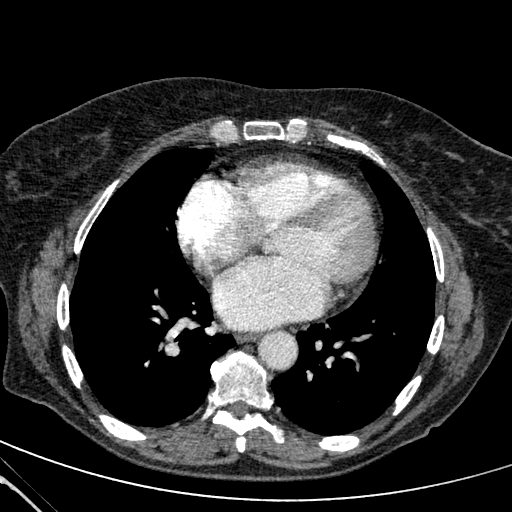
[im 146/291  lung]
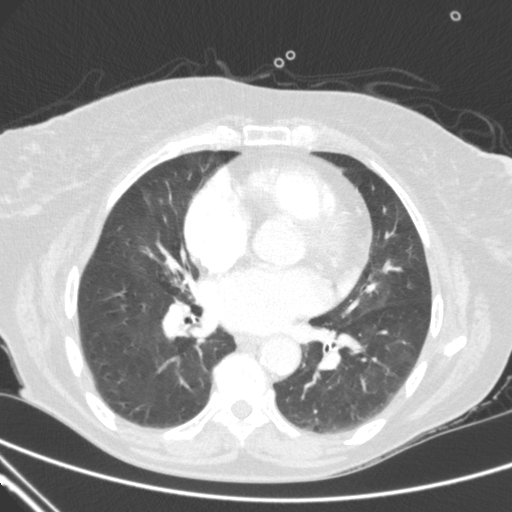
[im 162/291  mediastinal]
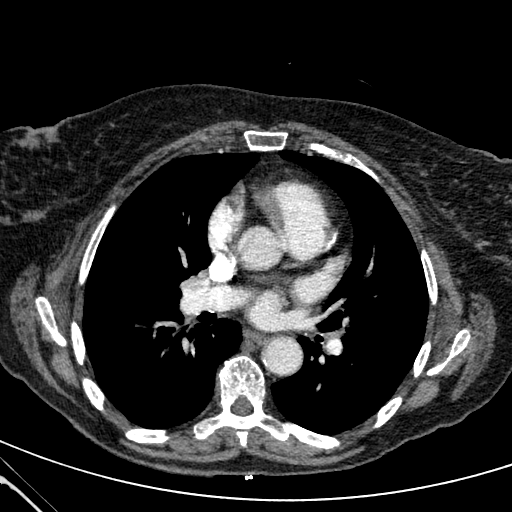
[im 178/291  lung]
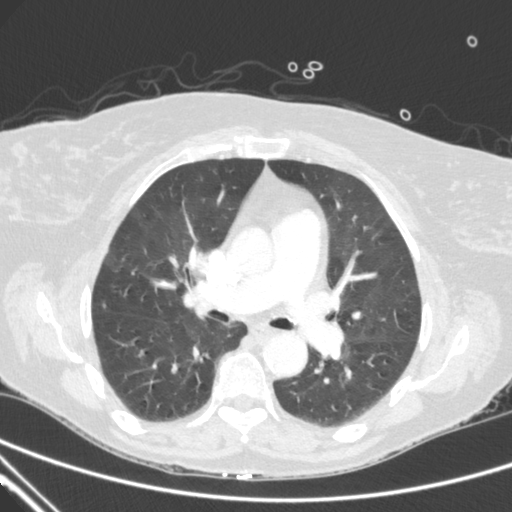
[im 194/291  mediastinal]
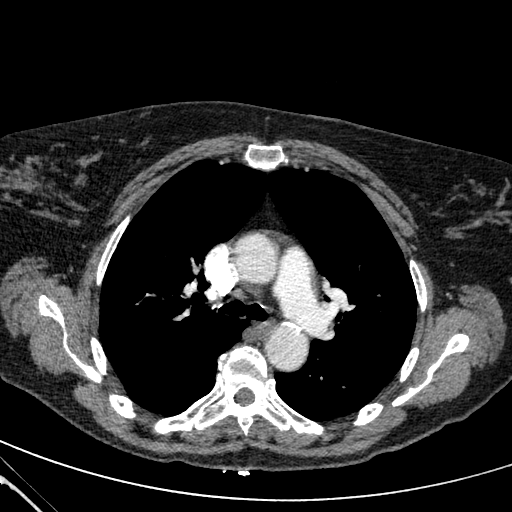
[im 210/291  lung]
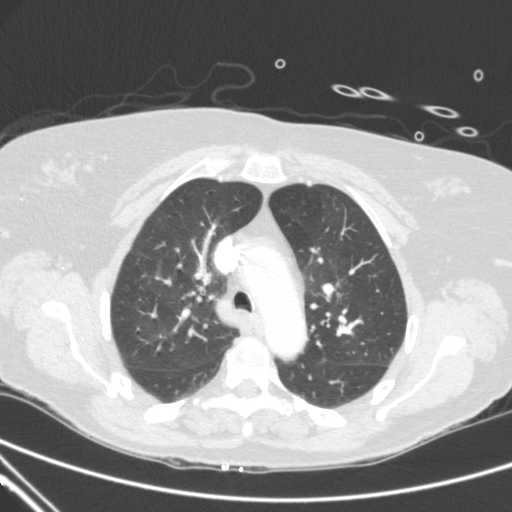
[im 226/291  mediastinal]
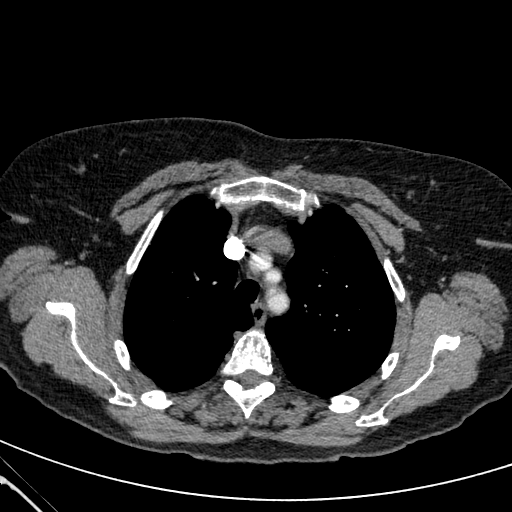
[im 242/291  lung]
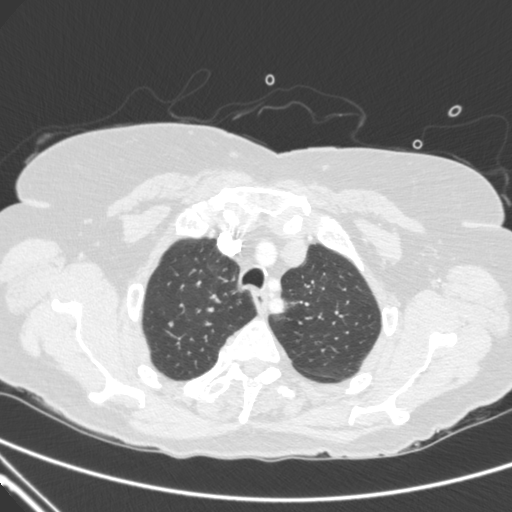
[im 258/291  mediastinal]
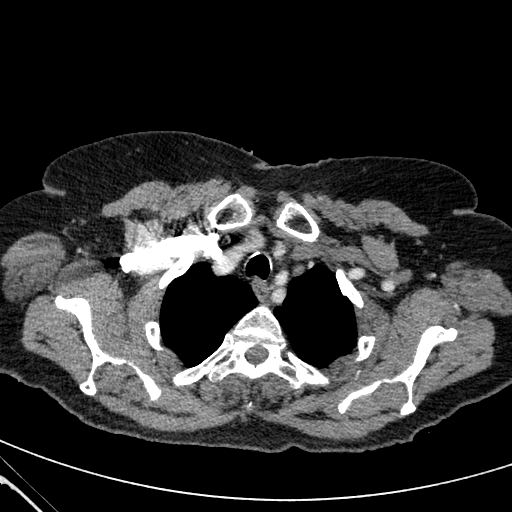
[im 274/291  lung]
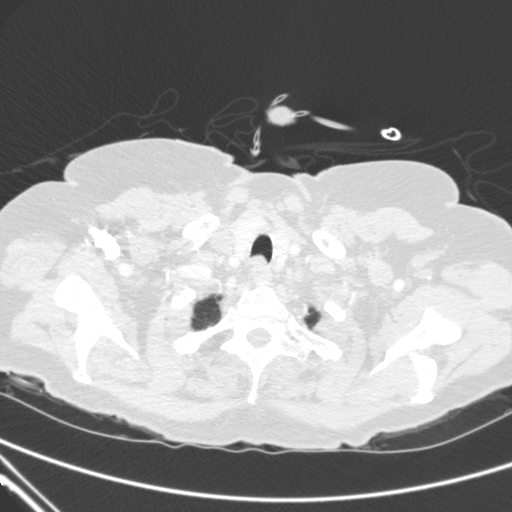

[mpr, coronals, coronal · coronal · 0.67mm/px · 1 of 126 slices shown]
[im 63/126  mediastinal]
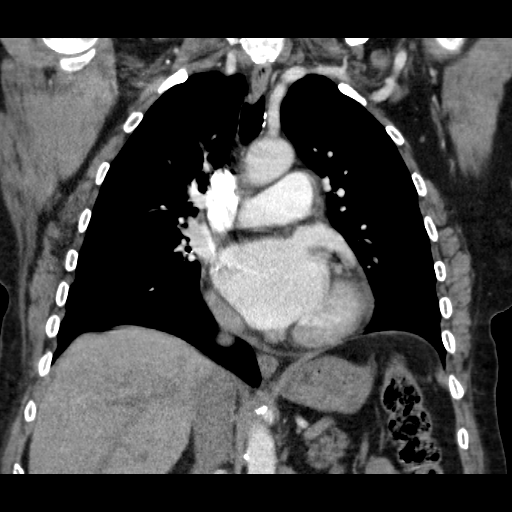

[18 of 36 positions shown; findings below may reference images not displayed]

FINDINGS: This study is technically adequate.
No pulmonary emboli are identified.
There is no evidence of thoracic aortic aneurysm.

Cardiomegaly and moderate to heavy calcification in the LAD noted.
There are no pleural or pericardial effusions present.
No enlarged lymph nodes are identified.

Very mild central ground-glass opacities are noted.  Mild
interstitial edema is not excluded.

There is no evidence of airspace disease, consolidation, suspicious
nodule/mass or endobronchial/endotracheal lesions.

No acute or suspicious bony abnormalities are noted.
The visualized upper abdomen is unremarkable.
IMPRESSION: No evidence of pulmonary emboli or thoracic aortic aneurysm.

Cardiomegaly and coronary artery disease.

Very mild central ground-glass opacities which could represent mild
interstitial edema.  Correlate clinically.

## 2013-06-15 DIAGNOSIS — E119 Type 2 diabetes mellitus without complications: Secondary | ICD-10-CM | POA: Diagnosis not present

## 2013-06-15 DIAGNOSIS — E7800 Pure hypercholesterolemia, unspecified: Secondary | ICD-10-CM | POA: Diagnosis not present

## 2013-06-22 DIAGNOSIS — M76899 Other specified enthesopathies of unspecified lower limb, excluding foot: Secondary | ICD-10-CM | POA: Diagnosis not present

## 2013-06-22 DIAGNOSIS — I10 Essential (primary) hypertension: Secondary | ICD-10-CM | POA: Diagnosis not present

## 2013-06-22 DIAGNOSIS — E1129 Type 2 diabetes mellitus with other diabetic kidney complication: Secondary | ICD-10-CM | POA: Diagnosis not present

## 2013-06-22 DIAGNOSIS — E119 Type 2 diabetes mellitus without complications: Secondary | ICD-10-CM | POA: Diagnosis not present

## 2013-06-22 DIAGNOSIS — Z23 Encounter for immunization: Secondary | ICD-10-CM | POA: Diagnosis not present

## 2013-06-22 DIAGNOSIS — E7800 Pure hypercholesterolemia, unspecified: Secondary | ICD-10-CM | POA: Diagnosis not present

## 2013-06-25 DIAGNOSIS — S81009A Unspecified open wound, unspecified knee, initial encounter: Secondary | ICD-10-CM | POA: Diagnosis not present

## 2013-07-03 DIAGNOSIS — H40019 Open angle with borderline findings, low risk, unspecified eye: Secondary | ICD-10-CM | POA: Diagnosis not present

## 2013-07-03 DIAGNOSIS — H2510 Age-related nuclear cataract, unspecified eye: Secondary | ICD-10-CM | POA: Diagnosis not present

## 2013-07-03 DIAGNOSIS — H40039 Anatomical narrow angle, unspecified eye: Secondary | ICD-10-CM | POA: Diagnosis not present

## 2013-07-20 DIAGNOSIS — M25559 Pain in unspecified hip: Secondary | ICD-10-CM | POA: Diagnosis not present

## 2013-07-21 DIAGNOSIS — H40039 Anatomical narrow angle, unspecified eye: Secondary | ICD-10-CM | POA: Diagnosis not present

## 2013-07-21 DIAGNOSIS — H52209 Unspecified astigmatism, unspecified eye: Secondary | ICD-10-CM | POA: Diagnosis not present

## 2013-07-21 DIAGNOSIS — H2589 Other age-related cataract: Secondary | ICD-10-CM | POA: Diagnosis not present

## 2013-07-21 DIAGNOSIS — E119 Type 2 diabetes mellitus without complications: Secondary | ICD-10-CM | POA: Diagnosis not present

## 2013-07-21 DIAGNOSIS — Z7982 Long term (current) use of aspirin: Secondary | ICD-10-CM | POA: Diagnosis not present

## 2013-07-21 DIAGNOSIS — I10 Essential (primary) hypertension: Secondary | ICD-10-CM | POA: Diagnosis not present

## 2013-07-21 DIAGNOSIS — H40019 Open angle with borderline findings, low risk, unspecified eye: Secondary | ICD-10-CM | POA: Diagnosis not present

## 2013-07-21 DIAGNOSIS — E7800 Pure hypercholesterolemia, unspecified: Secondary | ICD-10-CM | POA: Diagnosis not present

## 2013-07-21 DIAGNOSIS — Z79899 Other long term (current) drug therapy: Secondary | ICD-10-CM | POA: Diagnosis not present

## 2013-07-21 DIAGNOSIS — H2510 Age-related nuclear cataract, unspecified eye: Secondary | ICD-10-CM | POA: Diagnosis not present

## 2013-07-21 DIAGNOSIS — Z862 Personal history of diseases of the blood and blood-forming organs and certain disorders involving the immune mechanism: Secondary | ICD-10-CM | POA: Diagnosis not present

## 2013-08-04 DIAGNOSIS — H269 Unspecified cataract: Secondary | ICD-10-CM | POA: Diagnosis not present

## 2013-08-04 DIAGNOSIS — E785 Hyperlipidemia, unspecified: Secondary | ICD-10-CM | POA: Diagnosis not present

## 2013-08-04 DIAGNOSIS — H2510 Age-related nuclear cataract, unspecified eye: Secondary | ICD-10-CM | POA: Diagnosis not present

## 2013-08-04 DIAGNOSIS — H268 Other specified cataract: Secondary | ICD-10-CM | POA: Diagnosis not present

## 2013-08-04 DIAGNOSIS — E119 Type 2 diabetes mellitus without complications: Secondary | ICD-10-CM | POA: Diagnosis not present

## 2013-08-04 DIAGNOSIS — I10 Essential (primary) hypertension: Secondary | ICD-10-CM | POA: Diagnosis not present

## 2013-08-04 DIAGNOSIS — K219 Gastro-esophageal reflux disease without esophagitis: Secondary | ICD-10-CM | POA: Diagnosis not present

## 2013-08-04 DIAGNOSIS — Z79899 Other long term (current) drug therapy: Secondary | ICD-10-CM | POA: Diagnosis not present

## 2013-10-06 DIAGNOSIS — L600 Ingrowing nail: Secondary | ICD-10-CM | POA: Diagnosis not present

## 2013-10-14 DIAGNOSIS — M76899 Other specified enthesopathies of unspecified lower limb, excluding foot: Secondary | ICD-10-CM | POA: Diagnosis not present

## 2013-10-17 DIAGNOSIS — M5137 Other intervertebral disc degeneration, lumbosacral region: Secondary | ICD-10-CM | POA: Diagnosis not present

## 2013-10-17 DIAGNOSIS — M5430 Sciatica, unspecified side: Secondary | ICD-10-CM | POA: Diagnosis not present

## 2013-10-17 DIAGNOSIS — M79609 Pain in unspecified limb: Secondary | ICD-10-CM | POA: Diagnosis not present

## 2013-10-19 DIAGNOSIS — M5430 Sciatica, unspecified side: Secondary | ICD-10-CM | POA: Diagnosis not present

## 2013-10-19 DIAGNOSIS — H1030 Unspecified acute conjunctivitis, unspecified eye: Secondary | ICD-10-CM | POA: Diagnosis not present

## 2013-10-21 DIAGNOSIS — L981 Factitial dermatitis: Secondary | ICD-10-CM | POA: Diagnosis not present

## 2013-10-21 DIAGNOSIS — M5430 Sciatica, unspecified side: Secondary | ICD-10-CM | POA: Diagnosis not present

## 2013-10-21 DIAGNOSIS — L570 Actinic keratosis: Secondary | ICD-10-CM | POA: Diagnosis not present

## 2013-10-27 DIAGNOSIS — M5430 Sciatica, unspecified side: Secondary | ICD-10-CM | POA: Diagnosis not present

## 2013-10-29 DIAGNOSIS — M5106 Intervertebral disc disorders with myelopathy, lumbar region: Secondary | ICD-10-CM | POA: Diagnosis not present

## 2013-10-29 DIAGNOSIS — Z66 Do not resuscitate: Secondary | ICD-10-CM | POA: Diagnosis not present

## 2013-10-29 DIAGNOSIS — M5126 Other intervertebral disc displacement, lumbar region: Secondary | ICD-10-CM | POA: Diagnosis not present

## 2013-10-29 DIAGNOSIS — M48061 Spinal stenosis, lumbar region without neurogenic claudication: Secondary | ICD-10-CM | POA: Diagnosis not present

## 2013-10-29 DIAGNOSIS — M5430 Sciatica, unspecified side: Secondary | ICD-10-CM | POA: Diagnosis not present

## 2013-10-29 DIAGNOSIS — M47817 Spondylosis without myelopathy or radiculopathy, lumbosacral region: Secondary | ICD-10-CM | POA: Diagnosis not present

## 2013-11-05 DIAGNOSIS — M48062 Spinal stenosis, lumbar region with neurogenic claudication: Secondary | ICD-10-CM | POA: Diagnosis not present

## 2013-11-05 DIAGNOSIS — I10 Essential (primary) hypertension: Secondary | ICD-10-CM | POA: Diagnosis not present

## 2013-11-12 DIAGNOSIS — M48062 Spinal stenosis, lumbar region with neurogenic claudication: Secondary | ICD-10-CM | POA: Diagnosis not present

## 2013-11-19 DIAGNOSIS — M47817 Spondylosis without myelopathy or radiculopathy, lumbosacral region: Secondary | ICD-10-CM | POA: Diagnosis not present

## 2013-11-19 DIAGNOSIS — M48061 Spinal stenosis, lumbar region without neurogenic claudication: Secondary | ICD-10-CM | POA: Diagnosis not present

## 2013-11-19 DIAGNOSIS — M5126 Other intervertebral disc displacement, lumbar region: Secondary | ICD-10-CM | POA: Diagnosis not present

## 2013-11-19 DIAGNOSIS — M48062 Spinal stenosis, lumbar region with neurogenic claudication: Secondary | ICD-10-CM | POA: Diagnosis not present

## 2013-12-09 DIAGNOSIS — L608 Other nail disorders: Secondary | ICD-10-CM | POA: Diagnosis not present

## 2013-12-09 DIAGNOSIS — M79609 Pain in unspecified limb: Secondary | ICD-10-CM | POA: Diagnosis not present

## 2013-12-24 DIAGNOSIS — IMO0002 Reserved for concepts with insufficient information to code with codable children: Secondary | ICD-10-CM | POA: Diagnosis not present

## 2013-12-24 DIAGNOSIS — M48061 Spinal stenosis, lumbar region without neurogenic claudication: Secondary | ICD-10-CM | POA: Diagnosis not present

## 2013-12-24 DIAGNOSIS — M5126 Other intervertebral disc displacement, lumbar region: Secondary | ICD-10-CM | POA: Diagnosis not present

## 2013-12-24 DIAGNOSIS — Z6827 Body mass index (BMI) 27.0-27.9, adult: Secondary | ICD-10-CM | POA: Diagnosis not present

## 2013-12-24 DIAGNOSIS — M47817 Spondylosis without myelopathy or radiculopathy, lumbosacral region: Secondary | ICD-10-CM | POA: Diagnosis not present

## 2013-12-24 DIAGNOSIS — I10 Essential (primary) hypertension: Secondary | ICD-10-CM | POA: Diagnosis not present

## 2013-12-29 DIAGNOSIS — S7000XA Contusion of unspecified hip, initial encounter: Secondary | ICD-10-CM | POA: Diagnosis not present

## 2014-01-04 DIAGNOSIS — Z9889 Other specified postprocedural states: Secondary | ICD-10-CM | POA: Diagnosis not present

## 2014-01-04 DIAGNOSIS — M5126 Other intervertebral disc displacement, lumbar region: Secondary | ICD-10-CM | POA: Diagnosis not present

## 2014-01-08 DIAGNOSIS — N183 Chronic kidney disease, stage 3 unspecified: Secondary | ICD-10-CM | POA: Diagnosis not present

## 2014-01-08 DIAGNOSIS — I10 Essential (primary) hypertension: Secondary | ICD-10-CM | POA: Diagnosis not present

## 2014-01-08 DIAGNOSIS — E119 Type 2 diabetes mellitus without complications: Secondary | ICD-10-CM | POA: Diagnosis not present

## 2014-01-08 DIAGNOSIS — E7800 Pure hypercholesterolemia, unspecified: Secondary | ICD-10-CM | POA: Diagnosis not present

## 2014-01-15 DIAGNOSIS — E119 Type 2 diabetes mellitus without complications: Secondary | ICD-10-CM | POA: Diagnosis not present

## 2014-02-10 DIAGNOSIS — Z23 Encounter for immunization: Secondary | ICD-10-CM | POA: Diagnosis not present

## 2014-02-11 DIAGNOSIS — M79674 Pain in right toe(s): Secondary | ICD-10-CM | POA: Diagnosis not present

## 2014-02-11 DIAGNOSIS — L600 Ingrowing nail: Secondary | ICD-10-CM | POA: Diagnosis not present

## 2014-02-12 DIAGNOSIS — Z1231 Encounter for screening mammogram for malignant neoplasm of breast: Secondary | ICD-10-CM | POA: Diagnosis not present

## 2014-03-18 DIAGNOSIS — Z6827 Body mass index (BMI) 27.0-27.9, adult: Secondary | ICD-10-CM | POA: Diagnosis not present

## 2014-03-18 DIAGNOSIS — I10 Essential (primary) hypertension: Secondary | ICD-10-CM | POA: Diagnosis not present

## 2014-03-18 DIAGNOSIS — M5416 Radiculopathy, lumbar region: Secondary | ICD-10-CM | POA: Diagnosis not present

## 2014-03-19 DIAGNOSIS — L570 Actinic keratosis: Secondary | ICD-10-CM | POA: Diagnosis not present

## 2014-04-12 DIAGNOSIS — H2513 Age-related nuclear cataract, bilateral: Secondary | ICD-10-CM | POA: Diagnosis not present

## 2014-04-13 DIAGNOSIS — D649 Anemia, unspecified: Secondary | ICD-10-CM | POA: Diagnosis not present

## 2014-04-13 DIAGNOSIS — R110 Nausea: Secondary | ICD-10-CM | POA: Diagnosis not present

## 2014-04-13 DIAGNOSIS — N3941 Urge incontinence: Secondary | ICD-10-CM | POA: Diagnosis not present

## 2014-04-20 DIAGNOSIS — L600 Ingrowing nail: Secondary | ICD-10-CM | POA: Diagnosis not present

## 2014-05-04 DIAGNOSIS — R110 Nausea: Secondary | ICD-10-CM | POA: Diagnosis not present

## 2014-05-04 DIAGNOSIS — N3941 Urge incontinence: Secondary | ICD-10-CM | POA: Diagnosis not present

## 2014-05-04 DIAGNOSIS — J209 Acute bronchitis, unspecified: Secondary | ICD-10-CM | POA: Diagnosis not present

## 2014-05-17 DIAGNOSIS — J189 Pneumonia, unspecified organism: Secondary | ICD-10-CM | POA: Diagnosis not present

## 2014-06-22 DIAGNOSIS — L600 Ingrowing nail: Secondary | ICD-10-CM | POA: Diagnosis not present

## 2014-07-21 DIAGNOSIS — L821 Other seborrheic keratosis: Secondary | ICD-10-CM | POA: Diagnosis not present

## 2014-07-21 DIAGNOSIS — L304 Erythema intertrigo: Secondary | ICD-10-CM | POA: Diagnosis not present

## 2014-07-21 DIAGNOSIS — L570 Actinic keratosis: Secondary | ICD-10-CM | POA: Diagnosis not present

## 2014-07-21 DIAGNOSIS — L578 Other skin changes due to chronic exposure to nonionizing radiation: Secondary | ICD-10-CM | POA: Diagnosis not present

## 2014-07-26 DIAGNOSIS — E780 Pure hypercholesterolemia: Secondary | ICD-10-CM | POA: Diagnosis not present

## 2014-07-26 DIAGNOSIS — E1140 Type 2 diabetes mellitus with diabetic neuropathy, unspecified: Secondary | ICD-10-CM | POA: Diagnosis not present

## 2014-07-26 DIAGNOSIS — E1121 Type 2 diabetes mellitus with diabetic nephropathy: Secondary | ICD-10-CM | POA: Diagnosis not present

## 2014-07-26 DIAGNOSIS — Z0000 Encounter for general adult medical examination without abnormal findings: Secondary | ICD-10-CM | POA: Diagnosis not present

## 2014-07-26 DIAGNOSIS — E119 Type 2 diabetes mellitus without complications: Secondary | ICD-10-CM | POA: Diagnosis not present

## 2014-07-26 DIAGNOSIS — E1169 Type 2 diabetes mellitus with other specified complication: Secondary | ICD-10-CM | POA: Diagnosis not present

## 2014-08-09 DIAGNOSIS — S93602A Unspecified sprain of left foot, initial encounter: Secondary | ICD-10-CM | POA: Diagnosis not present

## 2014-08-16 DIAGNOSIS — Z471 Aftercare following joint replacement surgery: Secondary | ICD-10-CM | POA: Diagnosis not present

## 2014-08-16 DIAGNOSIS — M79672 Pain in left foot: Secondary | ICD-10-CM | POA: Diagnosis not present

## 2014-08-16 DIAGNOSIS — Z96653 Presence of artificial knee joint, bilateral: Secondary | ICD-10-CM | POA: Diagnosis not present

## 2014-08-16 DIAGNOSIS — L03116 Cellulitis of left lower limb: Secondary | ICD-10-CM | POA: Diagnosis not present

## 2014-08-16 DIAGNOSIS — S92355A Nondisplaced fracture of fifth metatarsal bone, left foot, initial encounter for closed fracture: Secondary | ICD-10-CM | POA: Diagnosis not present

## 2014-08-24 DIAGNOSIS — L600 Ingrowing nail: Secondary | ICD-10-CM | POA: Diagnosis not present

## 2014-09-06 DIAGNOSIS — Z96653 Presence of artificial knee joint, bilateral: Secondary | ICD-10-CM | POA: Diagnosis not present

## 2014-09-06 DIAGNOSIS — S92309D Fracture of unspecified metatarsal bone(s), unspecified foot, subsequent encounter for fracture with routine healing: Secondary | ICD-10-CM | POA: Diagnosis not present

## 2014-10-23 DIAGNOSIS — S81811A Laceration without foreign body, right lower leg, initial encounter: Secondary | ICD-10-CM | POA: Diagnosis not present

## 2014-10-26 DIAGNOSIS — M722 Plantar fascial fibromatosis: Secondary | ICD-10-CM | POA: Diagnosis not present

## 2014-10-28 DIAGNOSIS — S81832S Puncture wound without foreign body, left lower leg, sequela: Secondary | ICD-10-CM | POA: Diagnosis not present

## 2014-10-28 DIAGNOSIS — R600 Localized edema: Secondary | ICD-10-CM | POA: Diagnosis not present

## 2014-10-28 DIAGNOSIS — I10 Essential (primary) hypertension: Secondary | ICD-10-CM | POA: Diagnosis not present

## 2014-10-28 DIAGNOSIS — E119 Type 2 diabetes mellitus without complications: Secondary | ICD-10-CM | POA: Diagnosis not present

## 2014-11-02 DIAGNOSIS — E119 Type 2 diabetes mellitus without complications: Secondary | ICD-10-CM | POA: Diagnosis not present

## 2014-11-02 DIAGNOSIS — I10 Essential (primary) hypertension: Secondary | ICD-10-CM | POA: Diagnosis not present

## 2014-11-02 DIAGNOSIS — R600 Localized edema: Secondary | ICD-10-CM | POA: Diagnosis not present

## 2014-11-02 DIAGNOSIS — S81819A Laceration without foreign body, unspecified lower leg, initial encounter: Secondary | ICD-10-CM | POA: Diagnosis not present

## 2014-12-29 DIAGNOSIS — M79675 Pain in left toe(s): Secondary | ICD-10-CM | POA: Diagnosis not present

## 2014-12-29 DIAGNOSIS — M79674 Pain in right toe(s): Secondary | ICD-10-CM | POA: Diagnosis not present

## 2014-12-29 DIAGNOSIS — B351 Tinea unguium: Secondary | ICD-10-CM | POA: Diagnosis not present

## 2015-01-10 DIAGNOSIS — R350 Frequency of micturition: Secondary | ICD-10-CM | POA: Diagnosis not present

## 2015-01-10 DIAGNOSIS — N3281 Overactive bladder: Secondary | ICD-10-CM | POA: Diagnosis not present

## 2015-01-20 DIAGNOSIS — Z23 Encounter for immunization: Secondary | ICD-10-CM | POA: Diagnosis not present

## 2015-02-07 DIAGNOSIS — E1169 Type 2 diabetes mellitus with other specified complication: Secondary | ICD-10-CM | POA: Diagnosis not present

## 2015-02-07 DIAGNOSIS — N183 Chronic kidney disease, stage 3 (moderate): Secondary | ICD-10-CM | POA: Diagnosis not present

## 2015-02-07 DIAGNOSIS — E119 Type 2 diabetes mellitus without complications: Secondary | ICD-10-CM | POA: Diagnosis not present

## 2015-02-07 DIAGNOSIS — Z79899 Other long term (current) drug therapy: Secondary | ICD-10-CM | POA: Diagnosis not present

## 2015-02-07 DIAGNOSIS — E780 Pure hypercholesterolemia: Secondary | ICD-10-CM | POA: Diagnosis not present

## 2015-02-07 DIAGNOSIS — I10 Essential (primary) hypertension: Secondary | ICD-10-CM | POA: Diagnosis not present

## 2015-02-15 DIAGNOSIS — Z1231 Encounter for screening mammogram for malignant neoplasm of breast: Secondary | ICD-10-CM | POA: Diagnosis not present

## 2015-02-21 DIAGNOSIS — M7989 Other specified soft tissue disorders: Secondary | ICD-10-CM | POA: Diagnosis not present

## 2015-02-21 DIAGNOSIS — M79672 Pain in left foot: Secondary | ICD-10-CM

## 2015-02-21 HISTORY — DX: Pain in left foot: M79.672

## 2015-02-23 DIAGNOSIS — M25572 Pain in left ankle and joints of left foot: Secondary | ICD-10-CM | POA: Diagnosis not present

## 2015-02-23 DIAGNOSIS — S92355D Nondisplaced fracture of fifth metatarsal bone, left foot, subsequent encounter for fracture with routine healing: Secondary | ICD-10-CM | POA: Diagnosis not present

## 2015-02-23 DIAGNOSIS — R2689 Other abnormalities of gait and mobility: Secondary | ICD-10-CM | POA: Diagnosis not present

## 2015-03-01 DIAGNOSIS — S92355D Nondisplaced fracture of fifth metatarsal bone, left foot, subsequent encounter for fracture with routine healing: Secondary | ICD-10-CM | POA: Diagnosis not present

## 2015-03-01 DIAGNOSIS — M25572 Pain in left ankle and joints of left foot: Secondary | ICD-10-CM | POA: Diagnosis not present

## 2015-03-01 DIAGNOSIS — R2689 Other abnormalities of gait and mobility: Secondary | ICD-10-CM | POA: Diagnosis not present

## 2015-03-03 DIAGNOSIS — M25572 Pain in left ankle and joints of left foot: Secondary | ICD-10-CM | POA: Diagnosis not present

## 2015-03-03 DIAGNOSIS — S92355D Nondisplaced fracture of fifth metatarsal bone, left foot, subsequent encounter for fracture with routine healing: Secondary | ICD-10-CM | POA: Diagnosis not present

## 2015-03-03 DIAGNOSIS — R2689 Other abnormalities of gait and mobility: Secondary | ICD-10-CM | POA: Diagnosis not present

## 2015-03-07 DIAGNOSIS — R2689 Other abnormalities of gait and mobility: Secondary | ICD-10-CM | POA: Diagnosis not present

## 2015-03-07 DIAGNOSIS — S92355D Nondisplaced fracture of fifth metatarsal bone, left foot, subsequent encounter for fracture with routine healing: Secondary | ICD-10-CM | POA: Diagnosis not present

## 2015-03-07 DIAGNOSIS — M25572 Pain in left ankle and joints of left foot: Secondary | ICD-10-CM | POA: Diagnosis not present

## 2015-03-09 DIAGNOSIS — L84 Corns and callosities: Secondary | ICD-10-CM | POA: Diagnosis not present

## 2015-03-09 DIAGNOSIS — L603 Nail dystrophy: Secondary | ICD-10-CM | POA: Diagnosis not present

## 2015-03-09 DIAGNOSIS — I739 Peripheral vascular disease, unspecified: Secondary | ICD-10-CM | POA: Diagnosis not present

## 2015-03-09 DIAGNOSIS — E1151 Type 2 diabetes mellitus with diabetic peripheral angiopathy without gangrene: Secondary | ICD-10-CM | POA: Diagnosis not present

## 2015-03-10 DIAGNOSIS — M25572 Pain in left ankle and joints of left foot: Secondary | ICD-10-CM | POA: Diagnosis not present

## 2015-03-10 DIAGNOSIS — R2689 Other abnormalities of gait and mobility: Secondary | ICD-10-CM | POA: Diagnosis not present

## 2015-03-10 DIAGNOSIS — S92355D Nondisplaced fracture of fifth metatarsal bone, left foot, subsequent encounter for fracture with routine healing: Secondary | ICD-10-CM | POA: Diagnosis not present

## 2015-03-15 DIAGNOSIS — R2689 Other abnormalities of gait and mobility: Secondary | ICD-10-CM | POA: Diagnosis not present

## 2015-03-15 DIAGNOSIS — M25572 Pain in left ankle and joints of left foot: Secondary | ICD-10-CM | POA: Diagnosis not present

## 2015-03-15 DIAGNOSIS — S92355D Nondisplaced fracture of fifth metatarsal bone, left foot, subsequent encounter for fracture with routine healing: Secondary | ICD-10-CM | POA: Diagnosis not present

## 2015-03-17 DIAGNOSIS — R2689 Other abnormalities of gait and mobility: Secondary | ICD-10-CM | POA: Diagnosis not present

## 2015-03-17 DIAGNOSIS — M25572 Pain in left ankle and joints of left foot: Secondary | ICD-10-CM | POA: Diagnosis not present

## 2015-03-17 DIAGNOSIS — S92355D Nondisplaced fracture of fifth metatarsal bone, left foot, subsequent encounter for fracture with routine healing: Secondary | ICD-10-CM | POA: Diagnosis not present

## 2015-03-22 DIAGNOSIS — M25572 Pain in left ankle and joints of left foot: Secondary | ICD-10-CM | POA: Diagnosis not present

## 2015-03-22 DIAGNOSIS — S92355D Nondisplaced fracture of fifth metatarsal bone, left foot, subsequent encounter for fracture with routine healing: Secondary | ICD-10-CM | POA: Diagnosis not present

## 2015-03-22 DIAGNOSIS — R2689 Other abnormalities of gait and mobility: Secondary | ICD-10-CM | POA: Diagnosis not present

## 2015-03-24 DIAGNOSIS — M25572 Pain in left ankle and joints of left foot: Secondary | ICD-10-CM | POA: Diagnosis not present

## 2015-03-24 DIAGNOSIS — S92355D Nondisplaced fracture of fifth metatarsal bone, left foot, subsequent encounter for fracture with routine healing: Secondary | ICD-10-CM | POA: Diagnosis not present

## 2015-03-24 DIAGNOSIS — R2689 Other abnormalities of gait and mobility: Secondary | ICD-10-CM | POA: Diagnosis not present

## 2015-03-28 DIAGNOSIS — R2689 Other abnormalities of gait and mobility: Secondary | ICD-10-CM | POA: Diagnosis not present

## 2015-03-28 DIAGNOSIS — S92355D Nondisplaced fracture of fifth metatarsal bone, left foot, subsequent encounter for fracture with routine healing: Secondary | ICD-10-CM | POA: Diagnosis not present

## 2015-03-28 DIAGNOSIS — M25572 Pain in left ankle and joints of left foot: Secondary | ICD-10-CM | POA: Diagnosis not present

## 2015-03-30 DIAGNOSIS — M25572 Pain in left ankle and joints of left foot: Secondary | ICD-10-CM | POA: Diagnosis not present

## 2015-03-30 DIAGNOSIS — S92355D Nondisplaced fracture of fifth metatarsal bone, left foot, subsequent encounter for fracture with routine healing: Secondary | ICD-10-CM | POA: Diagnosis not present

## 2015-03-30 DIAGNOSIS — R2689 Other abnormalities of gait and mobility: Secondary | ICD-10-CM | POA: Diagnosis not present

## 2015-04-04 DIAGNOSIS — S92355D Nondisplaced fracture of fifth metatarsal bone, left foot, subsequent encounter for fracture with routine healing: Secondary | ICD-10-CM | POA: Diagnosis not present

## 2015-04-04 DIAGNOSIS — M25572 Pain in left ankle and joints of left foot: Secondary | ICD-10-CM | POA: Diagnosis not present

## 2015-04-04 DIAGNOSIS — R3915 Urgency of urination: Secondary | ICD-10-CM | POA: Diagnosis not present

## 2015-04-04 DIAGNOSIS — G5603 Carpal tunnel syndrome, bilateral upper limbs: Secondary | ICD-10-CM | POA: Diagnosis not present

## 2015-04-04 DIAGNOSIS — R2689 Other abnormalities of gait and mobility: Secondary | ICD-10-CM | POA: Diagnosis not present

## 2015-04-04 DIAGNOSIS — R1310 Dysphagia, unspecified: Secondary | ICD-10-CM | POA: Diagnosis not present

## 2015-04-06 DIAGNOSIS — S92355D Nondisplaced fracture of fifth metatarsal bone, left foot, subsequent encounter for fracture with routine healing: Secondary | ICD-10-CM | POA: Diagnosis not present

## 2015-04-06 DIAGNOSIS — M25572 Pain in left ankle and joints of left foot: Secondary | ICD-10-CM | POA: Diagnosis not present

## 2015-04-06 DIAGNOSIS — R2689 Other abnormalities of gait and mobility: Secondary | ICD-10-CM | POA: Diagnosis not present

## 2015-04-11 DIAGNOSIS — S92355D Nondisplaced fracture of fifth metatarsal bone, left foot, subsequent encounter for fracture with routine healing: Secondary | ICD-10-CM | POA: Diagnosis not present

## 2015-04-11 DIAGNOSIS — R2689 Other abnormalities of gait and mobility: Secondary | ICD-10-CM | POA: Diagnosis not present

## 2015-04-11 DIAGNOSIS — M25572 Pain in left ankle and joints of left foot: Secondary | ICD-10-CM | POA: Diagnosis not present

## 2015-04-15 DIAGNOSIS — E119 Type 2 diabetes mellitus without complications: Secondary | ICD-10-CM | POA: Diagnosis not present

## 2015-04-18 DIAGNOSIS — R2689 Other abnormalities of gait and mobility: Secondary | ICD-10-CM | POA: Diagnosis not present

## 2015-04-18 DIAGNOSIS — S92355D Nondisplaced fracture of fifth metatarsal bone, left foot, subsequent encounter for fracture with routine healing: Secondary | ICD-10-CM | POA: Diagnosis not present

## 2015-04-18 DIAGNOSIS — M25572 Pain in left ankle and joints of left foot: Secondary | ICD-10-CM | POA: Diagnosis not present

## 2015-04-20 DIAGNOSIS — J019 Acute sinusitis, unspecified: Secondary | ICD-10-CM | POA: Diagnosis not present

## 2015-04-22 DIAGNOSIS — L578 Other skin changes due to chronic exposure to nonionizing radiation: Secondary | ICD-10-CM | POA: Diagnosis not present

## 2015-04-22 DIAGNOSIS — L570 Actinic keratosis: Secondary | ICD-10-CM | POA: Diagnosis not present

## 2015-04-24 DIAGNOSIS — J0100 Acute maxillary sinusitis, unspecified: Secondary | ICD-10-CM | POA: Diagnosis not present

## 2015-05-23 DIAGNOSIS — E1151 Type 2 diabetes mellitus with diabetic peripheral angiopathy without gangrene: Secondary | ICD-10-CM | POA: Diagnosis not present

## 2015-05-23 DIAGNOSIS — L603 Nail dystrophy: Secondary | ICD-10-CM | POA: Diagnosis not present

## 2015-05-23 DIAGNOSIS — L84 Corns and callosities: Secondary | ICD-10-CM | POA: Diagnosis not present

## 2015-05-23 DIAGNOSIS — I739 Peripheral vascular disease, unspecified: Secondary | ICD-10-CM | POA: Diagnosis not present

## 2015-06-06 DIAGNOSIS — M545 Low back pain: Secondary | ICD-10-CM | POA: Diagnosis not present

## 2015-06-06 DIAGNOSIS — S300XXA Contusion of lower back and pelvis, initial encounter: Secondary | ICD-10-CM | POA: Diagnosis not present

## 2015-06-06 DIAGNOSIS — S7001XA Contusion of right hip, initial encounter: Secondary | ICD-10-CM | POA: Diagnosis not present

## 2015-06-21 DIAGNOSIS — E119 Type 2 diabetes mellitus without complications: Secondary | ICD-10-CM | POA: Diagnosis not present

## 2015-06-21 DIAGNOSIS — R05 Cough: Secondary | ICD-10-CM | POA: Diagnosis not present

## 2015-06-21 DIAGNOSIS — E7800 Pure hypercholesterolemia, unspecified: Secondary | ICD-10-CM | POA: Diagnosis not present

## 2015-06-21 DIAGNOSIS — I10 Essential (primary) hypertension: Secondary | ICD-10-CM | POA: Diagnosis not present

## 2015-07-20 DIAGNOSIS — L570 Actinic keratosis: Secondary | ICD-10-CM | POA: Diagnosis not present

## 2015-08-01 DIAGNOSIS — L84 Corns and callosities: Secondary | ICD-10-CM | POA: Diagnosis not present

## 2015-08-01 DIAGNOSIS — E1151 Type 2 diabetes mellitus with diabetic peripheral angiopathy without gangrene: Secondary | ICD-10-CM | POA: Diagnosis not present

## 2015-08-01 DIAGNOSIS — L603 Nail dystrophy: Secondary | ICD-10-CM | POA: Diagnosis not present

## 2015-08-01 DIAGNOSIS — I739 Peripheral vascular disease, unspecified: Secondary | ICD-10-CM | POA: Diagnosis not present

## 2015-09-06 DIAGNOSIS — R0982 Postnasal drip: Secondary | ICD-10-CM | POA: Diagnosis not present

## 2015-09-06 DIAGNOSIS — J309 Allergic rhinitis, unspecified: Secondary | ICD-10-CM | POA: Diagnosis not present

## 2015-09-06 DIAGNOSIS — J329 Chronic sinusitis, unspecified: Secondary | ICD-10-CM | POA: Diagnosis not present

## 2015-09-06 DIAGNOSIS — N3281 Overactive bladder: Secondary | ICD-10-CM | POA: Diagnosis not present

## 2015-10-04 DIAGNOSIS — E1151 Type 2 diabetes mellitus with diabetic peripheral angiopathy without gangrene: Secondary | ICD-10-CM | POA: Diagnosis not present

## 2015-10-04 DIAGNOSIS — L603 Nail dystrophy: Secondary | ICD-10-CM | POA: Diagnosis not present

## 2015-10-04 DIAGNOSIS — L84 Corns and callosities: Secondary | ICD-10-CM | POA: Diagnosis not present

## 2015-10-04 DIAGNOSIS — I739 Peripheral vascular disease, unspecified: Secondary | ICD-10-CM | POA: Diagnosis not present

## 2015-11-04 DIAGNOSIS — Y92099 Unspecified place in other non-institutional residence as the place of occurrence of the external cause: Secondary | ICD-10-CM | POA: Diagnosis not present

## 2015-11-04 DIAGNOSIS — W19XXXA Unspecified fall, initial encounter: Secondary | ICD-10-CM | POA: Diagnosis not present

## 2015-11-04 DIAGNOSIS — M25551 Pain in right hip: Secondary | ICD-10-CM | POA: Diagnosis not present

## 2015-12-22 DIAGNOSIS — I739 Peripheral vascular disease, unspecified: Secondary | ICD-10-CM | POA: Diagnosis not present

## 2015-12-22 DIAGNOSIS — L603 Nail dystrophy: Secondary | ICD-10-CM | POA: Diagnosis not present

## 2015-12-22 DIAGNOSIS — E1151 Type 2 diabetes mellitus with diabetic peripheral angiopathy without gangrene: Secondary | ICD-10-CM | POA: Diagnosis not present

## 2015-12-22 DIAGNOSIS — L84 Corns and callosities: Secondary | ICD-10-CM | POA: Diagnosis not present

## 2016-01-17 DIAGNOSIS — M79641 Pain in right hand: Secondary | ICD-10-CM | POA: Diagnosis not present

## 2016-01-17 DIAGNOSIS — M79642 Pain in left hand: Secondary | ICD-10-CM

## 2016-01-17 HISTORY — DX: Pain in right hand: M79.641

## 2016-01-17 HISTORY — DX: Pain in right hand: M79.642

## 2016-01-23 DIAGNOSIS — L570 Actinic keratosis: Secondary | ICD-10-CM | POA: Diagnosis not present

## 2016-01-23 DIAGNOSIS — L821 Other seborrheic keratosis: Secondary | ICD-10-CM | POA: Diagnosis not present

## 2016-01-23 DIAGNOSIS — L578 Other skin changes due to chronic exposure to nonionizing radiation: Secondary | ICD-10-CM | POA: Diagnosis not present

## 2016-01-24 DIAGNOSIS — N3289 Other specified disorders of bladder: Secondary | ICD-10-CM | POA: Diagnosis not present

## 2016-01-24 DIAGNOSIS — N952 Postmenopausal atrophic vaginitis: Secondary | ICD-10-CM | POA: Diagnosis not present

## 2016-02-08 DIAGNOSIS — M71342 Other bursal cyst, left hand: Secondary | ICD-10-CM | POA: Diagnosis not present

## 2016-02-08 DIAGNOSIS — M71341 Other bursal cyst, right hand: Secondary | ICD-10-CM | POA: Diagnosis not present

## 2016-02-13 DIAGNOSIS — Z23 Encounter for immunization: Secondary | ICD-10-CM | POA: Diagnosis not present

## 2016-02-23 DIAGNOSIS — I739 Peripheral vascular disease, unspecified: Secondary | ICD-10-CM | POA: Diagnosis not present

## 2016-02-23 DIAGNOSIS — L603 Nail dystrophy: Secondary | ICD-10-CM | POA: Diagnosis not present

## 2016-02-23 DIAGNOSIS — E1151 Type 2 diabetes mellitus with diabetic peripheral angiopathy without gangrene: Secondary | ICD-10-CM | POA: Diagnosis not present

## 2016-02-23 DIAGNOSIS — L84 Corns and callosities: Secondary | ICD-10-CM | POA: Diagnosis not present

## 2016-04-13 DIAGNOSIS — E119 Type 2 diabetes mellitus without complications: Secondary | ICD-10-CM | POA: Diagnosis not present

## 2016-04-17 DIAGNOSIS — Z0000 Encounter for general adult medical examination without abnormal findings: Secondary | ICD-10-CM | POA: Diagnosis not present

## 2016-04-17 DIAGNOSIS — E119 Type 2 diabetes mellitus without complications: Secondary | ICD-10-CM | POA: Diagnosis not present

## 2016-04-17 DIAGNOSIS — G609 Hereditary and idiopathic neuropathy, unspecified: Secondary | ICD-10-CM | POA: Diagnosis not present

## 2016-04-17 DIAGNOSIS — E785 Hyperlipidemia, unspecified: Secondary | ICD-10-CM | POA: Diagnosis not present

## 2016-04-17 DIAGNOSIS — Z79899 Other long term (current) drug therapy: Secondary | ICD-10-CM | POA: Diagnosis not present

## 2016-04-17 DIAGNOSIS — N3281 Overactive bladder: Secondary | ICD-10-CM | POA: Diagnosis not present

## 2016-04-24 DIAGNOSIS — L570 Actinic keratosis: Secondary | ICD-10-CM | POA: Diagnosis not present

## 2016-05-03 DIAGNOSIS — I739 Peripheral vascular disease, unspecified: Secondary | ICD-10-CM | POA: Diagnosis not present

## 2016-05-03 DIAGNOSIS — E1151 Type 2 diabetes mellitus with diabetic peripheral angiopathy without gangrene: Secondary | ICD-10-CM | POA: Diagnosis not present

## 2016-05-03 DIAGNOSIS — L603 Nail dystrophy: Secondary | ICD-10-CM | POA: Diagnosis not present

## 2016-05-03 DIAGNOSIS — L84 Corns and callosities: Secondary | ICD-10-CM | POA: Diagnosis not present

## 2016-06-07 DIAGNOSIS — W19XXXA Unspecified fall, initial encounter: Secondary | ICD-10-CM | POA: Diagnosis not present

## 2016-06-07 DIAGNOSIS — S41111A Laceration without foreign body of right upper arm, initial encounter: Secondary | ICD-10-CM | POA: Diagnosis not present

## 2016-06-07 DIAGNOSIS — N3281 Overactive bladder: Secondary | ICD-10-CM | POA: Diagnosis not present

## 2016-06-07 DIAGNOSIS — Y92099 Unspecified place in other non-institutional residence as the place of occurrence of the external cause: Secondary | ICD-10-CM | POA: Diagnosis not present

## 2016-06-15 DIAGNOSIS — S80211A Abrasion, right knee, initial encounter: Secondary | ICD-10-CM | POA: Diagnosis not present

## 2016-06-15 DIAGNOSIS — W19XXXA Unspecified fall, initial encounter: Secondary | ICD-10-CM | POA: Diagnosis not present

## 2016-06-29 DIAGNOSIS — J111 Influenza due to unidentified influenza virus with other respiratory manifestations: Secondary | ICD-10-CM | POA: Diagnosis not present

## 2016-07-02 DIAGNOSIS — J22 Unspecified acute lower respiratory infection: Secondary | ICD-10-CM | POA: Diagnosis not present

## 2016-07-12 DIAGNOSIS — E1151 Type 2 diabetes mellitus with diabetic peripheral angiopathy without gangrene: Secondary | ICD-10-CM | POA: Diagnosis not present

## 2016-07-12 DIAGNOSIS — I739 Peripheral vascular disease, unspecified: Secondary | ICD-10-CM | POA: Diagnosis not present

## 2016-07-12 DIAGNOSIS — L603 Nail dystrophy: Secondary | ICD-10-CM | POA: Diagnosis not present

## 2016-07-12 DIAGNOSIS — L84 Corns and callosities: Secondary | ICD-10-CM | POA: Diagnosis not present

## 2016-09-28 DIAGNOSIS — Z6828 Body mass index (BMI) 28.0-28.9, adult: Secondary | ICD-10-CM | POA: Diagnosis not present

## 2016-09-28 DIAGNOSIS — K529 Noninfective gastroenteritis and colitis, unspecified: Secondary | ICD-10-CM | POA: Diagnosis not present

## 2016-09-28 DIAGNOSIS — Z1389 Encounter for screening for other disorder: Secondary | ICD-10-CM | POA: Diagnosis not present

## 2016-10-01 DIAGNOSIS — A020 Salmonella enteritis: Secondary | ICD-10-CM | POA: Diagnosis not present

## 2016-10-01 DIAGNOSIS — E860 Dehydration: Secondary | ICD-10-CM | POA: Diagnosis not present

## 2016-10-01 DIAGNOSIS — Z6829 Body mass index (BMI) 29.0-29.9, adult: Secondary | ICD-10-CM | POA: Diagnosis not present

## 2016-10-18 DIAGNOSIS — L84 Corns and callosities: Secondary | ICD-10-CM | POA: Diagnosis not present

## 2016-10-18 DIAGNOSIS — I739 Peripheral vascular disease, unspecified: Secondary | ICD-10-CM | POA: Diagnosis not present

## 2016-10-18 DIAGNOSIS — L603 Nail dystrophy: Secondary | ICD-10-CM | POA: Diagnosis not present

## 2016-10-18 DIAGNOSIS — E1151 Type 2 diabetes mellitus with diabetic peripheral angiopathy without gangrene: Secondary | ICD-10-CM | POA: Diagnosis not present

## 2016-10-20 DIAGNOSIS — S81842A Puncture wound with foreign body, left lower leg, initial encounter: Secondary | ICD-10-CM | POA: Diagnosis not present

## 2016-10-20 DIAGNOSIS — M75102 Unspecified rotator cuff tear or rupture of left shoulder, not specified as traumatic: Secondary | ICD-10-CM | POA: Diagnosis not present

## 2016-10-23 DIAGNOSIS — S81832D Puncture wound without foreign body, left lower leg, subsequent encounter: Secondary | ICD-10-CM | POA: Diagnosis not present

## 2016-10-23 DIAGNOSIS — L578 Other skin changes due to chronic exposure to nonionizing radiation: Secondary | ICD-10-CM | POA: Diagnosis not present

## 2016-10-23 DIAGNOSIS — L570 Actinic keratosis: Secondary | ICD-10-CM | POA: Diagnosis not present

## 2016-10-23 DIAGNOSIS — Z6829 Body mass index (BMI) 29.0-29.9, adult: Secondary | ICD-10-CM | POA: Diagnosis not present

## 2016-10-23 DIAGNOSIS — M67912 Unspecified disorder of synovium and tendon, left shoulder: Secondary | ICD-10-CM | POA: Diagnosis not present

## 2016-11-14 DIAGNOSIS — S0083XA Contusion of other part of head, initial encounter: Secondary | ICD-10-CM | POA: Diagnosis not present

## 2016-11-14 DIAGNOSIS — W0110XA Fall on same level from slipping, tripping and stumbling with subsequent striking against unspecified object, initial encounter: Secondary | ICD-10-CM | POA: Diagnosis not present

## 2016-11-14 DIAGNOSIS — S01112A Laceration without foreign body of left eyelid and periocular area, initial encounter: Secondary | ICD-10-CM | POA: Diagnosis not present

## 2016-11-14 DIAGNOSIS — S0990XA Unspecified injury of head, initial encounter: Secondary | ICD-10-CM | POA: Diagnosis not present

## 2016-11-14 DIAGNOSIS — S0181XA Laceration without foreign body of other part of head, initial encounter: Secondary | ICD-10-CM | POA: Diagnosis not present

## 2016-12-20 DIAGNOSIS — E1151 Type 2 diabetes mellitus with diabetic peripheral angiopathy without gangrene: Secondary | ICD-10-CM | POA: Diagnosis not present

## 2016-12-20 DIAGNOSIS — L603 Nail dystrophy: Secondary | ICD-10-CM | POA: Diagnosis not present

## 2016-12-20 DIAGNOSIS — L84 Corns and callosities: Secondary | ICD-10-CM | POA: Diagnosis not present

## 2016-12-20 DIAGNOSIS — I739 Peripheral vascular disease, unspecified: Secondary | ICD-10-CM | POA: Diagnosis not present

## 2017-02-21 DIAGNOSIS — E785 Hyperlipidemia, unspecified: Secondary | ICD-10-CM | POA: Diagnosis not present

## 2017-02-21 DIAGNOSIS — E1169 Type 2 diabetes mellitus with other specified complication: Secondary | ICD-10-CM | POA: Diagnosis not present

## 2017-02-21 DIAGNOSIS — Z23 Encounter for immunization: Secondary | ICD-10-CM | POA: Diagnosis not present

## 2017-02-21 DIAGNOSIS — M542 Cervicalgia: Secondary | ICD-10-CM | POA: Diagnosis not present

## 2017-02-21 DIAGNOSIS — Z79899 Other long term (current) drug therapy: Secondary | ICD-10-CM | POA: Diagnosis not present

## 2017-02-21 DIAGNOSIS — H539 Unspecified visual disturbance: Secondary | ICD-10-CM | POA: Diagnosis not present

## 2017-02-27 DIAGNOSIS — G459 Transient cerebral ischemic attack, unspecified: Secondary | ICD-10-CM | POA: Diagnosis not present

## 2017-02-27 DIAGNOSIS — I6523 Occlusion and stenosis of bilateral carotid arteries: Secondary | ICD-10-CM | POA: Diagnosis not present

## 2017-02-28 DIAGNOSIS — L603 Nail dystrophy: Secondary | ICD-10-CM | POA: Diagnosis not present

## 2017-02-28 DIAGNOSIS — L84 Corns and callosities: Secondary | ICD-10-CM | POA: Diagnosis not present

## 2017-02-28 DIAGNOSIS — I739 Peripheral vascular disease, unspecified: Secondary | ICD-10-CM | POA: Diagnosis not present

## 2017-02-28 DIAGNOSIS — E1151 Type 2 diabetes mellitus with diabetic peripheral angiopathy without gangrene: Secondary | ICD-10-CM | POA: Diagnosis not present

## 2017-04-19 DIAGNOSIS — E119 Type 2 diabetes mellitus without complications: Secondary | ICD-10-CM | POA: Diagnosis not present

## 2017-04-23 DIAGNOSIS — L578 Other skin changes due to chronic exposure to nonionizing radiation: Secondary | ICD-10-CM | POA: Diagnosis not present

## 2017-04-23 DIAGNOSIS — L821 Other seborrheic keratosis: Secondary | ICD-10-CM | POA: Diagnosis not present

## 2017-04-23 DIAGNOSIS — L570 Actinic keratosis: Secondary | ICD-10-CM | POA: Diagnosis not present

## 2017-05-03 DIAGNOSIS — L603 Nail dystrophy: Secondary | ICD-10-CM | POA: Diagnosis not present

## 2017-05-03 DIAGNOSIS — L84 Corns and callosities: Secondary | ICD-10-CM | POA: Diagnosis not present

## 2017-05-03 DIAGNOSIS — I739 Peripheral vascular disease, unspecified: Secondary | ICD-10-CM | POA: Diagnosis not present

## 2017-05-03 DIAGNOSIS — E1151 Type 2 diabetes mellitus with diabetic peripheral angiopathy without gangrene: Secondary | ICD-10-CM | POA: Diagnosis not present

## 2017-05-17 DIAGNOSIS — H26492 Other secondary cataract, left eye: Secondary | ICD-10-CM | POA: Diagnosis not present

## 2017-06-16 DIAGNOSIS — S0502XA Injury of conjunctiva and corneal abrasion without foreign body, left eye, initial encounter: Secondary | ICD-10-CM | POA: Diagnosis not present

## 2017-07-04 DIAGNOSIS — K5909 Other constipation: Secondary | ICD-10-CM | POA: Diagnosis not present

## 2017-07-04 DIAGNOSIS — N3281 Overactive bladder: Secondary | ICD-10-CM | POA: Diagnosis not present

## 2017-07-04 DIAGNOSIS — Z79899 Other long term (current) drug therapy: Secondary | ICD-10-CM | POA: Diagnosis not present

## 2017-07-04 DIAGNOSIS — N952 Postmenopausal atrophic vaginitis: Secondary | ICD-10-CM | POA: Diagnosis not present

## 2017-07-04 DIAGNOSIS — R351 Nocturia: Secondary | ICD-10-CM | POA: Diagnosis not present

## 2017-07-08 DIAGNOSIS — I739 Peripheral vascular disease, unspecified: Secondary | ICD-10-CM | POA: Diagnosis not present

## 2017-07-08 DIAGNOSIS — E1151 Type 2 diabetes mellitus with diabetic peripheral angiopathy without gangrene: Secondary | ICD-10-CM | POA: Diagnosis not present

## 2017-07-08 DIAGNOSIS — L603 Nail dystrophy: Secondary | ICD-10-CM | POA: Diagnosis not present

## 2017-07-08 DIAGNOSIS — L84 Corns and callosities: Secondary | ICD-10-CM | POA: Diagnosis not present

## 2017-07-26 DIAGNOSIS — H26491 Other secondary cataract, right eye: Secondary | ICD-10-CM | POA: Diagnosis not present

## 2017-08-01 DIAGNOSIS — N3289 Other specified disorders of bladder: Secondary | ICD-10-CM | POA: Diagnosis not present

## 2017-08-01 DIAGNOSIS — N952 Postmenopausal atrophic vaginitis: Secondary | ICD-10-CM | POA: Diagnosis not present

## 2017-09-04 DIAGNOSIS — Z0000 Encounter for general adult medical examination without abnormal findings: Secondary | ICD-10-CM | POA: Diagnosis not present

## 2017-09-04 DIAGNOSIS — E1169 Type 2 diabetes mellitus with other specified complication: Secondary | ICD-10-CM | POA: Diagnosis not present

## 2017-09-04 DIAGNOSIS — E785 Hyperlipidemia, unspecified: Secondary | ICD-10-CM | POA: Diagnosis not present

## 2017-09-04 DIAGNOSIS — D649 Anemia, unspecified: Secondary | ICD-10-CM | POA: Diagnosis not present

## 2017-09-04 DIAGNOSIS — I10 Essential (primary) hypertension: Secondary | ICD-10-CM | POA: Diagnosis not present

## 2017-09-04 DIAGNOSIS — G609 Hereditary and idiopathic neuropathy, unspecified: Secondary | ICD-10-CM | POA: Diagnosis not present

## 2017-09-04 DIAGNOSIS — K219 Gastro-esophageal reflux disease without esophagitis: Secondary | ICD-10-CM | POA: Diagnosis not present

## 2017-09-04 DIAGNOSIS — Z79899 Other long term (current) drug therapy: Secondary | ICD-10-CM | POA: Diagnosis not present

## 2017-09-09 DIAGNOSIS — L84 Corns and callosities: Secondary | ICD-10-CM | POA: Diagnosis not present

## 2017-09-09 DIAGNOSIS — L603 Nail dystrophy: Secondary | ICD-10-CM | POA: Diagnosis not present

## 2017-09-09 DIAGNOSIS — I739 Peripheral vascular disease, unspecified: Secondary | ICD-10-CM | POA: Diagnosis not present

## 2017-09-09 DIAGNOSIS — E1151 Type 2 diabetes mellitus with diabetic peripheral angiopathy without gangrene: Secondary | ICD-10-CM | POA: Diagnosis not present

## 2017-10-09 DIAGNOSIS — B9689 Other specified bacterial agents as the cause of diseases classified elsewhere: Secondary | ICD-10-CM | POA: Diagnosis not present

## 2017-10-09 DIAGNOSIS — J208 Acute bronchitis due to other specified organisms: Secondary | ICD-10-CM | POA: Diagnosis not present

## 2017-10-09 DIAGNOSIS — Z6829 Body mass index (BMI) 29.0-29.9, adult: Secondary | ICD-10-CM | POA: Diagnosis not present

## 2017-10-22 DIAGNOSIS — L578 Other skin changes due to chronic exposure to nonionizing radiation: Secondary | ICD-10-CM | POA: Diagnosis not present

## 2017-10-22 DIAGNOSIS — L570 Actinic keratosis: Secondary | ICD-10-CM | POA: Diagnosis not present

## 2017-10-22 DIAGNOSIS — L821 Other seborrheic keratosis: Secondary | ICD-10-CM | POA: Diagnosis not present

## 2017-11-07 DIAGNOSIS — N3289 Other specified disorders of bladder: Secondary | ICD-10-CM | POA: Diagnosis not present

## 2017-11-11 DIAGNOSIS — I739 Peripheral vascular disease, unspecified: Secondary | ICD-10-CM | POA: Diagnosis not present

## 2017-11-11 DIAGNOSIS — L84 Corns and callosities: Secondary | ICD-10-CM | POA: Diagnosis not present

## 2017-11-11 DIAGNOSIS — E1151 Type 2 diabetes mellitus with diabetic peripheral angiopathy without gangrene: Secondary | ICD-10-CM | POA: Diagnosis not present

## 2017-11-11 DIAGNOSIS — L603 Nail dystrophy: Secondary | ICD-10-CM | POA: Diagnosis not present

## 2017-11-29 DIAGNOSIS — Z23 Encounter for immunization: Secondary | ICD-10-CM | POA: Diagnosis not present

## 2017-12-02 DIAGNOSIS — Z6828 Body mass index (BMI) 28.0-28.9, adult: Secondary | ICD-10-CM | POA: Diagnosis not present

## 2017-12-02 DIAGNOSIS — R1310 Dysphagia, unspecified: Secondary | ICD-10-CM | POA: Diagnosis not present

## 2017-12-02 DIAGNOSIS — J40 Bronchitis, not specified as acute or chronic: Secondary | ICD-10-CM | POA: Diagnosis not present

## 2017-12-02 DIAGNOSIS — J040 Acute laryngitis: Secondary | ICD-10-CM | POA: Diagnosis not present

## 2017-12-02 DIAGNOSIS — N952 Postmenopausal atrophic vaginitis: Secondary | ICD-10-CM | POA: Diagnosis not present

## 2017-12-02 DIAGNOSIS — N3281 Overactive bladder: Secondary | ICD-10-CM | POA: Diagnosis not present

## 2017-12-02 DIAGNOSIS — N390 Urinary tract infection, site not specified: Secondary | ICD-10-CM | POA: Diagnosis not present

## 2018-01-07 DIAGNOSIS — S0500XA Injury of conjunctiva and corneal abrasion without foreign body, unspecified eye, initial encounter: Secondary | ICD-10-CM | POA: Diagnosis not present

## 2018-01-27 DIAGNOSIS — Z79899 Other long term (current) drug therapy: Secondary | ICD-10-CM | POA: Diagnosis not present

## 2018-01-27 DIAGNOSIS — M25551 Pain in right hip: Secondary | ICD-10-CM | POA: Diagnosis not present

## 2018-01-27 DIAGNOSIS — S335XXA Sprain of ligaments of lumbar spine, initial encounter: Secondary | ICD-10-CM | POA: Diagnosis not present

## 2018-01-27 DIAGNOSIS — M545 Low back pain: Secondary | ICD-10-CM | POA: Diagnosis not present

## 2018-01-27 DIAGNOSIS — M549 Dorsalgia, unspecified: Secondary | ICD-10-CM | POA: Diagnosis not present

## 2018-01-27 DIAGNOSIS — E119 Type 2 diabetes mellitus without complications: Secondary | ICD-10-CM | POA: Diagnosis not present

## 2018-01-27 DIAGNOSIS — I10 Essential (primary) hypertension: Secondary | ICD-10-CM | POA: Diagnosis not present

## 2018-02-04 DIAGNOSIS — Z23 Encounter for immunization: Secondary | ICD-10-CM | POA: Diagnosis not present

## 2018-02-05 DIAGNOSIS — Z9181 History of falling: Secondary | ICD-10-CM | POA: Diagnosis not present

## 2018-02-05 DIAGNOSIS — S39012A Strain of muscle, fascia and tendon of lower back, initial encounter: Secondary | ICD-10-CM | POA: Diagnosis not present

## 2018-02-05 DIAGNOSIS — Z1331 Encounter for screening for depression: Secondary | ICD-10-CM | POA: Diagnosis not present

## 2018-02-05 DIAGNOSIS — Z6827 Body mass index (BMI) 27.0-27.9, adult: Secondary | ICD-10-CM | POA: Diagnosis not present

## 2018-02-10 DIAGNOSIS — I739 Peripheral vascular disease, unspecified: Secondary | ICD-10-CM | POA: Diagnosis not present

## 2018-02-10 DIAGNOSIS — E1151 Type 2 diabetes mellitus with diabetic peripheral angiopathy without gangrene: Secondary | ICD-10-CM | POA: Diagnosis not present

## 2018-02-10 DIAGNOSIS — L603 Nail dystrophy: Secondary | ICD-10-CM | POA: Diagnosis not present

## 2018-02-12 DIAGNOSIS — R2681 Unsteadiness on feet: Secondary | ICD-10-CM | POA: Diagnosis not present

## 2018-02-12 DIAGNOSIS — R293 Abnormal posture: Secondary | ICD-10-CM | POA: Diagnosis not present

## 2018-02-12 DIAGNOSIS — M2560 Stiffness of unspecified joint, not elsewhere classified: Secondary | ICD-10-CM | POA: Diagnosis not present

## 2018-02-12 DIAGNOSIS — S39012S Strain of muscle, fascia and tendon of lower back, sequela: Secondary | ICD-10-CM | POA: Diagnosis not present

## 2018-02-12 DIAGNOSIS — M6281 Muscle weakness (generalized): Secondary | ICD-10-CM | POA: Diagnosis not present

## 2018-02-17 DIAGNOSIS — S39012S Strain of muscle, fascia and tendon of lower back, sequela: Secondary | ICD-10-CM | POA: Diagnosis not present

## 2018-02-17 DIAGNOSIS — E1169 Type 2 diabetes mellitus with other specified complication: Secondary | ICD-10-CM | POA: Diagnosis not present

## 2018-02-17 DIAGNOSIS — R2681 Unsteadiness on feet: Secondary | ICD-10-CM | POA: Diagnosis not present

## 2018-02-17 DIAGNOSIS — M6281 Muscle weakness (generalized): Secondary | ICD-10-CM | POA: Diagnosis not present

## 2018-02-17 DIAGNOSIS — M2560 Stiffness of unspecified joint, not elsewhere classified: Secondary | ICD-10-CM | POA: Diagnosis not present

## 2018-02-17 DIAGNOSIS — R293 Abnormal posture: Secondary | ICD-10-CM | POA: Diagnosis not present

## 2018-02-18 DIAGNOSIS — G609 Hereditary and idiopathic neuropathy, unspecified: Secondary | ICD-10-CM | POA: Diagnosis not present

## 2018-02-18 DIAGNOSIS — E785 Hyperlipidemia, unspecified: Secondary | ICD-10-CM | POA: Diagnosis not present

## 2018-02-18 DIAGNOSIS — E1169 Type 2 diabetes mellitus with other specified complication: Secondary | ICD-10-CM | POA: Diagnosis not present

## 2018-02-18 DIAGNOSIS — N183 Chronic kidney disease, stage 3 (moderate): Secondary | ICD-10-CM | POA: Diagnosis not present

## 2018-02-19 DIAGNOSIS — M6281 Muscle weakness (generalized): Secondary | ICD-10-CM | POA: Diagnosis not present

## 2018-02-19 DIAGNOSIS — S39012S Strain of muscle, fascia and tendon of lower back, sequela: Secondary | ICD-10-CM | POA: Diagnosis not present

## 2018-02-19 DIAGNOSIS — R293 Abnormal posture: Secondary | ICD-10-CM | POA: Diagnosis not present

## 2018-02-19 DIAGNOSIS — R2681 Unsteadiness on feet: Secondary | ICD-10-CM | POA: Diagnosis not present

## 2018-02-19 DIAGNOSIS — M2560 Stiffness of unspecified joint, not elsewhere classified: Secondary | ICD-10-CM | POA: Diagnosis not present

## 2018-02-24 DIAGNOSIS — S39012S Strain of muscle, fascia and tendon of lower back, sequela: Secondary | ICD-10-CM | POA: Diagnosis not present

## 2018-02-24 DIAGNOSIS — R2681 Unsteadiness on feet: Secondary | ICD-10-CM | POA: Diagnosis not present

## 2018-02-24 DIAGNOSIS — M2560 Stiffness of unspecified joint, not elsewhere classified: Secondary | ICD-10-CM | POA: Diagnosis not present

## 2018-02-24 DIAGNOSIS — M6281 Muscle weakness (generalized): Secondary | ICD-10-CM | POA: Diagnosis not present

## 2018-02-24 DIAGNOSIS — R293 Abnormal posture: Secondary | ICD-10-CM | POA: Diagnosis not present

## 2018-02-26 DIAGNOSIS — M2560 Stiffness of unspecified joint, not elsewhere classified: Secondary | ICD-10-CM | POA: Diagnosis not present

## 2018-02-26 DIAGNOSIS — M6281 Muscle weakness (generalized): Secondary | ICD-10-CM | POA: Diagnosis not present

## 2018-02-26 DIAGNOSIS — S39012S Strain of muscle, fascia and tendon of lower back, sequela: Secondary | ICD-10-CM | POA: Diagnosis not present

## 2018-02-26 DIAGNOSIS — R2681 Unsteadiness on feet: Secondary | ICD-10-CM | POA: Diagnosis not present

## 2018-02-26 DIAGNOSIS — R293 Abnormal posture: Secondary | ICD-10-CM | POA: Diagnosis not present

## 2018-03-04 DIAGNOSIS — R293 Abnormal posture: Secondary | ICD-10-CM | POA: Diagnosis not present

## 2018-03-04 DIAGNOSIS — M2560 Stiffness of unspecified joint, not elsewhere classified: Secondary | ICD-10-CM | POA: Diagnosis not present

## 2018-03-04 DIAGNOSIS — M6281 Muscle weakness (generalized): Secondary | ICD-10-CM | POA: Diagnosis not present

## 2018-03-04 DIAGNOSIS — S39012S Strain of muscle, fascia and tendon of lower back, sequela: Secondary | ICD-10-CM | POA: Diagnosis not present

## 2018-03-04 DIAGNOSIS — R2681 Unsteadiness on feet: Secondary | ICD-10-CM | POA: Diagnosis not present

## 2018-03-05 DIAGNOSIS — N3281 Overactive bladder: Secondary | ICD-10-CM | POA: Diagnosis not present

## 2018-03-05 DIAGNOSIS — N952 Postmenopausal atrophic vaginitis: Secondary | ICD-10-CM | POA: Diagnosis not present

## 2018-03-07 DIAGNOSIS — S39012S Strain of muscle, fascia and tendon of lower back, sequela: Secondary | ICD-10-CM | POA: Diagnosis not present

## 2018-03-07 DIAGNOSIS — R293 Abnormal posture: Secondary | ICD-10-CM | POA: Diagnosis not present

## 2018-03-07 DIAGNOSIS — R2681 Unsteadiness on feet: Secondary | ICD-10-CM | POA: Diagnosis not present

## 2018-03-07 DIAGNOSIS — M6281 Muscle weakness (generalized): Secondary | ICD-10-CM | POA: Diagnosis not present

## 2018-03-07 DIAGNOSIS — M2560 Stiffness of unspecified joint, not elsewhere classified: Secondary | ICD-10-CM | POA: Diagnosis not present

## 2018-03-11 DIAGNOSIS — M6281 Muscle weakness (generalized): Secondary | ICD-10-CM | POA: Diagnosis not present

## 2018-03-11 DIAGNOSIS — R2681 Unsteadiness on feet: Secondary | ICD-10-CM | POA: Diagnosis not present

## 2018-03-11 DIAGNOSIS — S39012S Strain of muscle, fascia and tendon of lower back, sequela: Secondary | ICD-10-CM | POA: Diagnosis not present

## 2018-03-11 DIAGNOSIS — R293 Abnormal posture: Secondary | ICD-10-CM | POA: Diagnosis not present

## 2018-03-11 DIAGNOSIS — M2560 Stiffness of unspecified joint, not elsewhere classified: Secondary | ICD-10-CM | POA: Diagnosis not present

## 2018-03-13 DIAGNOSIS — S39012S Strain of muscle, fascia and tendon of lower back, sequela: Secondary | ICD-10-CM | POA: Diagnosis not present

## 2018-03-13 DIAGNOSIS — M2560 Stiffness of unspecified joint, not elsewhere classified: Secondary | ICD-10-CM | POA: Diagnosis not present

## 2018-03-13 DIAGNOSIS — R293 Abnormal posture: Secondary | ICD-10-CM | POA: Diagnosis not present

## 2018-03-13 DIAGNOSIS — R2681 Unsteadiness on feet: Secondary | ICD-10-CM | POA: Diagnosis not present

## 2018-03-13 DIAGNOSIS — M6281 Muscle weakness (generalized): Secondary | ICD-10-CM | POA: Diagnosis not present

## 2018-03-19 DIAGNOSIS — M6281 Muscle weakness (generalized): Secondary | ICD-10-CM | POA: Diagnosis not present

## 2018-03-19 DIAGNOSIS — R2681 Unsteadiness on feet: Secondary | ICD-10-CM | POA: Diagnosis not present

## 2018-03-19 DIAGNOSIS — R293 Abnormal posture: Secondary | ICD-10-CM | POA: Diagnosis not present

## 2018-03-19 DIAGNOSIS — S39012S Strain of muscle, fascia and tendon of lower back, sequela: Secondary | ICD-10-CM | POA: Diagnosis not present

## 2018-03-19 DIAGNOSIS — M2560 Stiffness of unspecified joint, not elsewhere classified: Secondary | ICD-10-CM | POA: Diagnosis not present

## 2018-04-14 DIAGNOSIS — L603 Nail dystrophy: Secondary | ICD-10-CM | POA: Diagnosis not present

## 2018-04-14 DIAGNOSIS — E1151 Type 2 diabetes mellitus with diabetic peripheral angiopathy without gangrene: Secondary | ICD-10-CM | POA: Diagnosis not present

## 2018-04-14 DIAGNOSIS — I739 Peripheral vascular disease, unspecified: Secondary | ICD-10-CM | POA: Diagnosis not present

## 2018-04-22 DIAGNOSIS — L570 Actinic keratosis: Secondary | ICD-10-CM | POA: Diagnosis not present

## 2018-04-22 DIAGNOSIS — L578 Other skin changes due to chronic exposure to nonionizing radiation: Secondary | ICD-10-CM | POA: Diagnosis not present

## 2018-04-22 DIAGNOSIS — L821 Other seborrheic keratosis: Secondary | ICD-10-CM | POA: Diagnosis not present

## 2018-05-05 DIAGNOSIS — J309 Allergic rhinitis, unspecified: Secondary | ICD-10-CM | POA: Diagnosis not present

## 2018-05-05 DIAGNOSIS — J019 Acute sinusitis, unspecified: Secondary | ICD-10-CM | POA: Diagnosis not present

## 2018-05-05 DIAGNOSIS — J209 Acute bronchitis, unspecified: Secondary | ICD-10-CM | POA: Diagnosis not present

## 2018-05-05 DIAGNOSIS — Z6827 Body mass index (BMI) 27.0-27.9, adult: Secondary | ICD-10-CM | POA: Diagnosis not present

## 2018-05-29 DIAGNOSIS — R05 Cough: Secondary | ICD-10-CM | POA: Diagnosis not present

## 2018-06-16 DIAGNOSIS — E1151 Type 2 diabetes mellitus with diabetic peripheral angiopathy without gangrene: Secondary | ICD-10-CM | POA: Diagnosis not present

## 2018-06-16 DIAGNOSIS — I739 Peripheral vascular disease, unspecified: Secondary | ICD-10-CM | POA: Diagnosis not present

## 2018-06-16 DIAGNOSIS — L603 Nail dystrophy: Secondary | ICD-10-CM | POA: Diagnosis not present

## 2018-06-23 DIAGNOSIS — K219 Gastro-esophageal reflux disease without esophagitis: Secondary | ICD-10-CM | POA: Diagnosis not present

## 2018-06-23 DIAGNOSIS — R05 Cough: Secondary | ICD-10-CM | POA: Diagnosis not present

## 2018-06-23 DIAGNOSIS — E785 Hyperlipidemia, unspecified: Secondary | ICD-10-CM | POA: Diagnosis not present

## 2018-06-23 DIAGNOSIS — E1169 Type 2 diabetes mellitus with other specified complication: Secondary | ICD-10-CM | POA: Diagnosis not present

## 2018-08-20 DIAGNOSIS — I739 Peripheral vascular disease, unspecified: Secondary | ICD-10-CM | POA: Diagnosis not present

## 2018-08-20 DIAGNOSIS — L603 Nail dystrophy: Secondary | ICD-10-CM | POA: Diagnosis not present

## 2018-08-20 DIAGNOSIS — E1151 Type 2 diabetes mellitus with diabetic peripheral angiopathy without gangrene: Secondary | ICD-10-CM | POA: Diagnosis not present

## 2018-09-03 DIAGNOSIS — N3281 Overactive bladder: Secondary | ICD-10-CM | POA: Diagnosis not present

## 2018-09-03 DIAGNOSIS — N952 Postmenopausal atrophic vaginitis: Secondary | ICD-10-CM | POA: Diagnosis not present

## 2018-09-08 DIAGNOSIS — E785 Hyperlipidemia, unspecified: Secondary | ICD-10-CM | POA: Diagnosis not present

## 2018-09-08 DIAGNOSIS — E1169 Type 2 diabetes mellitus with other specified complication: Secondary | ICD-10-CM | POA: Diagnosis not present

## 2018-09-08 DIAGNOSIS — G609 Hereditary and idiopathic neuropathy, unspecified: Secondary | ICD-10-CM | POA: Diagnosis not present

## 2018-09-08 DIAGNOSIS — I10 Essential (primary) hypertension: Secondary | ICD-10-CM | POA: Diagnosis not present

## 2018-09-08 DIAGNOSIS — K219 Gastro-esophageal reflux disease without esophagitis: Secondary | ICD-10-CM | POA: Diagnosis not present

## 2018-09-08 DIAGNOSIS — Z0000 Encounter for general adult medical examination without abnormal findings: Secondary | ICD-10-CM | POA: Diagnosis not present

## 2018-09-08 DIAGNOSIS — Z79899 Other long term (current) drug therapy: Secondary | ICD-10-CM | POA: Diagnosis not present

## 2018-09-23 DIAGNOSIS — S2020XA Contusion of thorax, unspecified, initial encounter: Secondary | ICD-10-CM | POA: Diagnosis not present

## 2018-09-23 DIAGNOSIS — M25511 Pain in right shoulder: Secondary | ICD-10-CM | POA: Diagnosis not present

## 2018-10-01 DIAGNOSIS — E119 Type 2 diabetes mellitus without complications: Secondary | ICD-10-CM | POA: Diagnosis not present

## 2018-10-20 DIAGNOSIS — L570 Actinic keratosis: Secondary | ICD-10-CM | POA: Diagnosis not present

## 2018-10-20 DIAGNOSIS — L578 Other skin changes due to chronic exposure to nonionizing radiation: Secondary | ICD-10-CM | POA: Diagnosis not present

## 2018-10-20 DIAGNOSIS — L821 Other seborrheic keratosis: Secondary | ICD-10-CM | POA: Diagnosis not present

## 2018-10-28 DIAGNOSIS — L603 Nail dystrophy: Secondary | ICD-10-CM | POA: Diagnosis not present

## 2018-10-28 DIAGNOSIS — I739 Peripheral vascular disease, unspecified: Secondary | ICD-10-CM | POA: Diagnosis not present

## 2018-10-28 DIAGNOSIS — E1151 Type 2 diabetes mellitus with diabetic peripheral angiopathy without gangrene: Secondary | ICD-10-CM | POA: Diagnosis not present

## 2018-12-30 DIAGNOSIS — L603 Nail dystrophy: Secondary | ICD-10-CM | POA: Diagnosis not present

## 2018-12-30 DIAGNOSIS — I739 Peripheral vascular disease, unspecified: Secondary | ICD-10-CM | POA: Diagnosis not present

## 2018-12-30 DIAGNOSIS — L84 Corns and callosities: Secondary | ICD-10-CM | POA: Diagnosis not present

## 2018-12-30 DIAGNOSIS — E1151 Type 2 diabetes mellitus with diabetic peripheral angiopathy without gangrene: Secondary | ICD-10-CM | POA: Diagnosis not present

## 2019-01-05 DIAGNOSIS — N3281 Overactive bladder: Secondary | ICD-10-CM | POA: Diagnosis not present

## 2019-01-05 DIAGNOSIS — N952 Postmenopausal atrophic vaginitis: Secondary | ICD-10-CM | POA: Diagnosis not present

## 2019-01-19 DIAGNOSIS — Z23 Encounter for immunization: Secondary | ICD-10-CM | POA: Diagnosis not present

## 2019-01-26 DIAGNOSIS — E1169 Type 2 diabetes mellitus with other specified complication: Secondary | ICD-10-CM | POA: Diagnosis not present

## 2019-01-26 DIAGNOSIS — E785 Hyperlipidemia, unspecified: Secondary | ICD-10-CM | POA: Diagnosis not present

## 2019-01-26 DIAGNOSIS — Z79899 Other long term (current) drug therapy: Secondary | ICD-10-CM | POA: Diagnosis not present

## 2019-01-30 DIAGNOSIS — E1169 Type 2 diabetes mellitus with other specified complication: Secondary | ICD-10-CM | POA: Diagnosis not present

## 2019-01-30 DIAGNOSIS — G609 Hereditary and idiopathic neuropathy, unspecified: Secondary | ICD-10-CM | POA: Diagnosis not present

## 2019-01-30 DIAGNOSIS — I10 Essential (primary) hypertension: Secondary | ICD-10-CM | POA: Diagnosis not present

## 2019-01-30 DIAGNOSIS — E785 Hyperlipidemia, unspecified: Secondary | ICD-10-CM | POA: Diagnosis not present

## 2019-02-24 DIAGNOSIS — Z6827 Body mass index (BMI) 27.0-27.9, adult: Secondary | ICD-10-CM | POA: Diagnosis not present

## 2019-02-24 DIAGNOSIS — G542 Cervical root disorders, not elsewhere classified: Secondary | ICD-10-CM | POA: Diagnosis not present

## 2019-03-04 DIAGNOSIS — L603 Nail dystrophy: Secondary | ICD-10-CM | POA: Diagnosis not present

## 2019-03-04 DIAGNOSIS — E1151 Type 2 diabetes mellitus with diabetic peripheral angiopathy without gangrene: Secondary | ICD-10-CM | POA: Diagnosis not present

## 2019-03-04 DIAGNOSIS — I739 Peripheral vascular disease, unspecified: Secondary | ICD-10-CM | POA: Diagnosis not present

## 2019-03-10 DIAGNOSIS — K5904 Chronic idiopathic constipation: Secondary | ICD-10-CM | POA: Diagnosis not present

## 2019-03-10 DIAGNOSIS — R1310 Dysphagia, unspecified: Secondary | ICD-10-CM | POA: Diagnosis not present

## 2019-03-13 DIAGNOSIS — I10 Essential (primary) hypertension: Secondary | ICD-10-CM | POA: Diagnosis not present

## 2019-03-13 DIAGNOSIS — E785 Hyperlipidemia, unspecified: Secondary | ICD-10-CM | POA: Diagnosis not present

## 2019-03-23 DIAGNOSIS — S300XXA Contusion of lower back and pelvis, initial encounter: Secondary | ICD-10-CM | POA: Diagnosis not present

## 2019-03-23 DIAGNOSIS — I10 Essential (primary) hypertension: Secondary | ICD-10-CM | POA: Diagnosis not present

## 2019-03-23 DIAGNOSIS — S336XXA Sprain of sacroiliac joint, initial encounter: Secondary | ICD-10-CM | POA: Diagnosis not present

## 2019-03-23 DIAGNOSIS — E1169 Type 2 diabetes mellitus with other specified complication: Secondary | ICD-10-CM | POA: Diagnosis not present

## 2019-04-03 DIAGNOSIS — R0902 Hypoxemia: Secondary | ICD-10-CM | POA: Diagnosis not present

## 2019-04-03 DIAGNOSIS — S0990XA Unspecified injury of head, initial encounter: Secondary | ICD-10-CM | POA: Diagnosis not present

## 2019-04-03 DIAGNOSIS — Z96653 Presence of artificial knee joint, bilateral: Secondary | ICD-10-CM | POA: Diagnosis not present

## 2019-04-03 DIAGNOSIS — R42 Dizziness and giddiness: Secondary | ICD-10-CM | POA: Diagnosis not present

## 2019-04-03 DIAGNOSIS — R55 Syncope and collapse: Secondary | ICD-10-CM | POA: Diagnosis not present

## 2019-04-03 DIAGNOSIS — S299XXA Unspecified injury of thorax, initial encounter: Secondary | ICD-10-CM | POA: Diagnosis not present

## 2019-04-03 DIAGNOSIS — E119 Type 2 diabetes mellitus without complications: Secondary | ICD-10-CM | POA: Diagnosis not present

## 2019-04-03 DIAGNOSIS — E785 Hyperlipidemia, unspecified: Secondary | ICD-10-CM | POA: Diagnosis not present

## 2019-04-03 DIAGNOSIS — Z20828 Contact with and (suspected) exposure to other viral communicable diseases: Secondary | ICD-10-CM | POA: Diagnosis not present

## 2019-04-03 DIAGNOSIS — W1789XA Other fall from one level to another, initial encounter: Secondary | ICD-10-CM | POA: Diagnosis not present

## 2019-04-03 DIAGNOSIS — S065X0A Traumatic subdural hemorrhage without loss of consciousness, initial encounter: Secondary | ICD-10-CM | POA: Diagnosis not present

## 2019-04-03 DIAGNOSIS — S065X9A Traumatic subdural hemorrhage with loss of consciousness of unspecified duration, initial encounter: Secondary | ICD-10-CM | POA: Diagnosis not present

## 2019-04-03 DIAGNOSIS — I10 Essential (primary) hypertension: Secondary | ICD-10-CM | POA: Diagnosis not present

## 2019-04-03 DIAGNOSIS — R52 Pain, unspecified: Secondary | ICD-10-CM | POA: Diagnosis not present

## 2019-04-03 DIAGNOSIS — Y998 Other external cause status: Secondary | ICD-10-CM | POA: Diagnosis not present

## 2019-04-03 DIAGNOSIS — G4489 Other headache syndrome: Secondary | ICD-10-CM | POA: Diagnosis not present

## 2019-04-03 DIAGNOSIS — Z043 Encounter for examination and observation following other accident: Secondary | ICD-10-CM | POA: Diagnosis not present

## 2019-04-03 DIAGNOSIS — W19XXXA Unspecified fall, initial encounter: Secondary | ICD-10-CM | POA: Diagnosis not present

## 2019-04-03 DIAGNOSIS — I471 Supraventricular tachycardia: Secondary | ICD-10-CM | POA: Diagnosis not present

## 2019-04-04 DIAGNOSIS — Z20828 Contact with and (suspected) exposure to other viral communicable diseases: Secondary | ICD-10-CM | POA: Diagnosis present

## 2019-04-04 DIAGNOSIS — I4510 Unspecified right bundle-branch block: Secondary | ICD-10-CM | POA: Diagnosis not present

## 2019-04-04 DIAGNOSIS — S065X9A Traumatic subdural hemorrhage with loss of consciousness of unspecified duration, initial encounter: Secondary | ICD-10-CM | POA: Diagnosis present

## 2019-04-04 DIAGNOSIS — I452 Bifascicular block: Secondary | ICD-10-CM | POA: Diagnosis not present

## 2019-04-04 DIAGNOSIS — I517 Cardiomegaly: Secondary | ICD-10-CM | POA: Diagnosis not present

## 2019-04-04 DIAGNOSIS — W1789XA Other fall from one level to another, initial encounter: Secondary | ICD-10-CM | POA: Diagnosis not present

## 2019-04-04 DIAGNOSIS — I495 Sick sinus syndrome: Secondary | ICD-10-CM | POA: Diagnosis not present

## 2019-04-04 DIAGNOSIS — E119 Type 2 diabetes mellitus without complications: Secondary | ICD-10-CM | POA: Diagnosis present

## 2019-04-04 DIAGNOSIS — I313 Pericardial effusion (noninflammatory): Secondary | ICD-10-CM | POA: Diagnosis not present

## 2019-04-04 DIAGNOSIS — I083 Combined rheumatic disorders of mitral, aortic and tricuspid valves: Secondary | ICD-10-CM | POA: Diagnosis not present

## 2019-04-04 DIAGNOSIS — Z95811 Presence of heart assist device: Secondary | ICD-10-CM | POA: Diagnosis not present

## 2019-04-04 DIAGNOSIS — R001 Bradycardia, unspecified: Secondary | ICD-10-CM | POA: Diagnosis not present

## 2019-04-04 DIAGNOSIS — R55 Syncope and collapse: Secondary | ICD-10-CM | POA: Diagnosis not present

## 2019-04-04 DIAGNOSIS — I4891 Unspecified atrial fibrillation: Secondary | ICD-10-CM | POA: Diagnosis not present

## 2019-04-04 DIAGNOSIS — E785 Hyperlipidemia, unspecified: Secondary | ICD-10-CM | POA: Diagnosis present

## 2019-04-04 DIAGNOSIS — Z7982 Long term (current) use of aspirin: Secondary | ICD-10-CM | POA: Diagnosis not present

## 2019-04-04 DIAGNOSIS — R000 Tachycardia, unspecified: Secondary | ICD-10-CM | POA: Diagnosis not present

## 2019-04-04 DIAGNOSIS — I471 Supraventricular tachycardia: Secondary | ICD-10-CM | POA: Diagnosis not present

## 2019-04-04 DIAGNOSIS — I444 Left anterior fascicular block: Secondary | ICD-10-CM | POA: Diagnosis not present

## 2019-04-04 DIAGNOSIS — Z90710 Acquired absence of both cervix and uterus: Secondary | ICD-10-CM | POA: Diagnosis not present

## 2019-04-04 DIAGNOSIS — I10 Essential (primary) hypertension: Secondary | ICD-10-CM | POA: Diagnosis present

## 2019-04-04 DIAGNOSIS — Z96653 Presence of artificial knee joint, bilateral: Secondary | ICD-10-CM | POA: Diagnosis present

## 2019-04-08 DIAGNOSIS — W19XXXA Unspecified fall, initial encounter: Secondary | ICD-10-CM

## 2019-04-08 DIAGNOSIS — S065XAA Traumatic subdural hemorrhage with loss of consciousness status unknown, initial encounter: Secondary | ICD-10-CM

## 2019-04-08 HISTORY — DX: Traumatic subdural hemorrhage with loss of consciousness status unknown, initial encounter: S06.5XAA

## 2019-04-08 HISTORY — DX: Unspecified fall, initial encounter: W19.XXXA

## 2019-04-12 DIAGNOSIS — E785 Hyperlipidemia, unspecified: Secondary | ICD-10-CM | POA: Diagnosis not present

## 2019-04-12 DIAGNOSIS — Z9181 History of falling: Secondary | ICD-10-CM | POA: Diagnosis not present

## 2019-04-12 DIAGNOSIS — I495 Sick sinus syndrome: Secondary | ICD-10-CM | POA: Diagnosis not present

## 2019-04-12 DIAGNOSIS — K219 Gastro-esophageal reflux disease without esophagitis: Secondary | ICD-10-CM | POA: Diagnosis not present

## 2019-04-12 DIAGNOSIS — E119 Type 2 diabetes mellitus without complications: Secondary | ICD-10-CM | POA: Diagnosis not present

## 2019-04-12 DIAGNOSIS — I471 Supraventricular tachycardia: Secondary | ICD-10-CM | POA: Diagnosis not present

## 2019-04-12 DIAGNOSIS — S065X0D Traumatic subdural hemorrhage without loss of consciousness, subsequent encounter: Secondary | ICD-10-CM | POA: Diagnosis not present

## 2019-04-12 DIAGNOSIS — I10 Essential (primary) hypertension: Secondary | ICD-10-CM | POA: Diagnosis not present

## 2019-04-12 DIAGNOSIS — Z48812 Encounter for surgical aftercare following surgery on the circulatory system: Secondary | ICD-10-CM | POA: Diagnosis not present

## 2019-04-12 DIAGNOSIS — Z7982 Long term (current) use of aspirin: Secondary | ICD-10-CM | POA: Diagnosis not present

## 2019-04-16 DIAGNOSIS — S065X0D Traumatic subdural hemorrhage without loss of consciousness, subsequent encounter: Secondary | ICD-10-CM | POA: Diagnosis not present

## 2019-04-20 DIAGNOSIS — Z950 Presence of cardiac pacemaker: Secondary | ICD-10-CM | POA: Diagnosis not present

## 2019-04-20 DIAGNOSIS — S065X0D Traumatic subdural hemorrhage without loss of consciousness, subsequent encounter: Secondary | ICD-10-CM | POA: Diagnosis not present

## 2019-04-20 DIAGNOSIS — I498 Other specified cardiac arrhythmias: Secondary | ICD-10-CM | POA: Diagnosis not present

## 2019-04-20 DIAGNOSIS — E663 Overweight: Secondary | ICD-10-CM | POA: Diagnosis not present

## 2019-04-21 DIAGNOSIS — Z45010 Encounter for checking and testing of cardiac pacemaker pulse generator [battery]: Secondary | ICD-10-CM | POA: Diagnosis not present

## 2019-04-23 DIAGNOSIS — L821 Other seborrheic keratosis: Secondary | ICD-10-CM | POA: Diagnosis not present

## 2019-04-23 DIAGNOSIS — L570 Actinic keratosis: Secondary | ICD-10-CM | POA: Diagnosis not present

## 2019-04-23 DIAGNOSIS — L578 Other skin changes due to chronic exposure to nonionizing radiation: Secondary | ICD-10-CM | POA: Diagnosis not present

## 2019-05-12 DIAGNOSIS — E119 Type 2 diabetes mellitus without complications: Secondary | ICD-10-CM | POA: Diagnosis not present

## 2019-05-12 DIAGNOSIS — Z9181 History of falling: Secondary | ICD-10-CM | POA: Diagnosis not present

## 2019-05-12 DIAGNOSIS — E785 Hyperlipidemia, unspecified: Secondary | ICD-10-CM | POA: Diagnosis not present

## 2019-05-12 DIAGNOSIS — S065X0D Traumatic subdural hemorrhage without loss of consciousness, subsequent encounter: Secondary | ICD-10-CM | POA: Diagnosis not present

## 2019-05-12 DIAGNOSIS — I471 Supraventricular tachycardia: Secondary | ICD-10-CM | POA: Diagnosis not present

## 2019-05-12 DIAGNOSIS — Z48812 Encounter for surgical aftercare following surgery on the circulatory system: Secondary | ICD-10-CM | POA: Diagnosis not present

## 2019-05-12 DIAGNOSIS — Z7982 Long term (current) use of aspirin: Secondary | ICD-10-CM | POA: Diagnosis not present

## 2019-05-12 DIAGNOSIS — I495 Sick sinus syndrome: Secondary | ICD-10-CM | POA: Diagnosis not present

## 2019-05-12 DIAGNOSIS — K219 Gastro-esophageal reflux disease without esophagitis: Secondary | ICD-10-CM | POA: Diagnosis not present

## 2019-05-12 DIAGNOSIS — I10 Essential (primary) hypertension: Secondary | ICD-10-CM | POA: Diagnosis not present

## 2019-05-19 DIAGNOSIS — Z7982 Long term (current) use of aspirin: Secondary | ICD-10-CM | POA: Diagnosis not present

## 2019-05-19 DIAGNOSIS — Z9181 History of falling: Secondary | ICD-10-CM | POA: Diagnosis not present

## 2019-05-19 DIAGNOSIS — Z48812 Encounter for surgical aftercare following surgery on the circulatory system: Secondary | ICD-10-CM | POA: Diagnosis not present

## 2019-05-19 DIAGNOSIS — I10 Essential (primary) hypertension: Secondary | ICD-10-CM | POA: Diagnosis not present

## 2019-05-19 DIAGNOSIS — E119 Type 2 diabetes mellitus without complications: Secondary | ICD-10-CM | POA: Diagnosis not present

## 2019-05-19 DIAGNOSIS — S065X0D Traumatic subdural hemorrhage without loss of consciousness, subsequent encounter: Secondary | ICD-10-CM | POA: Diagnosis not present

## 2019-05-25 DIAGNOSIS — M542 Cervicalgia: Secondary | ICD-10-CM | POA: Diagnosis not present

## 2019-05-25 DIAGNOSIS — U071 COVID-19: Secondary | ICD-10-CM | POA: Diagnosis not present

## 2019-05-25 DIAGNOSIS — Z20828 Contact with and (suspected) exposure to other viral communicable diseases: Secondary | ICD-10-CM | POA: Diagnosis not present

## 2019-05-25 DIAGNOSIS — R05 Cough: Secondary | ICD-10-CM | POA: Diagnosis not present

## 2019-06-09 DIAGNOSIS — Z48812 Encounter for surgical aftercare following surgery on the circulatory system: Secondary | ICD-10-CM | POA: Diagnosis not present

## 2019-06-09 DIAGNOSIS — Z7982 Long term (current) use of aspirin: Secondary | ICD-10-CM | POA: Diagnosis not present

## 2019-06-09 DIAGNOSIS — Z9181 History of falling: Secondary | ICD-10-CM | POA: Diagnosis not present

## 2019-06-09 DIAGNOSIS — I10 Essential (primary) hypertension: Secondary | ICD-10-CM | POA: Diagnosis not present

## 2019-06-09 DIAGNOSIS — S065X0D Traumatic subdural hemorrhage without loss of consciousness, subsequent encounter: Secondary | ICD-10-CM | POA: Diagnosis not present

## 2019-06-09 DIAGNOSIS — E119 Type 2 diabetes mellitus without complications: Secondary | ICD-10-CM | POA: Diagnosis not present

## 2019-06-30 DIAGNOSIS — I10 Essential (primary) hypertension: Secondary | ICD-10-CM | POA: Diagnosis not present

## 2019-06-30 DIAGNOSIS — R42 Dizziness and giddiness: Secondary | ICD-10-CM | POA: Diagnosis not present

## 2019-06-30 DIAGNOSIS — H81399 Other peripheral vertigo, unspecified ear: Secondary | ICD-10-CM | POA: Diagnosis not present

## 2019-06-30 DIAGNOSIS — Z950 Presence of cardiac pacemaker: Secondary | ICD-10-CM | POA: Diagnosis not present

## 2019-06-30 DIAGNOSIS — Z8673 Personal history of transient ischemic attack (TIA), and cerebral infarction without residual deficits: Secondary | ICD-10-CM | POA: Diagnosis not present

## 2019-06-30 DIAGNOSIS — R0602 Shortness of breath: Secondary | ICD-10-CM | POA: Diagnosis not present

## 2019-06-30 DIAGNOSIS — E119 Type 2 diabetes mellitus without complications: Secondary | ICD-10-CM | POA: Diagnosis not present

## 2019-07-03 DIAGNOSIS — I498 Other specified cardiac arrhythmias: Secondary | ICD-10-CM | POA: Diagnosis not present

## 2019-07-03 DIAGNOSIS — E1169 Type 2 diabetes mellitus with other specified complication: Secondary | ICD-10-CM | POA: Diagnosis not present

## 2019-07-03 DIAGNOSIS — R42 Dizziness and giddiness: Secondary | ICD-10-CM | POA: Diagnosis not present

## 2019-07-03 DIAGNOSIS — Z950 Presence of cardiac pacemaker: Secondary | ICD-10-CM | POA: Diagnosis not present

## 2019-07-07 DIAGNOSIS — H905 Unspecified sensorineural hearing loss: Secondary | ICD-10-CM | POA: Diagnosis not present

## 2019-07-07 DIAGNOSIS — R42 Dizziness and giddiness: Secondary | ICD-10-CM | POA: Diagnosis not present

## 2019-07-08 DIAGNOSIS — N952 Postmenopausal atrophic vaginitis: Secondary | ICD-10-CM | POA: Diagnosis not present

## 2019-07-08 DIAGNOSIS — N3281 Overactive bladder: Secondary | ICD-10-CM | POA: Diagnosis not present

## 2019-07-20 DIAGNOSIS — I10 Essential (primary) hypertension: Secondary | ICD-10-CM | POA: Diagnosis not present

## 2019-07-20 DIAGNOSIS — I739 Peripheral vascular disease, unspecified: Secondary | ICD-10-CM | POA: Diagnosis not present

## 2019-07-20 DIAGNOSIS — L603 Nail dystrophy: Secondary | ICD-10-CM | POA: Diagnosis not present

## 2019-07-20 DIAGNOSIS — E119 Type 2 diabetes mellitus without complications: Secondary | ICD-10-CM | POA: Diagnosis not present

## 2019-07-20 DIAGNOSIS — R55 Syncope and collapse: Secondary | ICD-10-CM | POA: Diagnosis not present

## 2019-07-20 DIAGNOSIS — Z950 Presence of cardiac pacemaker: Secondary | ICD-10-CM | POA: Diagnosis not present

## 2019-07-20 DIAGNOSIS — E1151 Type 2 diabetes mellitus with diabetic peripheral angiopathy without gangrene: Secondary | ICD-10-CM | POA: Diagnosis not present

## 2019-07-20 DIAGNOSIS — I495 Sick sinus syndrome: Secondary | ICD-10-CM | POA: Diagnosis not present

## 2019-07-23 DIAGNOSIS — R001 Bradycardia, unspecified: Secondary | ICD-10-CM | POA: Diagnosis not present

## 2019-07-23 DIAGNOSIS — Z45018 Encounter for adjustment and management of other part of cardiac pacemaker: Secondary | ICD-10-CM | POA: Diagnosis not present

## 2019-07-23 DIAGNOSIS — Z950 Presence of cardiac pacemaker: Secondary | ICD-10-CM | POA: Diagnosis not present

## 2019-08-04 DIAGNOSIS — Z79899 Other long term (current) drug therapy: Secondary | ICD-10-CM | POA: Diagnosis not present

## 2019-08-04 DIAGNOSIS — I495 Sick sinus syndrome: Secondary | ICD-10-CM | POA: Diagnosis not present

## 2019-08-04 DIAGNOSIS — I471 Supraventricular tachycardia: Secondary | ICD-10-CM | POA: Diagnosis not present

## 2019-08-04 DIAGNOSIS — I493 Ventricular premature depolarization: Secondary | ICD-10-CM | POA: Diagnosis not present

## 2019-08-04 DIAGNOSIS — I4892 Unspecified atrial flutter: Secondary | ICD-10-CM | POA: Diagnosis not present

## 2019-08-04 DIAGNOSIS — Z7901 Long term (current) use of anticoagulants: Secondary | ICD-10-CM | POA: Diagnosis not present

## 2019-08-06 DIAGNOSIS — I493 Ventricular premature depolarization: Secondary | ICD-10-CM | POA: Diagnosis not present

## 2019-08-06 DIAGNOSIS — I4510 Unspecified right bundle-branch block: Secondary | ICD-10-CM | POA: Diagnosis not present

## 2019-08-06 DIAGNOSIS — I452 Bifascicular block: Secondary | ICD-10-CM | POA: Diagnosis not present

## 2019-08-06 DIAGNOSIS — I444 Left anterior fascicular block: Secondary | ICD-10-CM | POA: Diagnosis not present

## 2019-08-18 DIAGNOSIS — R0602 Shortness of breath: Secondary | ICD-10-CM | POA: Diagnosis not present

## 2019-08-18 DIAGNOSIS — I4892 Unspecified atrial flutter: Secondary | ICD-10-CM | POA: Diagnosis not present

## 2019-09-07 DIAGNOSIS — Z79899 Other long term (current) drug therapy: Secondary | ICD-10-CM | POA: Diagnosis not present

## 2019-09-07 DIAGNOSIS — R4181 Age-related cognitive decline: Secondary | ICD-10-CM | POA: Diagnosis not present

## 2019-09-07 DIAGNOSIS — N183 Chronic kidney disease, stage 3 unspecified: Secondary | ICD-10-CM | POA: Diagnosis not present

## 2019-09-07 DIAGNOSIS — E785 Hyperlipidemia, unspecified: Secondary | ICD-10-CM | POA: Diagnosis not present

## 2019-09-07 DIAGNOSIS — E1169 Type 2 diabetes mellitus with other specified complication: Secondary | ICD-10-CM | POA: Diagnosis not present

## 2019-09-07 DIAGNOSIS — R519 Headache, unspecified: Secondary | ICD-10-CM | POA: Diagnosis not present

## 2019-09-21 DIAGNOSIS — I739 Peripheral vascular disease, unspecified: Secondary | ICD-10-CM | POA: Diagnosis not present

## 2019-09-21 DIAGNOSIS — L603 Nail dystrophy: Secondary | ICD-10-CM | POA: Diagnosis not present

## 2019-09-21 DIAGNOSIS — E1151 Type 2 diabetes mellitus with diabetic peripheral angiopathy without gangrene: Secondary | ICD-10-CM | POA: Diagnosis not present

## 2019-10-05 DIAGNOSIS — H2513 Age-related nuclear cataract, bilateral: Secondary | ICD-10-CM | POA: Diagnosis not present

## 2019-10-19 DIAGNOSIS — Z8679 Personal history of other diseases of the circulatory system: Secondary | ICD-10-CM | POA: Diagnosis not present

## 2019-10-19 DIAGNOSIS — Z950 Presence of cardiac pacemaker: Secondary | ICD-10-CM | POA: Diagnosis not present

## 2019-10-19 DIAGNOSIS — I495 Sick sinus syndrome: Secondary | ICD-10-CM | POA: Diagnosis not present

## 2019-10-26 DIAGNOSIS — L570 Actinic keratosis: Secondary | ICD-10-CM | POA: Diagnosis not present

## 2019-10-26 DIAGNOSIS — L578 Other skin changes due to chronic exposure to nonionizing radiation: Secondary | ICD-10-CM | POA: Diagnosis not present

## 2019-10-26 DIAGNOSIS — L821 Other seborrheic keratosis: Secondary | ICD-10-CM | POA: Diagnosis not present

## 2019-10-26 DIAGNOSIS — C44329 Squamous cell carcinoma of skin of other parts of face: Secondary | ICD-10-CM | POA: Diagnosis not present

## 2019-11-18 DIAGNOSIS — R519 Headache, unspecified: Secondary | ICD-10-CM | POA: Diagnosis not present

## 2020-01-12 DIAGNOSIS — I498 Other specified cardiac arrhythmias: Secondary | ICD-10-CM | POA: Diagnosis not present

## 2020-01-12 DIAGNOSIS — I951 Orthostatic hypotension: Secondary | ICD-10-CM | POA: Diagnosis not present

## 2020-01-12 DIAGNOSIS — E039 Hypothyroidism, unspecified: Secondary | ICD-10-CM | POA: Diagnosis not present

## 2020-01-12 DIAGNOSIS — R4181 Age-related cognitive decline: Secondary | ICD-10-CM | POA: Diagnosis not present

## 2020-01-19 DIAGNOSIS — I495 Sick sinus syndrome: Secondary | ICD-10-CM | POA: Diagnosis not present

## 2020-01-19 DIAGNOSIS — Z45018 Encounter for adjustment and management of other part of cardiac pacemaker: Secondary | ICD-10-CM | POA: Diagnosis not present

## 2020-01-30 DIAGNOSIS — Z23 Encounter for immunization: Secondary | ICD-10-CM | POA: Diagnosis not present

## 2020-02-02 DIAGNOSIS — I5020 Unspecified systolic (congestive) heart failure: Secondary | ICD-10-CM | POA: Diagnosis not present

## 2020-02-02 DIAGNOSIS — I495 Sick sinus syndrome: Secondary | ICD-10-CM | POA: Diagnosis not present

## 2020-02-02 DIAGNOSIS — I4892 Unspecified atrial flutter: Secondary | ICD-10-CM | POA: Diagnosis not present

## 2020-02-02 DIAGNOSIS — Z8673 Personal history of transient ischemic attack (TIA), and cerebral infarction without residual deficits: Secondary | ICD-10-CM | POA: Diagnosis not present

## 2020-02-02 DIAGNOSIS — E119 Type 2 diabetes mellitus without complications: Secondary | ICD-10-CM | POA: Diagnosis not present

## 2020-02-02 DIAGNOSIS — K219 Gastro-esophageal reflux disease without esophagitis: Secondary | ICD-10-CM | POA: Diagnosis not present

## 2020-02-02 DIAGNOSIS — Z950 Presence of cardiac pacemaker: Secondary | ICD-10-CM | POA: Diagnosis not present

## 2020-02-02 DIAGNOSIS — Z7901 Long term (current) use of anticoagulants: Secondary | ICD-10-CM | POA: Diagnosis not present

## 2020-02-02 DIAGNOSIS — E785 Hyperlipidemia, unspecified: Secondary | ICD-10-CM | POA: Diagnosis not present

## 2020-02-02 DIAGNOSIS — I471 Supraventricular tachycardia: Secondary | ICD-10-CM | POA: Diagnosis not present

## 2020-02-02 DIAGNOSIS — I110 Hypertensive heart disease with heart failure: Secondary | ICD-10-CM | POA: Diagnosis not present

## 2020-02-03 DIAGNOSIS — Z950 Presence of cardiac pacemaker: Secondary | ICD-10-CM | POA: Diagnosis not present

## 2020-02-03 DIAGNOSIS — I447 Left bundle-branch block, unspecified: Secondary | ICD-10-CM | POA: Diagnosis not present

## 2020-02-11 DIAGNOSIS — I739 Peripheral vascular disease, unspecified: Secondary | ICD-10-CM | POA: Diagnosis not present

## 2020-02-11 DIAGNOSIS — E1151 Type 2 diabetes mellitus with diabetic peripheral angiopathy without gangrene: Secondary | ICD-10-CM | POA: Diagnosis not present

## 2020-02-11 DIAGNOSIS — L603 Nail dystrophy: Secondary | ICD-10-CM | POA: Diagnosis not present

## 2020-02-26 DIAGNOSIS — L821 Other seborrheic keratosis: Secondary | ICD-10-CM | POA: Diagnosis not present

## 2020-02-26 DIAGNOSIS — L578 Other skin changes due to chronic exposure to nonionizing radiation: Secondary | ICD-10-CM | POA: Diagnosis not present

## 2020-02-26 DIAGNOSIS — L570 Actinic keratosis: Secondary | ICD-10-CM | POA: Diagnosis not present

## 2020-03-17 DIAGNOSIS — Z23 Encounter for immunization: Secondary | ICD-10-CM | POA: Diagnosis not present

## 2020-04-14 DIAGNOSIS — L603 Nail dystrophy: Secondary | ICD-10-CM | POA: Diagnosis not present

## 2020-04-14 DIAGNOSIS — I739 Peripheral vascular disease, unspecified: Secondary | ICD-10-CM | POA: Diagnosis not present

## 2020-04-14 DIAGNOSIS — E1151 Type 2 diabetes mellitus with diabetic peripheral angiopathy without gangrene: Secondary | ICD-10-CM | POA: Diagnosis not present

## 2020-04-19 DIAGNOSIS — Z45018 Encounter for adjustment and management of other part of cardiac pacemaker: Secondary | ICD-10-CM | POA: Diagnosis not present

## 2020-04-19 DIAGNOSIS — I495 Sick sinus syndrome: Secondary | ICD-10-CM | POA: Diagnosis not present

## 2020-04-19 DIAGNOSIS — R001 Bradycardia, unspecified: Secondary | ICD-10-CM | POA: Diagnosis not present

## 2020-05-09 DIAGNOSIS — N3281 Overactive bladder: Secondary | ICD-10-CM | POA: Diagnosis not present

## 2020-05-09 DIAGNOSIS — R413 Other amnesia: Secondary | ICD-10-CM | POA: Diagnosis not present

## 2020-05-09 DIAGNOSIS — N952 Postmenopausal atrophic vaginitis: Secondary | ICD-10-CM | POA: Diagnosis not present

## 2020-05-12 DIAGNOSIS — L03116 Cellulitis of left lower limb: Secondary | ICD-10-CM | POA: Diagnosis not present

## 2020-05-12 DIAGNOSIS — R600 Localized edema: Secondary | ICD-10-CM | POA: Diagnosis not present

## 2020-05-12 DIAGNOSIS — S8992XA Unspecified injury of left lower leg, initial encounter: Secondary | ICD-10-CM | POA: Diagnosis not present

## 2020-05-12 DIAGNOSIS — M7989 Other specified soft tissue disorders: Secondary | ICD-10-CM | POA: Diagnosis not present

## 2020-05-12 DIAGNOSIS — L0390 Cellulitis, unspecified: Secondary | ICD-10-CM | POA: Diagnosis not present

## 2020-06-16 DIAGNOSIS — L603 Nail dystrophy: Secondary | ICD-10-CM | POA: Diagnosis not present

## 2020-06-16 DIAGNOSIS — E1151 Type 2 diabetes mellitus with diabetic peripheral angiopathy without gangrene: Secondary | ICD-10-CM | POA: Diagnosis not present

## 2020-06-16 DIAGNOSIS — I739 Peripheral vascular disease, unspecified: Secondary | ICD-10-CM | POA: Diagnosis not present

## 2020-06-23 DIAGNOSIS — N3281 Overactive bladder: Secondary | ICD-10-CM | POA: Diagnosis not present

## 2020-06-23 DIAGNOSIS — N952 Postmenopausal atrophic vaginitis: Secondary | ICD-10-CM | POA: Diagnosis not present

## 2020-06-23 DIAGNOSIS — R413 Other amnesia: Secondary | ICD-10-CM | POA: Diagnosis not present

## 2020-07-19 DIAGNOSIS — Z45018 Encounter for adjustment and management of other part of cardiac pacemaker: Secondary | ICD-10-CM | POA: Diagnosis not present

## 2020-07-19 DIAGNOSIS — R001 Bradycardia, unspecified: Secondary | ICD-10-CM | POA: Diagnosis not present

## 2020-08-18 DIAGNOSIS — L603 Nail dystrophy: Secondary | ICD-10-CM | POA: Diagnosis not present

## 2020-08-18 DIAGNOSIS — E1151 Type 2 diabetes mellitus with diabetic peripheral angiopathy without gangrene: Secondary | ICD-10-CM | POA: Diagnosis not present

## 2020-08-18 DIAGNOSIS — I739 Peripheral vascular disease, unspecified: Secondary | ICD-10-CM | POA: Diagnosis not present

## 2020-08-20 DIAGNOSIS — Z23 Encounter for immunization: Secondary | ICD-10-CM | POA: Diagnosis not present

## 2020-08-30 DIAGNOSIS — I4892 Unspecified atrial flutter: Secondary | ICD-10-CM | POA: Diagnosis not present

## 2020-08-30 DIAGNOSIS — Z950 Presence of cardiac pacemaker: Secondary | ICD-10-CM | POA: Diagnosis not present

## 2020-08-30 DIAGNOSIS — I5020 Unspecified systolic (congestive) heart failure: Secondary | ICD-10-CM | POA: Diagnosis not present

## 2020-08-30 DIAGNOSIS — Z79899 Other long term (current) drug therapy: Secondary | ICD-10-CM | POA: Diagnosis not present

## 2020-08-30 DIAGNOSIS — I471 Supraventricular tachycardia: Secondary | ICD-10-CM | POA: Diagnosis not present

## 2020-08-30 DIAGNOSIS — E785 Hyperlipidemia, unspecified: Secondary | ICD-10-CM | POA: Diagnosis not present

## 2020-08-30 DIAGNOSIS — K219 Gastro-esophageal reflux disease without esophagitis: Secondary | ICD-10-CM | POA: Diagnosis not present

## 2020-08-30 DIAGNOSIS — Z7901 Long term (current) use of anticoagulants: Secondary | ICD-10-CM | POA: Diagnosis not present

## 2020-08-30 DIAGNOSIS — I495 Sick sinus syndrome: Secondary | ICD-10-CM | POA: Diagnosis not present

## 2020-08-30 DIAGNOSIS — I110 Hypertensive heart disease with heart failure: Secondary | ICD-10-CM | POA: Diagnosis not present

## 2020-08-30 DIAGNOSIS — E119 Type 2 diabetes mellitus without complications: Secondary | ICD-10-CM | POA: Diagnosis not present

## 2020-09-03 DIAGNOSIS — Z950 Presence of cardiac pacemaker: Secondary | ICD-10-CM | POA: Diagnosis not present

## 2020-09-03 DIAGNOSIS — I4510 Unspecified right bundle-branch block: Secondary | ICD-10-CM | POA: Diagnosis not present

## 2020-09-03 DIAGNOSIS — I4589 Other specified conduction disorders: Secondary | ICD-10-CM | POA: Diagnosis not present

## 2020-09-20 DIAGNOSIS — R339 Retention of urine, unspecified: Secondary | ICD-10-CM | POA: Diagnosis not present

## 2020-09-20 DIAGNOSIS — N3281 Overactive bladder: Secondary | ICD-10-CM | POA: Diagnosis not present

## 2020-09-20 DIAGNOSIS — N952 Postmenopausal atrophic vaginitis: Secondary | ICD-10-CM | POA: Diagnosis not present

## 2020-09-20 DIAGNOSIS — R413 Other amnesia: Secondary | ICD-10-CM | POA: Diagnosis not present

## 2020-10-03 DIAGNOSIS — H2513 Age-related nuclear cataract, bilateral: Secondary | ICD-10-CM | POA: Diagnosis not present

## 2020-10-18 DIAGNOSIS — R001 Bradycardia, unspecified: Secondary | ICD-10-CM | POA: Diagnosis not present

## 2020-10-18 DIAGNOSIS — Z45018 Encounter for adjustment and management of other part of cardiac pacemaker: Secondary | ICD-10-CM | POA: Diagnosis not present

## 2020-10-24 DIAGNOSIS — M25872 Other specified joint disorders, left ankle and foot: Secondary | ICD-10-CM | POA: Diagnosis not present

## 2020-10-24 DIAGNOSIS — E1169 Type 2 diabetes mellitus with other specified complication: Secondary | ICD-10-CM | POA: Diagnosis not present

## 2020-10-24 DIAGNOSIS — E039 Hypothyroidism, unspecified: Secondary | ICD-10-CM | POA: Diagnosis not present

## 2020-10-24 DIAGNOSIS — E785 Hyperlipidemia, unspecified: Secondary | ICD-10-CM | POA: Diagnosis not present

## 2020-10-24 DIAGNOSIS — G63 Polyneuropathy in diseases classified elsewhere: Secondary | ICD-10-CM | POA: Diagnosis not present

## 2020-11-08 DIAGNOSIS — E1151 Type 2 diabetes mellitus with diabetic peripheral angiopathy without gangrene: Secondary | ICD-10-CM | POA: Diagnosis not present

## 2020-11-08 DIAGNOSIS — L603 Nail dystrophy: Secondary | ICD-10-CM | POA: Diagnosis not present

## 2020-11-08 DIAGNOSIS — I739 Peripheral vascular disease, unspecified: Secondary | ICD-10-CM | POA: Diagnosis not present

## 2020-11-21 DIAGNOSIS — M533 Sacrococcygeal disorders, not elsewhere classified: Secondary | ICD-10-CM | POA: Diagnosis not present

## 2020-11-21 DIAGNOSIS — M4185 Other forms of scoliosis, thoracolumbar region: Secondary | ICD-10-CM | POA: Diagnosis not present

## 2020-11-21 DIAGNOSIS — M539 Dorsopathy, unspecified: Secondary | ICD-10-CM | POA: Diagnosis not present

## 2020-11-21 DIAGNOSIS — M4306 Spondylolysis, lumbar region: Secondary | ICD-10-CM | POA: Diagnosis not present

## 2020-11-22 DIAGNOSIS — U071 COVID-19: Secondary | ICD-10-CM | POA: Diagnosis not present

## 2021-01-10 DIAGNOSIS — E1151 Type 2 diabetes mellitus with diabetic peripheral angiopathy without gangrene: Secondary | ICD-10-CM | POA: Diagnosis not present

## 2021-01-10 DIAGNOSIS — L603 Nail dystrophy: Secondary | ICD-10-CM | POA: Diagnosis not present

## 2021-01-10 DIAGNOSIS — I739 Peripheral vascular disease, unspecified: Secondary | ICD-10-CM | POA: Diagnosis not present

## 2021-01-17 DIAGNOSIS — Z45018 Encounter for adjustment and management of other part of cardiac pacemaker: Secondary | ICD-10-CM | POA: Diagnosis not present

## 2021-01-17 DIAGNOSIS — I495 Sick sinus syndrome: Secondary | ICD-10-CM | POA: Diagnosis not present

## 2021-01-18 DIAGNOSIS — R001 Bradycardia, unspecified: Secondary | ICD-10-CM | POA: Diagnosis not present

## 2021-01-18 DIAGNOSIS — Z45018 Encounter for adjustment and management of other part of cardiac pacemaker: Secondary | ICD-10-CM | POA: Diagnosis not present

## 2021-01-19 DIAGNOSIS — E785 Hyperlipidemia, unspecified: Secondary | ICD-10-CM | POA: Diagnosis not present

## 2021-01-19 DIAGNOSIS — I872 Venous insufficiency (chronic) (peripheral): Secondary | ICD-10-CM | POA: Diagnosis not present

## 2021-01-19 DIAGNOSIS — E1169 Type 2 diabetes mellitus with other specified complication: Secondary | ICD-10-CM | POA: Diagnosis not present

## 2021-01-19 DIAGNOSIS — R4181 Age-related cognitive decline: Secondary | ICD-10-CM | POA: Diagnosis not present

## 2021-01-19 DIAGNOSIS — Z79899 Other long term (current) drug therapy: Secondary | ICD-10-CM | POA: Diagnosis not present

## 2021-01-19 DIAGNOSIS — E039 Hypothyroidism, unspecified: Secondary | ICD-10-CM | POA: Diagnosis not present

## 2021-01-19 DIAGNOSIS — Z0000 Encounter for general adult medical examination without abnormal findings: Secondary | ICD-10-CM | POA: Diagnosis not present

## 2021-01-19 DIAGNOSIS — M8580 Other specified disorders of bone density and structure, unspecified site: Secondary | ICD-10-CM | POA: Diagnosis not present

## 2021-01-28 DIAGNOSIS — Z23 Encounter for immunization: Secondary | ICD-10-CM | POA: Diagnosis not present

## 2021-02-28 DIAGNOSIS — Z950 Presence of cardiac pacemaker: Secondary | ICD-10-CM | POA: Diagnosis not present

## 2021-02-28 DIAGNOSIS — E663 Overweight: Secondary | ICD-10-CM | POA: Diagnosis not present

## 2021-02-28 DIAGNOSIS — I498 Other specified cardiac arrhythmias: Secondary | ICD-10-CM | POA: Diagnosis not present

## 2021-02-28 DIAGNOSIS — K5901 Slow transit constipation: Secondary | ICD-10-CM | POA: Diagnosis not present

## 2021-03-02 ENCOUNTER — Other Ambulatory Visit: Payer: Self-pay

## 2021-03-02 DIAGNOSIS — I10 Essential (primary) hypertension: Secondary | ICD-10-CM | POA: Insufficient documentation

## 2021-03-02 DIAGNOSIS — M8580 Other specified disorders of bone density and structure, unspecified site: Secondary | ICD-10-CM | POA: Insufficient documentation

## 2021-03-02 DIAGNOSIS — N3281 Overactive bladder: Secondary | ICD-10-CM | POA: Insufficient documentation

## 2021-03-02 DIAGNOSIS — Z950 Presence of cardiac pacemaker: Secondary | ICD-10-CM | POA: Insufficient documentation

## 2021-03-02 DIAGNOSIS — E1169 Type 2 diabetes mellitus with other specified complication: Secondary | ICD-10-CM | POA: Insufficient documentation

## 2021-03-02 DIAGNOSIS — M539 Dorsopathy, unspecified: Secondary | ICD-10-CM | POA: Insufficient documentation

## 2021-03-02 DIAGNOSIS — S99912A Unspecified injury of left ankle, initial encounter: Secondary | ICD-10-CM | POA: Insufficient documentation

## 2021-03-02 DIAGNOSIS — K2960 Other gastritis without bleeding: Secondary | ICD-10-CM | POA: Insufficient documentation

## 2021-03-02 DIAGNOSIS — M47816 Spondylosis without myelopathy or radiculopathy, lumbar region: Secondary | ICD-10-CM | POA: Insufficient documentation

## 2021-03-02 DIAGNOSIS — M1990 Unspecified osteoarthritis, unspecified site: Secondary | ICD-10-CM | POA: Insufficient documentation

## 2021-03-02 DIAGNOSIS — I498 Other specified cardiac arrhythmias: Secondary | ICD-10-CM | POA: Insufficient documentation

## 2021-03-02 DIAGNOSIS — E785 Hyperlipidemia, unspecified: Secondary | ICD-10-CM | POA: Insufficient documentation

## 2021-03-02 DIAGNOSIS — G709 Myoneural disorder, unspecified: Secondary | ICD-10-CM | POA: Insufficient documentation

## 2021-03-02 DIAGNOSIS — I4510 Unspecified right bundle-branch block: Secondary | ICD-10-CM | POA: Insufficient documentation

## 2021-03-02 DIAGNOSIS — N1831 Chronic kidney disease, stage 3a: Secondary | ICD-10-CM | POA: Insufficient documentation

## 2021-03-02 DIAGNOSIS — G63 Polyneuropathy in diseases classified elsewhere: Secondary | ICD-10-CM | POA: Insufficient documentation

## 2021-03-02 DIAGNOSIS — D6869 Other thrombophilia: Secondary | ICD-10-CM | POA: Insufficient documentation

## 2021-03-02 DIAGNOSIS — E039 Hypothyroidism, unspecified: Secondary | ICD-10-CM | POA: Insufficient documentation

## 2021-03-02 DIAGNOSIS — D649 Anemia, unspecified: Secondary | ICD-10-CM | POA: Insufficient documentation

## 2021-03-20 DIAGNOSIS — Z7901 Long term (current) use of anticoagulants: Secondary | ICD-10-CM

## 2021-03-20 DIAGNOSIS — I4892 Unspecified atrial flutter: Secondary | ICD-10-CM

## 2021-03-20 HISTORY — DX: Unspecified atrial flutter: I48.92

## 2021-03-20 HISTORY — DX: Long term (current) use of anticoagulants: Z79.01

## 2021-03-23 DIAGNOSIS — I739 Peripheral vascular disease, unspecified: Secondary | ICD-10-CM | POA: Diagnosis not present

## 2021-03-23 DIAGNOSIS — E1142 Type 2 diabetes mellitus with diabetic polyneuropathy: Secondary | ICD-10-CM | POA: Diagnosis not present

## 2021-03-23 DIAGNOSIS — L603 Nail dystrophy: Secondary | ICD-10-CM | POA: Diagnosis not present

## 2021-03-23 DIAGNOSIS — L84 Corns and callosities: Secondary | ICD-10-CM | POA: Diagnosis not present

## 2021-03-28 DIAGNOSIS — R413 Other amnesia: Secondary | ICD-10-CM | POA: Diagnosis not present

## 2021-03-28 DIAGNOSIS — R339 Retention of urine, unspecified: Secondary | ICD-10-CM | POA: Diagnosis not present

## 2021-03-28 DIAGNOSIS — N952 Postmenopausal atrophic vaginitis: Secondary | ICD-10-CM | POA: Diagnosis not present

## 2021-03-28 DIAGNOSIS — N3281 Overactive bladder: Secondary | ICD-10-CM | POA: Diagnosis not present

## 2021-04-13 ENCOUNTER — Encounter: Payer: Self-pay | Admitting: Cardiology

## 2021-04-13 ENCOUNTER — Ambulatory Visit (INDEPENDENT_AMBULATORY_CARE_PROVIDER_SITE_OTHER): Payer: Medicare Other | Admitting: Cardiology

## 2021-04-13 ENCOUNTER — Other Ambulatory Visit: Payer: Self-pay

## 2021-04-13 VITALS — BP 150/66 | HR 71 | Ht 66.0 in | Wt 170.0 lb

## 2021-04-13 DIAGNOSIS — E782 Mixed hyperlipidemia: Secondary | ICD-10-CM

## 2021-04-13 DIAGNOSIS — Z950 Presence of cardiac pacemaker: Secondary | ICD-10-CM | POA: Diagnosis not present

## 2021-04-13 DIAGNOSIS — I495 Sick sinus syndrome: Secondary | ICD-10-CM

## 2021-04-13 DIAGNOSIS — I10 Essential (primary) hypertension: Secondary | ICD-10-CM | POA: Diagnosis not present

## 2021-04-13 DIAGNOSIS — I480 Paroxysmal atrial fibrillation: Secondary | ICD-10-CM

## 2021-04-13 HISTORY — DX: Paroxysmal atrial fibrillation: I48.0

## 2021-04-13 HISTORY — DX: Sick sinus syndrome: I49.5

## 2021-04-13 NOTE — Patient Instructions (Signed)
Medication Instructions:  ?Your physician recommends that you continue on your current medications as directed. Please refer to the Current Medication list given to you today. ? ?*If you need a refill on your cardiac medications before your next appointment, please call your pharmacy* ? ? ?Lab Work: ?None ordered ?If you have labs (blood work) drawn today and your tests are completely normal, you will receive your results only by: ?MyChart Message (if you have MyChart) OR ?A paper copy in the mail ?If you have any lab test that is abnormal or we need to change your treatment, we will call you to review the results. ? ? ?Testing/Procedures: ?Your physician has requested that you have an echocardiogram. Echocardiography is a painless test that uses sound waves to create images of your heart. It provides your doctor with information about the size and shape of your heart and how well your heart?s chambers and valves are working. This procedure takes approximately one hour. There are no restrictions for this procedure. ? ? ? ?Follow-Up: ?At [facility], you and your health needs are our priority.  As part of our continuing mission to provide you with exceptional heart care, we have created designated Provider Care Teams.  These Care Teams include your primary Cardiologist (physician) and Advanced Practice Providers (APPs -  Physician Assistants and Nurse Practitioners) who all work together to provide you with the care you need, when you need it. ? ?We recommend signing up for the patient portal called "MyChart".  Sign up information is provided on this After Visit Summary.  MyChart is used to connect with patients for Virtual Visits (Telemedicine).  Patients are able to view lab/test results, encounter notes, upcoming appointments, etc.  Non-urgent messages can be sent to your provider as well.   ?To learn more about what you can do with MyChart, go to [email or web address].   ? ?Your next appointment:   ?9  month(s) ? ?The format for your next appointment:   ?In Person ? ?Provider:   ?Rajan Revankar, MD ? ? ?Other Instructions ?Echocardiogram ?An echocardiogram is a test that uses sound waves (ultrasound) to produce images of the heart. ?Images from an echocardiogram can provide important information about: ?Heart size and shape. ?The size and thickness and movement of your heart's walls. ?Heart muscle function and strength. ?Heart valve function or if you have stenosis. Stenosis is when the heart valves are too narrow. ?If blood is flowing backward through the heart valves (regurgitation). ?A tumor or infectious growth around the heart valves. ?Areas of heart muscle that are not working well because of poor blood flow or injury from a heart attack. ?Aneurysm detection. An aneurysm is a weak or damaged part of an artery wall. The wall bulges out from the normal force of blood pumping through the body. ?Tell a health care provider about: ?Any allergies you have. ?All medicines you are taking, including vitamins, herbs, eye drops, creams, and over-the-counter medicines. ?Any blood disorders you have. ?Any surgeries you have had. ?Any medical conditions you have. ?Whether you are pregnant or may be pregnant. ?What are the risks? ?Generally, this is a safe test. However, problems may occur, including an allergic reaction to dye (contrast) that may be used during the test. ?What happens before the test? ?No specific preparation is needed. You may eat and drink normally. ?What happens during the test? ?You will take off your clothes from the waist up and put on a hospital gown. ?Electrodes or electrocardiogram (ECG)patches may be placed on   your chest. The electrodes or patches are then connected to a device that monitors your heart rate and rhythm. ?You will lie down on a table for an ultrasound exam. A gel will be applied to your chest to help sound waves pass through your skin. ?A handheld device, called a transducer, will  be pressed against your chest and moved over your heart. The transducer produces sound waves that travel to your heart and bounce back (or "echo" back) to the transducer. These sound waves will be captured in real-time and changed into images of your heart that can be viewed on a video monitor. The images will be recorded on a computer and reviewed by your health care provider. ?You may be asked to change positions or hold your breath for a short time. This makes it easier to get different views or better views of your heart. ?In some cases, you may receive contrast through an IV in one of your veins. This can improve the quality of the pictures from your heart. ?The procedure may vary among health care providers and hospitals.   ?What can I expect after the test? ?You may return to your normal, everyday life, including diet, activities, and medicines, unless your health care provider tells you not to do that. ?Follow these instructions at home: ?It is up to you to get the results of your test. Ask your health care provider, or the department that is doing the test, when your results will be ready. ?Keep all follow-up visits. This is important. ?Summary ?An echocardiogram is a test that uses sound waves (ultrasound) to produce images of the heart. ?Images from an echocardiogram can provide important information about the size and shape of your heart, heart muscle function, heart valve function, and other possible heart problems. ?You do not need to do anything to prepare before this test. You may eat and drink normally. ?After the echocardiogram is completed, you may return to your normal, everyday life, unless your health care provider tells you not to do that. ?This information is not intended to replace advice given to you by your health care provider. Make sure you discuss any questions you have with your health care provider. ?Document Revised: 12/22/2019 Document Reviewed: 12/22/2019 ?Elsevier Patient  Education ? 2021 [employer] ? ? ?

## 2021-04-13 NOTE — Progress Notes (Signed)
Cardiology Office Note:    Date:  04/13/2021   ID:  Sabrina Hunt, DOB 1933/04/21, MRN [id number]  PCP:  Street, [place] Mt, MD  Cardiologist:  Jenean Lindau, MD   Referring MD: [address] [place] Mt, *    ASSESSMENT:    1. Benign essential hypertension   2. Mixed hyperlipidemia   3. Cardiac pacemaker in situ   4. Paroxysmal atrial fibrillation (HCC)   5. Tachycardia-bradycardia syndrome ([place])    PLAN:    In order of problems listed above:  Primary prevention stressed with patient.  Importance of compliance with diet medication stressed and she vocalized understanding. Paroxysmal atrial fibrillation:I discussed with the patient atrial fibrillation, disease process. Management and therapy including rate and rhythm control, anticoagulation benefits and potential risks were discussed extensively with the patient. Patient had multiple questions which were answered to patient's satisfaction. Essential hypertension: Blood pressure stable and diet was emphasized.  Lifestyle modification urged.  She has an element of whitecoat hypertension.  Her blood pressures at home are fine. Amiodarone therapy: Benefits and potential is explained and she vocalized understanding.  I reviewed blood work from primary care.  Primary care records were reviewed extensively Post permanent pacemaker: We will have her established with our electrophysiology colleagues for follow-up of this. Mixed dyslipidemia, diabetes mellitus: Diet emphasized.  Importance of regular exercise stressed and she promises to do so. Cardiac murmur and cardiomyopathy: Echocardiogram will be done to assess this.  Her ejection fraction is in the 40 to 45% range.  Previous records reviewed.   Medication Adjustments/Labs and Tests Ordered: Current medicines are reviewed at length with the patient today.  Concerns regarding medicines are outlined above.  No orders of the defined types were placed in this encounter.  No orders of  the defined types were placed in this encounter.    History of Present Illness:    Sabrina Hunt is a 87 y.o. female who is being seen today for the evaluation of paroxysmal atrial fibrillation and permanent pacemaker at the request of [address] [place], *.  Patient is a pleasant 87 year old female with past medical history of essential hypertension, dyslipidemia and diabetes mellitus tachybradycardia syndrome.  She has paroxysmal atrial fibrillation.  She has had a pacemaker and is on amiodarone therapy.  At the time of my evaluation, the patient is alert awake oriented and in no distress.  She wishes to be evaluated and established with primary care.  She is asymptomatic at this time and takes care of activities of daily living. Past Medical History:  Diagnosis Date   Acquired thrombophilia ([place])    Anemia    Arthritis    Benign essential hypertension    Bilateral hand pain 01/17/2016   Cardiac pacemaker in situ    Chronic kidney disease, stage 3a ([place])    Diabetes mellitus 08/25/2011   Diabetic peripheral neuropathy ([place]) 08/25/2011   Dyslipidemia    Fall 04/08/2019   HLD (hyperlipidemia) 08/25/2011   HTN (hypertension) 08/25/2011   Hypothyroidism    Left ankle injury    Swelling   Left foot pain 02/21/2015   Lumbar spondylosis    Multilevel degenerative disc disease    Neuromuscular disorder ([place])    Normocytic anemia 08/25/2011   Osteoarthritis of both knees 08/25/2011   S/p bilateral replacement, last 08/20/11   Overactive bladder    Paroxysmal cardiac arrhythmia    RBBB (right bundle branch block)    Reflux gastritis    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  Secondary neuropathy ([place])    Senile osteopenia    Shortness of breath 08/25/2011   Shoulder joint pain 08/14/2012   Subdural hematoma 04/08/2019   Type 2 diabetes mellitus with hyperlipidemia [facility])     Past Surgical History:  Procedure Laterality Date   ABDOMINAL HYSTERECTOMY     APPENDECTOMY     CARPAL  TUNNEL RELEASE     Left   Colonscopy  2004   JOINT REPLACEMENT  11/2008   Left Knee Replacement   ROTATOR CUFF REPAIR     Right   TONSILLECTOMY     TOTAL KNEE ARTHROPLASTY  08/20/2011   Procedure: TOTAL KNEE ARTHROPLASTY;  Surgeon: Rudean Haskell, MD;  Location: [place];  Service: Orthopedics;  Laterality: Right;    Current Medications: Current Meds  Medication Sig   acetaminophen (TYLENOL) 500 MG tablet Take 500 mg by mouth every 6 (six) hours as needed. FOR PAIN   amiodarone (PACERONE) 200 MG tablet Take 100 mg by mouth daily.   amLODipine (NORVASC) 5 MG tablet Take 5 mg by mouth daily.   Ascorbic Acid (VITAMIN C PO) Take 1 tablet by mouth daily.   atorvastatin (LIPITOR) 20 MG tablet Take 20 mg by mouth daily.   Calcium Carbonate-Vitamin D (CALTRATE 600+D PO) Take 1 tablet by mouth daily.   Calcium-Vitamin D (CALTRATE 600 PLUS-VIT D PO) Take 1 tablet by mouth 2 (two) times daily.   Cholecalciferol (VITAMIN D-3 PO) Take 1 capsule by mouth daily.   donepezil (ARICEPT) 5 MG tablet Take 5 mg by mouth at bedtime.   ELIQUIS 5 MG TABS tablet Take 5 mg by mouth 2 (two) times daily.   enoxaparin (LOVENOX) 40 MG/0.4ML injection Inject 40 mg into the skin daily.   EUTHYROX 25 MCG tablet Take 25 mcg by mouth daily.   gabapentin (NEURONTIN) 100 MG capsule Take 100 mg by mouth at bedtime.   losartan (COZAAR) 25 MG tablet Take 25 mg by mouth daily.   methocarbamol (ROBAXIN) 500 MG tablet Take 500 mg by mouth every 6 (six) hours as needed. For muscle spasms   metoprolol tartrate (LOPRESSOR) 25 MG tablet Take 25 mg by mouth 2 (two) times daily.   Multiple Vitamin (MULITIVITAMIN WITH MINERALS) TABS Take 1 tablet by mouth daily.   potassium chloride SA (KLOR-CON) 20 MEQ tablet Take 20 mEq by mouth as needed. To be taken when takes torsemide   sitaGLIPtin (JANUVIA) 100 MG tablet Take 50 mg by mouth daily.   torsemide (DEMADEX) 5 MG tablet Take 5 mg by mouth as needed for edema.   Vibegron 75 MG TABS  Take 75 mg by mouth daily.     Allergies:   Latex, Ace inhibitors, Amoxicillin, Hydrocodone, and Prednisone   Social History   Socioeconomic History   Marital status: Married    Spouse name: Not on file   Number of children: Not on file   Years of education: Not on file   Highest education level: Not on file  Occupational History   Not on file  Tobacco Use   Smoking status: Never   Smokeless tobacco: Never  Substance and Sexual Activity   Alcohol use: No   Drug use: No   Sexual activity: Not on file  Other Topics Concern   Not on file  Social History Narrative   Married for 36 yrs, 2 kids, 2 grandkids.  Previously worked in a [employer], then in [employer], retired 15 yrs ago.  Graduated 12th grade, has Medicare and [employer]  health insurance.  Previously enjoyed Diplomatic Services operational officer games" (shuffleboard, etc.) until her knee problems.   Social Determinants of Health   Financial Resource Strain: Not on file  Food Insecurity: Not on file  Transportation Needs: Not on file  Physical Activity: Not on file  Stress: Not on file  Social Connections: Not on file     Family History: The patient's family history includes CAD in her mother. There is no history of Anesthesia problems.  ROS:   Please see the history of present illness.    All other systems reviewed and are negative.  EKGs/Labs/Other Studies Reviewed:    The following studies were reviewed today: EKG reveals paced rhythm atrial and ventricular.   Recent Labs: No results found for requested labs within last 8760 hours.  Recent Lipid Panel No results found for: CHOL, TRIG, HDL, CHOLHDL, VLDL, LDLCALC, LDLDIRECT  Physical Exam:    VS:  BP (!) 150/66   Pulse 71   Ht 5\' 6"  (1.676 m)   Wt 170 lb (77.1 kg)   SpO2 98%   BMI 27.44 kg/m     Wt Readings from Last 3 Encounters:  04/13/21 170 lb (77.1 kg)  08/25/11 188 lb 15 oz (85.7 kg)  08/20/11 193 lb (87.5 kg)     GEN: Patient is in no acute distress HEENT:  Normal NECK: No JVD; No carotid bruits LYMPHATICS: No lymphadenopathy CARDIAC: S1 S2 regular, 2/6 systolic murmur at the apex. RESPIRATORY:  Clear to auscultation without rales, wheezing or rhonchi  ABDOMEN: Soft, non-tender, non-distended MUSCULOSKELETAL:  No edema; No deformity  SKIN: Warm and dry NEUROLOGIC:  Alert and oriented x 3 PSYCHIATRIC:  Normal affect    Signed, Jenean Lindau, MD  04/13/2021 2:14 PM    [facility]

## 2021-04-18 DIAGNOSIS — Z950 Presence of cardiac pacemaker: Secondary | ICD-10-CM | POA: Diagnosis not present

## 2021-04-18 DIAGNOSIS — Z45018 Encounter for adjustment and management of other part of cardiac pacemaker: Secondary | ICD-10-CM | POA: Diagnosis not present

## 2021-04-18 DIAGNOSIS — R001 Bradycardia, unspecified: Secondary | ICD-10-CM | POA: Diagnosis not present

## 2021-04-18 DIAGNOSIS — I8310 Varicose veins of unspecified lower extremity with inflammation: Secondary | ICD-10-CM | POA: Diagnosis not present

## 2021-04-18 DIAGNOSIS — R339 Retention of urine, unspecified: Secondary | ICD-10-CM | POA: Diagnosis not present

## 2021-04-18 DIAGNOSIS — R600 Localized edema: Secondary | ICD-10-CM | POA: Diagnosis not present

## 2021-04-18 DIAGNOSIS — L570 Actinic keratosis: Secondary | ICD-10-CM | POA: Diagnosis not present

## 2021-04-29 DIAGNOSIS — Z23 Encounter for immunization: Secondary | ICD-10-CM | POA: Diagnosis not present

## 2021-05-12 DIAGNOSIS — M25551 Pain in right hip: Secondary | ICD-10-CM | POA: Diagnosis not present

## 2021-05-12 DIAGNOSIS — Z5321 Procedure and treatment not carried out due to patient leaving prior to being seen by health care provider: Secondary | ICD-10-CM | POA: Diagnosis not present

## 2021-05-17 DIAGNOSIS — Z6827 Body mass index (BMI) 27.0-27.9, adult: Secondary | ICD-10-CM | POA: Diagnosis not present

## 2021-05-17 DIAGNOSIS — S8001XA Contusion of right knee, initial encounter: Secondary | ICD-10-CM | POA: Diagnosis not present

## 2021-05-17 DIAGNOSIS — M539 Dorsopathy, unspecified: Secondary | ICD-10-CM | POA: Diagnosis not present

## 2021-05-17 DIAGNOSIS — S4991XA Unspecified injury of right shoulder and upper arm, initial encounter: Secondary | ICD-10-CM | POA: Diagnosis not present

## 2021-05-25 DIAGNOSIS — E1151 Type 2 diabetes mellitus with diabetic peripheral angiopathy without gangrene: Secondary | ICD-10-CM | POA: Diagnosis not present

## 2021-05-25 DIAGNOSIS — I739 Peripheral vascular disease, unspecified: Secondary | ICD-10-CM | POA: Diagnosis not present

## 2021-05-25 DIAGNOSIS — M19011 Primary osteoarthritis, right shoulder: Secondary | ICD-10-CM | POA: Diagnosis not present

## 2021-05-25 DIAGNOSIS — M7061 Trochanteric bursitis, right hip: Secondary | ICD-10-CM | POA: Diagnosis not present

## 2021-05-25 DIAGNOSIS — L603 Nail dystrophy: Secondary | ICD-10-CM | POA: Diagnosis not present

## 2021-06-19 ENCOUNTER — Encounter: Payer: Medicare Other | Admitting: Cardiology

## 2021-06-20 DIAGNOSIS — E1169 Type 2 diabetes mellitus with other specified complication: Secondary | ICD-10-CM | POA: Diagnosis not present

## 2021-06-20 DIAGNOSIS — E785 Hyperlipidemia, unspecified: Secondary | ICD-10-CM | POA: Diagnosis not present

## 2021-06-20 DIAGNOSIS — G63 Polyneuropathy in diseases classified elsewhere: Secondary | ICD-10-CM | POA: Diagnosis not present

## 2021-06-20 DIAGNOSIS — R4181 Age-related cognitive decline: Secondary | ICD-10-CM | POA: Diagnosis not present

## 2021-06-20 DIAGNOSIS — E039 Hypothyroidism, unspecified: Secondary | ICD-10-CM | POA: Diagnosis not present

## 2021-06-21 DIAGNOSIS — R4181 Age-related cognitive decline: Secondary | ICD-10-CM | POA: Diagnosis not present

## 2021-06-21 DIAGNOSIS — R319 Hematuria, unspecified: Secondary | ICD-10-CM | POA: Diagnosis not present

## 2021-07-14 DIAGNOSIS — R413 Other amnesia: Secondary | ICD-10-CM | POA: Diagnosis not present

## 2021-07-14 DIAGNOSIS — N3281 Overactive bladder: Secondary | ICD-10-CM | POA: Diagnosis not present

## 2021-07-14 DIAGNOSIS — N952 Postmenopausal atrophic vaginitis: Secondary | ICD-10-CM | POA: Diagnosis not present

## 2021-07-14 DIAGNOSIS — R339 Retention of urine, unspecified: Secondary | ICD-10-CM | POA: Diagnosis not present

## 2021-07-18 DIAGNOSIS — Z950 Presence of cardiac pacemaker: Secondary | ICD-10-CM | POA: Diagnosis not present

## 2021-07-18 DIAGNOSIS — R001 Bradycardia, unspecified: Secondary | ICD-10-CM | POA: Diagnosis not present

## 2021-07-22 DIAGNOSIS — Z45018 Encounter for adjustment and management of other part of cardiac pacemaker: Secondary | ICD-10-CM | POA: Diagnosis not present

## 2021-08-07 ENCOUNTER — Ambulatory Visit (INDEPENDENT_AMBULATORY_CARE_PROVIDER_SITE_OTHER): Payer: Medicare HMO | Admitting: Cardiology

## 2021-08-07 ENCOUNTER — Other Ambulatory Visit: Payer: Self-pay

## 2021-08-07 ENCOUNTER — Encounter: Payer: Self-pay | Admitting: Cardiology

## 2021-08-07 VITALS — BP 140/68 | HR 92 | Ht 67.0 in | Wt 168.4 lb

## 2021-08-07 DIAGNOSIS — Z79899 Other long term (current) drug therapy: Secondary | ICD-10-CM | POA: Diagnosis not present

## 2021-08-07 DIAGNOSIS — I495 Sick sinus syndrome: Secondary | ICD-10-CM | POA: Diagnosis not present

## 2021-08-07 DIAGNOSIS — I480 Paroxysmal atrial fibrillation: Secondary | ICD-10-CM

## 2021-08-07 DIAGNOSIS — N289 Disorder of kidney and ureter, unspecified: Secondary | ICD-10-CM | POA: Diagnosis not present

## 2021-08-07 DIAGNOSIS — R0602 Shortness of breath: Secondary | ICD-10-CM

## 2021-08-07 DIAGNOSIS — D6869 Other thrombophilia: Secondary | ICD-10-CM

## 2021-08-07 LAB — CUP PACEART INCLINIC DEVICE CHECK
Battery Remaining Longevity: 124 mo
Battery Voltage: 3.02 V
Brady Statistic AP VP Percent: 28.37 %
Brady Statistic AP VS Percent: 70.47 %
Brady Statistic AS VP Percent: 0.05 %
Brady Statistic AS VS Percent: 1.1 %
Brady Statistic RA Percent Paced: 98.9 %
Brady Statistic RV Percent Paced: 28.43 %
Date Time Interrogation Session: 20230327152110
Implantable Lead Implant Date: 20201124
Implantable Lead Implant Date: 20201124
Implantable Lead Location: 753859
Implantable Lead Location: 753860
Implantable Lead Model: 5076
Implantable Lead Model: 5076
Implantable Pulse Generator Implant Date: 20201124
Lead Channel Impedance Value: 342 Ohm
Lead Channel Impedance Value: 380 Ohm
Lead Channel Impedance Value: 380 Ohm
Lead Channel Impedance Value: 456 Ohm
Lead Channel Pacing Threshold Amplitude: 0.5 V
Lead Channel Pacing Threshold Amplitude: 0.75 V
Lead Channel Pacing Threshold Pulse Width: 0.4 ms
Lead Channel Pacing Threshold Pulse Width: 0.4 ms
Lead Channel Sensing Intrinsic Amplitude: 3 mV
Lead Channel Sensing Intrinsic Amplitude: 3 mV
Lead Channel Sensing Intrinsic Amplitude: 4.5 mV
Lead Channel Sensing Intrinsic Amplitude: 5.5 mV
Lead Channel Setting Pacing Amplitude: 1.5 V
Lead Channel Setting Pacing Amplitude: 2 V
Lead Channel Setting Pacing Pulse Width: 0.4 ms
Lead Channel Setting Sensing Sensitivity: 1.2 mV

## 2021-08-07 NOTE — Patient Instructions (Signed)
Medication Instructions:  ?Your physician recommends that you continue on your current medications as directed. Please refer to the Current Medication list given to you today. ? ?*If you need a refill on your cardiac medications before your next appointment, please call your pharmacy* ? ? ?Lab Work: ?Today: CMET, TSH & BNP ?If you have labs (blood work) drawn today and your tests are completely normal, you will receive your results only by: ?MyChart Message (if you have MyChart) OR ?A paper copy in the mail ?If you have any lab test that is abnormal or we need to change your treatment, we will call you to review the results. ? ? ?Testing/Procedures: ?None ordered ? ? ?Follow-Up: ?At [facility], you and your health needs are our priority.  As part of our continuing mission to provide you with exceptional heart care, we have created designated Provider Care Teams.  These Care Teams include your primary Cardiologist (physician) and Advanced Practice Providers (APPs -  Physician Assistants and Nurse Practitioners) who all work together to provide you with the care you need, when you need it. ? ?Remote monitoring is used to monitor your Pacemaker or ICD from home. This monitoring reduces the number of office visits required to check your device to one time per year. It allows [place] to keep an eye on the functioning of your device to ensure it is working properly. You are scheduled for a device check from home on 11/06/2021. You may send your transmission at any time that day. If you have a wireless device, the transmission will be sent automatically. After your physician reviews your transmission, you will receive a postcard with your next transmission date. ? ?Your next appointment:   ?1 year(s) ? ?The format for your next appointment:   ?In Person ? ?Provider:   ?Allegra Lai, MD ? ? ?Thank you for choosing [facility]!! ? ? ?Trinidad Curet, RN ?[phone] ? ? ? ?Other Instructions ?  ?

## 2021-08-07 NOTE — Progress Notes (Signed)
? ?Electrophysiology Office Note ? ? ?Date:  08/07/2021  ? ?ID:  Sabrina Hunt, DOB 1932-08-08, MRN [id number] ? ?PCP:  Street, Sharon Mt, MD  ?Cardiologist:  Revankar ?Primary Electrophysiologist:  Alece Koppel Meredith Leeds, MD   ? ?Chief Complaint: pacemaker ?  ?History of Present Illness: ?Sabrina Hunt is a 88 y.o. female who is being seen today for the evaluation of pacemaker at the request of Revankar, Reita Cliche, MD. Presenting today for electrophysiology evaluation. ? ?She has a history significant for hypertension, CKD stage IIIa, diabetes, hypertension, hyperlipidemia, paroxysmal atrial fibrillation.  She is status post Tronic dual-chamber pacemaker implanted for tachybradycardia syndrome. ? ?Today, she denies symptoms of palpitations, chest pain, orthopnea, PND,  claudication, dizziness, presyncope, syncope, bleeding, or neurologic sequela. The patient is tolerating medications without difficulties.  She presents today complaining of shortness of breath and lower extremity edema.  She has been having this for a few months.  She is short of breath when she walks short distances, but can do her daily activities.  Her legs are somewhat sore due to her edema. ? ? ?Past Medical History:  ?Diagnosis Date  ? Acquired thrombophilia ([place])   ? Anemia   ? Arthritis   ? Benign essential hypertension   ? Bilateral hand pain 01/17/2016  ? Cardiac pacemaker in situ   ? Chronic kidney disease, stage 3a ([place])   ? Diabetes mellitus 08/25/2011  ? Diabetic peripheral neuropathy ([place]) 08/25/2011  ? Dyslipidemia   ? Fall 04/08/2019  ? HLD (hyperlipidemia) 08/25/2011  ? HTN (hypertension) 08/25/2011  ? Hypothyroidism   ? Left ankle injury   ? Swelling  ? Left foot pain 02/21/2015  ? Lumbar spondylosis   ? Multilevel degenerative disc disease   ? Neuromuscular disorder ([place])   ? Normocytic anemia 08/25/2011  ? Osteoarthritis of both knees 08/25/2011  ? S/p bilateral replacement, last 08/20/11  ? Overactive bladder   ? Paroxysmal cardiac  arrhythmia   ? RBBB (right bundle branch block)   ? Reflux gastritis   ? 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  ? Secondary neuropathy ([place])   ? Senile osteopenia   ? Shortness of breath 08/25/2011  ? Shoulder joint pain 08/14/2012  ? Subdural hematoma 04/08/2019  ? Type 2 diabetes mellitus with hyperlipidemia ([place])   ? ?Past Surgical History:  ?Procedure Laterality Date  ? ABDOMINAL HYSTERECTOMY    ? APPENDECTOMY    ? CARPAL TUNNEL RELEASE    ? Left  ? Colonscopy  2004  ? JOINT REPLACEMENT  11/2008  ? Left Knee Replacement  ? ROTATOR CUFF REPAIR    ? Right  ? TONSILLECTOMY    ? TOTAL KNEE ARTHROPLASTY  08/20/2011  ? Procedure: TOTAL KNEE ARTHROPLASTY;  Surgeon: Rudean Haskell, MD;  Location: [place];  Service: Orthopedics;  Laterality: Right;  ? ? ? ?Current Outpatient Medications  ?Medication Sig Dispense Refill  ? acetaminophen (TYLENOL) 500 MG tablet Take 500 mg by mouth every 6 (six) hours as needed. FOR PAIN    ? amiodarone (PACERONE) 200 MG tablet Take 100 mg by mouth daily.    ? amLODipine (NORVASC) 5 MG tablet Take 5 mg by mouth daily.    ? Ascorbic Acid (VITAMIN C PO) Take 1 tablet by mouth daily.    ? atorvastatin (LIPITOR) 20 MG tablet Take 20 mg by mouth daily.    ? Calcium Carbonate-Vitamin D (CALTRATE 600+D PO) Take 1 tablet by mouth daily.    ? Calcium-Vitamin D (CALTRATE 600  PLUS-VIT D PO) Take 1 tablet by mouth 2 (two) times daily.    ? Cholecalciferol (VITAMIN D-3 PO) Take 1 capsule by mouth daily.    ? donepezil (ARICEPT) 5 MG tablet Take 5 mg by mouth at bedtime.    ? ELIQUIS 5 MG TABS tablet Take 5 mg by mouth 2 (two) times daily.    ? enoxaparin (LOVENOX) 40 MG/0.4ML injection Inject 40 mg into the skin daily.    ? EUTHYROX 25 MCG tablet Take 25 mcg by mouth daily.    ? gabapentin (NEURONTIN) 100 MG capsule Take 100 mg by mouth at bedtime.    ? losartan (COZAAR) 25 MG tablet Take 25 mg by mouth daily.    ? methocarbamol (ROBAXIN) 500 MG tablet Take 500 mg by mouth every 6 (six) hours as needed.  For muscle spasms    ? metoprolol tartrate (LOPRESSOR) 25 MG tablet Take 25 mg by mouth 2 (two) times daily.    ? Multiple Vitamin (MULITIVITAMIN WITH MINERALS) TABS Take 1 tablet by mouth daily.    ? potassium chloride SA (KLOR-CON) 20 MEQ tablet Take 20 mEq by mouth as needed. To be taken when takes torsemide    ? sitaGLIPtin (JANUVIA) 100 MG tablet Take 50 mg by mouth daily.    ? torsemide (DEMADEX) 5 MG tablet Take 5 mg by mouth as needed for edema.    ? Vibegron 75 MG TABS Take 75 mg by mouth daily.    ? ?No current facility-administered medications for this visit.  ? ? ?Allergies:   Latex, Ace inhibitors, Amoxicillin, Hydrocodone, and Prednisone  ? ?Social History:  The patient  reports that she has never smoked. She has never used smokeless tobacco. She reports that she does not drink alcohol and does not use drugs.  ? ?Family History:  The patient's family history includes CAD in her mother.  ? ? ?ROS:  Please see the history of present illness.   Otherwise, review of systems is positive for none.   All other systems are reviewed and negative.  ? ? ?PHYSICAL EXAM: ?VS:  BP 140/68   Pulse 92   Ht _0  (1.702 m)   Wt 168 lb 6.4 oz (76.4 kg)   SpO2 97%   BMI 26.38 kg/m?  , BMI Body mass index is 26.38 kg/m?. ?GEN: Well nourished, well developed, in no acute distress  ?HEENT: normal  ?Neck: no JVD, carotid bruits, or masses ?Cardiac: RRR; no murmurs, rubs, or gallops, 2+ edema  ?Respiratory:  clear to auscultation bilaterally, normal work of breathing ?GI: soft, nontender, nondistended, + BS ?MS: no deformity or atrophy  ?Skin: warm and dry, device pocket is well healed ?Neuro:  Strength and sensation are intact ?Psych: euthymic mood, full affect ? ?EKG:  EKG is not ordered today. ?Personal review of the ekg ordered 04/13/21 shows paced, right bundle branch block, left anterior fascicular block ? ?Device interrogation is reviewed today in detail.  See PaceArt for details. ? ? ?Recent Labs: ?No results found  for requested labs within last 8760 hours.  ? ? ?Lipid Panel  ?No results found for: CHOL, TRIG, HDL, CHOLHDL, VLDL, LDLCALC, LDLDIRECT ? ? ?Wt Readings from Last 3 Encounters:  ?08/07/21 168 lb 6.4 oz (76.4 kg)  ?04/13/21 170 lb (77.1 kg)  ?08/25/11 188 lb 15 oz (85.7 kg)  ?  ? ? ?Other studies Reviewed: ?Additional studies/ records that were reviewed today include: epic notes ? ?ASSESSMENT AND PLAN: ? ?1.  Tachybradycardia syndrome:  Status post Medtronic dual-chamber pacemaker implanted in 2020.  Device functioning appropriately.  We Fenna Semel arrange for device follow-up in clinic. ? ?2.  Paroxysmal atrial fibrillation: Currently on amiodarone 100 mg daily, Eliquis 5 mg twice daily.  CHA2DS2-VASc of at least 5.  She has remained in sinus rhythm.  We Shanon Seawright continue with current management.  High risk medication monitoring for amiodarone.  We Lateefah Mallery check amiodarone labs, CMP and TSH today. ? ?3.  Hypertension: Mildly elevated today.  Usually well controlled.  No changes. ? ?4.  Shortness of breath: Patient has significant lower extremity edema.  Unfortunately I do not have access to a transthoracic echo to ensure that her ejection fraction is normal.  We Nioka Thorington check a BNP and if significantly elevated, she Kadia Abaya likely need an echo.  We Roselin Wiemann also plan for diuresis once her creatinine has been established as a baseline. ? ?5.  Secondary hypercoagulable state: Continue Eliquis for atrial fibrillation as above. ? ?Current medicines are reviewed at length with the patient today.   ?The patient does not have concerns regarding her medicines.  The following changes were made today:  none ? ?Labs/ tests ordered today include:  ?Orders Placed This Encounter  ?Procedures  ? Comp Met (CMET)  ? TSH  ? Pro b natriuretic peptide (BNP)  ? ? ? ?Disposition:   FU with Larrisha Babineau 1 year ? ?Signed, ?Sharone Almond Meredith Leeds, MD  ?08/07/2021 12:19 PM    ? ?[facility] ?[address] ?[address] ?[place] [address] ?[phone]  (office) ?[phone] (fax) ? ?

## 2021-08-08 LAB — COMPREHENSIVE METABOLIC PANEL
ALT: 22 IU/L (ref 0–32)
AST: 33 IU/L (ref 0–40)
Albumin/Globulin Ratio: 1.6 (ref 1.2–2.2)
Albumin: 4.3 g/dL (ref 3.6–4.6)
Alkaline Phosphatase: 64 IU/L (ref 44–121)
BUN/Creatinine Ratio: 13 (ref 12–28)
BUN: 20 mg/dL (ref 8–27)
Bilirubin Total: 0.5 mg/dL (ref 0.0–1.2)
CO2: 24 mmol/L (ref 20–29)
Calcium: 9.6 mg/dL (ref 8.7–10.3)
Chloride: 97 mmol/L (ref 96–106)
Creatinine, Ser: 1.52 mg/dL — ABNORMAL HIGH (ref 0.57–1.00)
Globulin, Total: 2.7 g/dL (ref 1.5–4.5)
Glucose: 134 mg/dL — ABNORMAL HIGH (ref 70–99)
Potassium: 4.3 mmol/L (ref 3.5–5.2)
Sodium: 136 mmol/L (ref 134–144)
Total Protein: 7 g/dL (ref 6.0–8.5)
eGFR: 33 mL/min/{1.73_m2} — ABNORMAL LOW (ref >59)

## 2021-08-08 LAB — PRO B NATRIURETIC PEPTIDE: NT-Pro BNP: 982 pg/mL — ABNORMAL HIGH (ref 0–738)

## 2021-08-08 LAB — TSH: TSH: 1.22 u[IU]/mL (ref 0.450–4.500)

## 2021-08-08 NOTE — Addendum Note (Signed)
Addended by: Truddie Hidden on: 08/08/2021 04:47 PM ? ? Modules accepted: Orders ? ?

## 2021-08-10 DIAGNOSIS — E1151 Type 2 diabetes mellitus with diabetic peripheral angiopathy without gangrene: Secondary | ICD-10-CM | POA: Diagnosis not present

## 2021-08-10 DIAGNOSIS — L603 Nail dystrophy: Secondary | ICD-10-CM | POA: Diagnosis not present

## 2021-08-10 DIAGNOSIS — I739 Peripheral vascular disease, unspecified: Secondary | ICD-10-CM | POA: Diagnosis not present

## 2021-08-16 ENCOUNTER — Ambulatory Visit: Payer: Medicare HMO

## 2021-08-16 ENCOUNTER — Telehealth: Payer: Self-pay

## 2021-08-16 VITALS — BP 114/60 | HR 72 | Ht 66.6 in | Wt 166.8 lb

## 2021-08-16 DIAGNOSIS — I498 Other specified cardiac arrhythmias: Secondary | ICD-10-CM | POA: Diagnosis not present

## 2021-08-16 DIAGNOSIS — R0602 Shortness of breath: Secondary | ICD-10-CM

## 2021-08-16 DIAGNOSIS — E039 Hypothyroidism, unspecified: Secondary | ICD-10-CM | POA: Diagnosis not present

## 2021-08-16 DIAGNOSIS — Z950 Presence of cardiac pacemaker: Secondary | ICD-10-CM | POA: Diagnosis not present

## 2021-08-16 DIAGNOSIS — N1831 Chronic kidney disease, stage 3a: Secondary | ICD-10-CM | POA: Diagnosis not present

## 2021-08-16 DIAGNOSIS — R4181 Age-related cognitive decline: Secondary | ICD-10-CM | POA: Diagnosis not present

## 2021-08-16 DIAGNOSIS — E1169 Type 2 diabetes mellitus with other specified complication: Secondary | ICD-10-CM | POA: Diagnosis not present

## 2021-08-16 DIAGNOSIS — D6869 Other thrombophilia: Secondary | ICD-10-CM | POA: Diagnosis not present

## 2021-08-16 DIAGNOSIS — I10 Essential (primary) hypertension: Secondary | ICD-10-CM | POA: Diagnosis not present

## 2021-08-16 DIAGNOSIS — E785 Hyperlipidemia, unspecified: Secondary | ICD-10-CM | POA: Diagnosis not present

## 2021-08-16 MED ORDER — ELIQUIS 5 MG PO TABS: 5.0000 mg | ORAL_TABLET | Freq: Two times a day (BID) | ORAL | 2 refills | Status: DC

## 2021-08-16 NOTE — Progress Notes (Signed)
? ?  Nurse Visit  ? ?Date of Encounter: 08/16/2021 ?ID: Sabrina Hunt, DOB 07-21-1932, MRN [id number] ? ?PCP:  Street, Sharon Mt, MD ?  ?[facility] Providers ?Cardiologist:  Dr. Geraldo Pitter ? ?}   ? ? ?Visit Details  ? ?VS:  BP 114/60   Pulse 72   Ht 5' 6.6" (1.692 m)   Wt 166 lb 12.8 oz (75.7 kg)   SpO2 96%   BMI 26.44 kg/m?  , BMI Body mass index is 26.44 kg/m?. ? ?Wt Readings from Last 3 Encounters:  ?08/16/21 166 lb 12.8 oz (75.7 kg)  ?08/07/21 168 lb 6.4 oz (76.4 kg)  ?04/13/21 170 lb (77.1 kg)  ?  ? ?Reason for visit: Pt needs an appt with Dr. Geraldo Pitter next week. Nurse visit for vitals, review weights and labs. Recent med change for Torsemide ?Performed today: Vitals, Provider consulted, Education ?Changes (medications, testing, etc.) : BMP ordered. Refill Eliquis ordered. Appointment made with Dr. Geraldo Pitter and Curt Bears ?Length of Visit: 25 minutes ? ? ? ?Medications Adjustments/Labs and Tests Ordered: ?Orders Placed This Encounter  ?Procedures  ? Basic Metabolic Panel (BMET)  ? ?Meds ordered this encounter  ?Medications  ? ELIQUIS 5 MG TABS tablet  ?  Sig: Take 1 tablet (5 mg total) by mouth 2 (two) times daily.  ?  Dispense:  180 tablet  ?  Refill:  2  ? ? ? ?Signed, ?Louie Casa, RN  ?08/16/2021 4:40 PM ? ?

## 2021-08-17 LAB — BASIC METABOLIC PANEL
BUN/Creatinine Ratio: 14 (ref 12–28)
BUN: 20 mg/dL (ref 8–27)
CO2: 29 mmol/L (ref 20–29)
Calcium: 9.9 mg/dL (ref 8.7–10.3)
Chloride: 99 mmol/L (ref 96–106)
Creatinine, Ser: 1.47 mg/dL — ABNORMAL HIGH (ref 0.57–1.00)
Glucose: 79 mg/dL (ref 70–99)
Potassium: 4 mmol/L (ref 3.5–5.2)
Sodium: 138 mmol/L (ref 134–144)
eGFR: 34 mL/min/{1.73_m2} — ABNORMAL LOW (ref >59)

## 2021-08-22 DIAGNOSIS — F0390 Unspecified dementia without behavioral disturbance: Secondary | ICD-10-CM | POA: Diagnosis not present

## 2021-08-22 DIAGNOSIS — K529 Noninfective gastroenteritis and colitis, unspecified: Secondary | ICD-10-CM | POA: Diagnosis not present

## 2021-08-22 DIAGNOSIS — Z6827 Body mass index (BMI) 27.0-27.9, adult: Secondary | ICD-10-CM | POA: Diagnosis not present

## 2021-08-23 ENCOUNTER — Ambulatory Visit: Payer: Medicare HMO | Admitting: Cardiology

## 2021-08-23 ENCOUNTER — Encounter: Payer: Self-pay | Admitting: Cardiology

## 2021-08-23 VITALS — BP 160/64 | HR 71 | Ht 66.6 in | Wt 167.0 lb

## 2021-08-23 DIAGNOSIS — Z7901 Long term (current) use of anticoagulants: Secondary | ICD-10-CM

## 2021-08-23 DIAGNOSIS — I10 Essential (primary) hypertension: Secondary | ICD-10-CM | POA: Diagnosis not present

## 2021-08-23 DIAGNOSIS — N1831 Chronic kidney disease, stage 3a: Secondary | ICD-10-CM | POA: Diagnosis not present

## 2021-08-23 DIAGNOSIS — Z950 Presence of cardiac pacemaker: Secondary | ICD-10-CM

## 2021-08-23 DIAGNOSIS — I480 Paroxysmal atrial fibrillation: Secondary | ICD-10-CM

## 2021-08-23 DIAGNOSIS — Z8679 Personal history of other diseases of the circulatory system: Secondary | ICD-10-CM

## 2021-08-23 DIAGNOSIS — R0609 Other forms of dyspnea: Secondary | ICD-10-CM

## 2021-08-23 HISTORY — DX: Personal history of other diseases of the circulatory system: Z86.79

## 2021-08-23 NOTE — Addendum Note (Signed)
Addended by: Truddie Hidden on: 08/23/2021 10:30 AM ? ? Modules accepted: Orders ? ?

## 2021-08-23 NOTE — Progress Notes (Signed)
?Cardiology Office Note:   ? ?Date:  08/23/2021  ? ?ID:  Sabrina Hunt, DOB 03-10-1933, MRN [id number] ? ?PCP:  Street, Sharon Mt, MD  ?Cardiologist:  Jenean Lindau, MD  ? ?Referring MD: Street, Sharon Mt, *  ? ? ?ASSESSMENT:   ? ?1. Benign essential hypertension   ?2. Paroxysmal atrial fibrillation (HCC)   ?3. Cardiac pacemaker in situ   ?4. Chronic kidney disease, stage 3a ([place])   ?5. Long term current use of anticoagulant   ?6. History of cardiomyopathy   ? ?PLAN:   ? ?In order of problems listed above: ? ?Cardiomyopathy: Medical management.  Congestive heart failure education was given to the patient at length and questions were answered to their satisfaction.  Salt intake issues were discussed and they are already aware of it.  She weighs herself on a regular basis.  Because of these issues she will have a Chem-7 and a BNP today.  We will also do an echocardiogram to see left ventricular systolic function assessment.  Echo will help guide management.  Electrophysiology colleagues notes appreciated and reviewed and discussed with the patient.  Questions were answered to the satisfaction. ?Essential hypertension: I think she has an element of whitecoat hypertension.  In view of advanced age and multiple comorbidities I would not be too aggressive in lowering this 1 sporadic elevated blood pressure reading.  The readings in the past have been unremarkable. ?Mixed dyslipidemia and diabetes mellitus: Diet was emphasized and lifestyle modification was urged. ?Paroxysmal atrial fibrillation:I discussed with the patient atrial fibrillation, disease process. Management and therapy including rate and rhythm control, anticoagulation benefits and potential risks were discussed extensively with the patient. Patient had multiple questions which were answered to patient's satisfaction..  On guideline directed medical therapy and on amiodarone and benefits and risks explained and she vocalized understanding and  questions were answered to her satisfaction. ?Permanent pacemaker: Managed by electrophysiology colleagues and notes were reviewed. ?Patient will be seen in follow-up appointment in 6 months or earlier if the patient has any concerns ? ? ? ?Medication Adjustments/Labs and Tests Ordered: ?Current medicines are reviewed at length with the patient today.  Concerns regarding medicines are outlined above.  ?No orders of the defined types were placed in this encounter. ? ?No orders of the defined types were placed in this encounter. ? ? ? ?Chief Complaint  ?Patient presents with  ? Follow-up  ?  ? ?History of Present Illness:   ? ?Sabrina Hunt is a 88 y.o. female.  Patient has past medical history of cardiomyopathy, paroxysmal atrial fibrillation, essential hypertension, dyslipidemia, diabetes mellitus and renal insufficiency.  She has issue also with dementia.  She denies any problems at this time and takes care of activities of daily living.  No chest pain orthopnea or PND.  She has some shortness of breath on exertion.  Gets better with the diuretic.  She has bilateral pedal edema which is also improved with diuretic.  At the time of my evaluation, the patient is alert awake oriented and in no distress. ? ?Past Medical History:  ?Diagnosis Date  ? Acquired thrombophilia ([place])   ? Anemia   ? Arthritis   ? Atrial flutter ([place]) 03/20/2021  ? Benign essential hypertension   ? Bilateral hand pain 01/17/2016  ? Cardiac pacemaker in situ   ? Chronic kidney disease, stage 3a ([place])   ? Diabetes mellitus 08/25/2011  ? Diabetic peripheral neuropathy ([place]) 08/25/2011  ? Dyslipidemia   ? Fall 04/08/2019  ?  HLD (hyperlipidemia) 08/25/2011  ? HTN (hypertension) 08/25/2011  ? Hypothyroidism   ? Left ankle injury   ? Swelling  ? Left foot pain 02/21/2015  ? Long term current use of anticoagulant 03/20/2021  ? Lumbar spondylosis   ? Multilevel degenerative disc disease   ? Neuromuscular disorder ([place])   ? Normocytic anemia 08/25/2011  ?  Osteoarthritis of both knees 08/25/2011  ? S/p bilateral replacement, last 08/20/11  ? Overactive bladder   ? Paroxysmal atrial fibrillation ([place]) 04/13/2021  ? Paroxysmal cardiac arrhythmia   ? RBBB (right bundle branch block)   ? Reflux gastritis   ? 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  ? Secondary neuropathy   ? Senile osteopenia   ? Shortness of breath 08/25/2011  ? Shoulder joint pain 08/14/2012  ? Subdural hematoma ([place]) 04/08/2019  ? Tachycardia-bradycardia syndrome ([place]) 04/13/2021  ? Type 2 diabetes mellitus with hyperlipidemia ([place])   ? ? ?Past Surgical History:  ?Procedure Laterality Date  ? ABDOMINAL HYSTERECTOMY    ? APPENDECTOMY    ? CARPAL TUNNEL RELEASE    ? Left  ? Colonscopy  2004  ? JOINT REPLACEMENT  11/2008  ? Left Knee Replacement  ? ROTATOR CUFF REPAIR    ? Right  ? TONSILLECTOMY    ? TOTAL KNEE ARTHROPLASTY  08/20/2011  ? Procedure: TOTAL KNEE ARTHROPLASTY;  Surgeon: Rudean Haskell, MD;  Location: [facility];  Service: Orthopedics;  Laterality: Right;  ? ? ?Current Medications: ?Current Meds  ?Medication Sig  ? amiodarone (PACERONE) 200 MG tablet Take 100 mg by mouth daily.  ? amLODipine (NORVASC) 5 MG tablet Take 5 mg by mouth daily.  ? Ascorbic Acid (VITAMIN C PO) Take 1 tablet by mouth daily.  ? atorvastatin (LIPITOR) 20 MG tablet Take 20 mg by mouth daily.  ? Calcium Carbonate-Vitamin D (CALTRATE 600+D PO) Take 1 tablet by mouth daily.  ? Cholecalciferol (VITAMIN D-3 PO) Take 1 capsule by mouth daily.  ? donepezil (ARICEPT) 10 MG tablet Take 10 mg by mouth daily.  ? ELIQUIS 5 MG TABS tablet Take 1 tablet (5 mg total) by mouth 2 (two) times daily.  ? EUTHYROX 25 MCG tablet Take 25 mcg by mouth daily.  ? gabapentin (NEURONTIN) 100 MG capsule Take 100 mg by mouth at bedtime.  ? losartan (COZAAR) 50 MG tablet Take 50 mg by mouth daily.  ? memantine (NAMENDA) 5 MG tablet Take 5 mg by mouth daily.  ? methocarbamol (ROBAXIN) 500 MG tablet Take 500 mg by mouth every 6 (six) hours as needed. For muscle  spasms  ? metoprolol tartrate (LOPRESSOR) 25 MG tablet Take 25 mg by mouth 2 (two) times daily.  ? potassium chloride SA (KLOR-CON) 20 MEQ tablet Take 20 mEq by mouth as needed. To be taken when takes torsemide  ? sitaGLIPtin (JANUVIA) 100 MG tablet Take 50 mg by mouth daily.  ? torsemide (DEMADEX) 20 MG tablet Take 20 mg by mouth daily.  ? Vibegron 75 MG TABS Take 75 mg by mouth daily.  ? [DISCONTINUED] donepezil (ARICEPT) 5 MG tablet Take 5 mg by mouth at bedtime.  ?  ? ?Allergies:   Latex, Ace inhibitors, Amoxicillin, Hydrocodone, and Prednisone  ? ?Social History  ? ?Socioeconomic History  ? Marital status: Married  ?  Spouse name: Not on file  ? Number of children: Not on file  ? Years of education: Not on file  ? Highest education level: Not on file  ?Occupational History  ? Not on  file  ?Tobacco Use  ? Smoking status: Never  ? Smokeless tobacco: Never  ?Substance and Sexual Activity  ? Alcohol use: No  ? Drug use: No  ? Sexual activity: Not on file  ?Other Topics Concern  ? Not on file  ?Social History Narrative  ? Married for 58 yrs, 2 kids, 2 grandkids.  Previously worked in a [employer], then in [employer], retired 15 yrs ago.  Graduated 12th grade, has Medicare and [employer].  Previously enjoyed Diplomatic Services operational officer games" (shuffleboard, etc.) until her knee problems.  ? ?Social Determinants of Health  ? ?Financial Resource Strain: Not on file  ?Food Insecurity: Not on file  ?Transportation Needs: Not on file  ?Physical Activity: Not on file  ?Stress: Not on file  ?Social Connections: Not on file  ?  ? ?Family History: ?The patient's family history includes CAD in her mother. There is no history of Anesthesia problems. ? ?ROS:   ?Please see the history of present illness.    ?All other systems reviewed and are negative. ? ?EKGs/Labs/Other Studies Reviewed:   ? ?The following studies were reviewed today: ?EKG reveals paced rhythm ? ? ?Recent Labs: ?08/07/2021: ALT 22; NT-Pro BNP 982; TSH 1.220 ?08/16/2021:  BUN 20; Creatinine, Ser 1.47; Potassium 4.0; Sodium 138  ?Recent Lipid Panel ?No results found for: CHOL, TRIG, HDL, CHOLHDL, VLDL, LDLCALC, LDLDIRECT ? ?Physical Exam:   ? ?VS:  BP (!) 160/64 (BP Location: Left Arm

## 2021-08-23 NOTE — Patient Instructions (Signed)
Medication Instructions:  ?Your physician recommends that you continue on your current medications as directed. Please refer to the Current Medication list given to you today. ? ?*If you need a refill on your cardiac medications before your next appointment, please call your pharmacy* ? ? ?Lab Work: ?Your physician recommends that you have a BNP and BMET today. ? ?If you have labs (blood work) drawn today and your tests are completely normal, you will receive your results only by: ?MyChart Message (if you have MyChart) OR ?A paper copy in the mail ?If you have any lab test that is abnormal or we need to change your treatment, we will call you to review the results. ? ? ?Testing/Procedures: ?Your physician has requested that you have an echocardiogram. Echocardiography is a painless test that uses sound waves to create images of your heart. It provides your doctor with information about the size and shape of your heart and how well your heart?s chambers and valves are working. This procedure takes approximately one hour. There are no restrictions for this procedure. ? ? ? ?Follow-Up: ?At [facility], you and your health needs are our priority.  As part of our continuing mission to provide you with exceptional heart care, we have created designated Provider Care Teams.  These Care Teams include your primary Cardiologist (physician) and Advanced Practice Providers (APPs -  Physician Assistants and Nurse Practitioners) who all work together to provide you with the care you need, when you need it. ? ?We recommend signing up for the patient portal called "MyChart".  Sign up information is provided on this After Visit Summary.  MyChart is used to connect with patients for Virtual Visits (Telemedicine).  Patients are able to view lab/test results, encounter notes, upcoming appointments, etc.  Non-urgent messages can be sent to your provider as well.   ?To learn more about what you can do with MyChart, go to  [email or web address].   ? ?Your next appointment:   ?3 month(s) ? ?The format for your next appointment:   ?In Person ? ?Provider:   ?Jyl Heinz, MD ? ? ?Other Instructions ?Echocardiogram ?An echocardiogram is a test that uses sound waves (ultrasound) to produce images of the heart. ?Images from an echocardiogram can provide important information about: ?Heart size and shape. ?The size and thickness and movement of your heart's walls. ?Heart muscle function and strength. ?Heart valve function or if you have stenosis. Stenosis is when the heart valves are too narrow. ?If blood is flowing backward through the heart valves (regurgitation). ?A tumor or infectious growth around the heart valves. ?Areas of heart muscle that are not working well because of poor blood flow or injury from a heart attack. ?Aneurysm detection. An aneurysm is a weak or damaged part of an artery wall. The wall bulges out from the normal force of blood pumping through the body. ?Tell a health care provider about: ?Any allergies you have. ?All medicines you are taking, including vitamins, herbs, eye drops, creams, and over-the-counter medicines. ?Any blood disorders you have. ?Any surgeries you have had. ?Any medical conditions you have. ?Whether you are pregnant or may be pregnant. ?What are the risks? ?Generally, this is a safe test. However, problems may occur, including an allergic reaction to dye (contrast) that may be used during the test. ?What happens before the test? ?No specific preparation is needed. You may eat and drink normally. ?What happens during the test? ?You will take off your clothes from the waist up and put on a  hospital gown. ?Electrodes or electrocardiogram (ECG)patches may be placed on your chest. The electrodes or patches are then connected to a device that monitors your heart rate and rhythm. ?You will lie down on a table for an ultrasound exam. A gel will be applied to your chest to help sound waves pass  through your skin. ?A handheld device, called a transducer, will be pressed against your chest and moved over your heart. The transducer produces sound waves that travel to your heart and bounce back (or "echo" back) to the transducer. These sound waves will be captured in real-time and changed into images of your heart that can be viewed on a video monitor. The images will be recorded on a computer and reviewed by your health care provider. ?You may be asked to change positions or hold your breath for a short time. This makes it easier to get different views or better views of your heart. ?In some cases, you may receive contrast through an IV in one of your veins. This can improve the quality of the pictures from your heart. ?The procedure may vary among health care providers and hospitals.   ?What can I expect after the test? ?You may return to your normal, everyday life, including diet, activities, and medicines, unless your health care provider tells you not to do that. ?Follow these instructions at home: ?It is up to you to get the results of your test. Ask your health care provider, or the department that is doing the test, when your results will be ready. ?Keep all follow-up visits. This is important. ?Summary ?An echocardiogram is a test that uses sound waves (ultrasound) to produce images of the heart. ?Images from an echocardiogram can provide important information about the size and shape of your heart, heart muscle function, heart valve function, and other possible heart problems. ?You do not need to do anything to prepare before this test. You may eat and drink normally. ?After the echocardiogram is completed, you may return to your normal, everyday life, unless your health care provider tells you not to do that. ?This information is not intended to replace advice given to you by your health care provider. Make sure you discuss any questions you have with your health care provider. ?Document Revised:  12/22/2019 Document Reviewed: 12/22/2019 ?Elsevier Patient Education ? [place]. ? ? ?

## 2021-08-24 DIAGNOSIS — L720 Epidermal cyst: Secondary | ICD-10-CM | POA: Diagnosis not present

## 2021-08-24 DIAGNOSIS — L570 Actinic keratosis: Secondary | ICD-10-CM | POA: Diagnosis not present

## 2021-08-24 LAB — BASIC METABOLIC PANEL
BUN/Creatinine Ratio: 12 (ref 12–28)
BUN: 18 mg/dL (ref 8–27)
CO2: 28 mmol/L (ref 20–29)
Calcium: 9.7 mg/dL (ref 8.7–10.3)
Chloride: 98 mmol/L (ref 96–106)
Creatinine, Ser: 1.54 mg/dL — ABNORMAL HIGH (ref 0.57–1.00)
Glucose: 85 mg/dL (ref 70–99)
Potassium: 4.1 mmol/L (ref 3.5–5.2)
Sodium: 139 mmol/L (ref 134–144)
eGFR: 32 mL/min/{1.73_m2} — ABNORMAL LOW (ref >59)

## 2021-08-24 LAB — BRAIN NATRIURETIC PEPTIDE: BNP: 151 pg/mL — ABNORMAL HIGH (ref 0.0–100.0)

## 2021-08-30 ENCOUNTER — Ambulatory Visit (INDEPENDENT_AMBULATORY_CARE_PROVIDER_SITE_OTHER): Payer: Medicare HMO

## 2021-08-30 DIAGNOSIS — Z8679 Personal history of other diseases of the circulatory system: Secondary | ICD-10-CM

## 2021-08-30 DIAGNOSIS — I480 Paroxysmal atrial fibrillation: Secondary | ICD-10-CM | POA: Diagnosis not present

## 2021-08-30 LAB — ECHOCARDIOGRAM COMPLETE
Area-P 1/2: 3.31 cm2
S' Lateral: 3.2 cm

## 2021-09-01 DIAGNOSIS — K529 Noninfective gastroenteritis and colitis, unspecified: Secondary | ICD-10-CM | POA: Diagnosis not present

## 2021-09-01 DIAGNOSIS — Z6827 Body mass index (BMI) 27.0-27.9, adult: Secondary | ICD-10-CM | POA: Diagnosis not present

## 2021-09-01 DIAGNOSIS — I10 Essential (primary) hypertension: Secondary | ICD-10-CM | POA: Diagnosis not present

## 2021-09-15 DIAGNOSIS — A084 Viral intestinal infection, unspecified: Secondary | ICD-10-CM | POA: Diagnosis not present

## 2021-09-15 DIAGNOSIS — Z6827 Body mass index (BMI) 27.0-27.9, adult: Secondary | ICD-10-CM | POA: Diagnosis not present

## 2021-09-18 ENCOUNTER — Encounter: Payer: Medicare Other | Admitting: Cardiology

## 2021-09-24 DIAGNOSIS — N179 Acute kidney failure, unspecified: Secondary | ICD-10-CM | POA: Diagnosis not present

## 2021-09-24 DIAGNOSIS — R5383 Other fatigue: Secondary | ICD-10-CM | POA: Diagnosis not present

## 2021-09-24 DIAGNOSIS — N189 Chronic kidney disease, unspecified: Secondary | ICD-10-CM | POA: Diagnosis not present

## 2021-09-24 DIAGNOSIS — K5730 Diverticulosis of large intestine without perforation or abscess without bleeding: Secondary | ICD-10-CM | POA: Diagnosis not present

## 2021-09-24 DIAGNOSIS — I129 Hypertensive chronic kidney disease with stage 1 through stage 4 chronic kidney disease, or unspecified chronic kidney disease: Secondary | ICD-10-CM | POA: Diagnosis not present

## 2021-09-24 DIAGNOSIS — R918 Other nonspecific abnormal finding of lung field: Secondary | ICD-10-CM | POA: Diagnosis not present

## 2021-09-24 DIAGNOSIS — E860 Dehydration: Secondary | ICD-10-CM | POA: Diagnosis not present

## 2021-09-24 DIAGNOSIS — R197 Diarrhea, unspecified: Secondary | ICD-10-CM | POA: Diagnosis not present

## 2021-09-24 DIAGNOSIS — R531 Weakness: Secondary | ICD-10-CM | POA: Diagnosis not present

## 2021-09-24 DIAGNOSIS — K8020 Calculus of gallbladder without cholecystitis without obstruction: Secondary | ICD-10-CM | POA: Diagnosis not present

## 2021-09-24 DIAGNOSIS — I700 Atherosclerosis of aorta: Secondary | ICD-10-CM | POA: Diagnosis not present

## 2021-09-26 ENCOUNTER — Ambulatory Visit (INDEPENDENT_AMBULATORY_CARE_PROVIDER_SITE_OTHER): Payer: Medicare HMO

## 2021-09-26 DIAGNOSIS — I495 Sick sinus syndrome: Secondary | ICD-10-CM | POA: Diagnosis not present

## 2021-09-26 LAB — CUP PACEART REMOTE DEVICE CHECK
Battery Remaining Longevity: 121 mo
Battery Voltage: 3.02 V
Brady Statistic AP VP Percent: 19.24 %
Brady Statistic AP VS Percent: 79.8 %
Brady Statistic AS VP Percent: 0.09 %
Brady Statistic AS VS Percent: 0.88 %
Brady Statistic RA Percent Paced: 99.08 %
Brady Statistic RV Percent Paced: 19.32 %
Date Time Interrogation Session: 20230514122545
Implantable Lead Implant Date: 20201124
Implantable Lead Implant Date: 20201124
Implantable Lead Location: 753859
Implantable Lead Location: 753860
Implantable Lead Model: 5076
Implantable Lead Model: 5076
Implantable Pulse Generator Implant Date: 20201124
Lead Channel Impedance Value: 323 Ohm
Lead Channel Impedance Value: 361 Ohm
Lead Channel Impedance Value: 361 Ohm
Lead Channel Impedance Value: 399 Ohm
Lead Channel Pacing Threshold Amplitude: 0.5 V
Lead Channel Pacing Threshold Amplitude: 0.75 V
Lead Channel Pacing Threshold Pulse Width: 0.4 ms
Lead Channel Pacing Threshold Pulse Width: 0.4 ms
Lead Channel Sensing Intrinsic Amplitude: 3.25 mV
Lead Channel Sensing Intrinsic Amplitude: 3.25 mV
Lead Channel Sensing Intrinsic Amplitude: 5.375 mV
Lead Channel Sensing Intrinsic Amplitude: 5.375 mV
Lead Channel Setting Pacing Amplitude: 1.5 V
Lead Channel Setting Pacing Amplitude: 2 V
Lead Channel Setting Pacing Pulse Width: 0.4 ms
Lead Channel Setting Sensing Sensitivity: 1.2 mV

## 2021-10-03 DIAGNOSIS — I700 Atherosclerosis of aorta: Secondary | ICD-10-CM | POA: Diagnosis not present

## 2021-10-03 DIAGNOSIS — K8020 Calculus of gallbladder without cholecystitis without obstruction: Secondary | ICD-10-CM | POA: Diagnosis not present

## 2021-10-03 DIAGNOSIS — R350 Frequency of micturition: Secondary | ICD-10-CM | POA: Diagnosis not present

## 2021-10-03 DIAGNOSIS — Z6827 Body mass index (BMI) 27.0-27.9, adult: Secondary | ICD-10-CM | POA: Diagnosis not present

## 2021-10-03 DIAGNOSIS — E039 Hypothyroidism, unspecified: Secondary | ICD-10-CM | POA: Diagnosis not present

## 2021-10-03 DIAGNOSIS — R9389 Abnormal findings on diagnostic imaging of other specified body structures: Secondary | ICD-10-CM | POA: Diagnosis not present

## 2021-10-03 DIAGNOSIS — R0609 Other forms of dyspnea: Secondary | ICD-10-CM | POA: Diagnosis not present

## 2021-10-12 DIAGNOSIS — I739 Peripheral vascular disease, unspecified: Secondary | ICD-10-CM | POA: Diagnosis not present

## 2021-10-12 DIAGNOSIS — E1151 Type 2 diabetes mellitus with diabetic peripheral angiopathy without gangrene: Secondary | ICD-10-CM | POA: Diagnosis not present

## 2021-10-12 DIAGNOSIS — L603 Nail dystrophy: Secondary | ICD-10-CM | POA: Diagnosis not present

## 2021-10-12 NOTE — Progress Notes (Signed)
Remote pacemaker transmission.   

## 2021-10-16 DIAGNOSIS — H524 Presbyopia: Secondary | ICD-10-CM | POA: Diagnosis not present

## 2021-10-17 DIAGNOSIS — J4520 Mild intermittent asthma, uncomplicated: Secondary | ICD-10-CM | POA: Diagnosis not present

## 2021-10-17 DIAGNOSIS — R918 Other nonspecific abnormal finding of lung field: Secondary | ICD-10-CM | POA: Diagnosis not present

## 2021-10-27 DIAGNOSIS — M19011 Primary osteoarthritis, right shoulder: Secondary | ICD-10-CM | POA: Diagnosis not present

## 2021-10-27 DIAGNOSIS — Z6827 Body mass index (BMI) 27.0-27.9, adult: Secondary | ICD-10-CM | POA: Diagnosis not present

## 2021-10-27 DIAGNOSIS — E785 Hyperlipidemia, unspecified: Secondary | ICD-10-CM | POA: Diagnosis not present

## 2021-10-27 DIAGNOSIS — N3281 Overactive bladder: Secondary | ICD-10-CM | POA: Diagnosis not present

## 2021-10-27 DIAGNOSIS — Z9181 History of falling: Secondary | ICD-10-CM | POA: Diagnosis not present

## 2021-10-27 DIAGNOSIS — Z1331 Encounter for screening for depression: Secondary | ICD-10-CM | POA: Diagnosis not present

## 2021-10-27 DIAGNOSIS — M7551 Bursitis of right shoulder: Secondary | ICD-10-CM | POA: Diagnosis not present

## 2021-10-27 DIAGNOSIS — J984 Other disorders of lung: Secondary | ICD-10-CM | POA: Diagnosis not present

## 2021-10-27 DIAGNOSIS — E1169 Type 2 diabetes mellitus with other specified complication: Secondary | ICD-10-CM | POA: Diagnosis not present

## 2021-11-10 DIAGNOSIS — I10 Essential (primary) hypertension: Secondary | ICD-10-CM | POA: Diagnosis not present

## 2021-11-10 DIAGNOSIS — E7800 Pure hypercholesterolemia, unspecified: Secondary | ICD-10-CM | POA: Diagnosis not present

## 2021-11-13 ENCOUNTER — Encounter: Payer: Medicare HMO | Admitting: Cardiology

## 2021-11-15 DIAGNOSIS — R918 Other nonspecific abnormal finding of lung field: Secondary | ICD-10-CM | POA: Diagnosis not present

## 2021-11-20 ENCOUNTER — Other Ambulatory Visit: Payer: Self-pay

## 2021-11-22 ENCOUNTER — Ambulatory Visit: Payer: Medicare HMO | Admitting: Cardiology

## 2021-11-22 ENCOUNTER — Encounter: Payer: Self-pay | Admitting: Cardiology

## 2021-11-22 VITALS — BP 162/76 | HR 94 | Ht 67.0 in | Wt 170.6 lb

## 2021-11-22 DIAGNOSIS — I10 Essential (primary) hypertension: Secondary | ICD-10-CM

## 2021-11-22 DIAGNOSIS — Z8679 Personal history of other diseases of the circulatory system: Secondary | ICD-10-CM

## 2021-11-22 DIAGNOSIS — I480 Paroxysmal atrial fibrillation: Secondary | ICD-10-CM | POA: Diagnosis not present

## 2021-11-22 DIAGNOSIS — E782 Mixed hyperlipidemia: Secondary | ICD-10-CM | POA: Diagnosis not present

## 2021-11-22 DIAGNOSIS — E785 Hyperlipidemia, unspecified: Secondary | ICD-10-CM

## 2021-11-22 DIAGNOSIS — E1169 Type 2 diabetes mellitus with other specified complication: Secondary | ICD-10-CM | POA: Diagnosis not present

## 2021-11-22 NOTE — Progress Notes (Signed)
Cardiology Office Note:    Date:  11/22/2021   ID:  Sabrina Hunt, DOB [birth year], MRN [id number]  PCP:  Street, Sharon Mt, MD  Cardiologist:  Jenean Lindau, MD   Referring MD: [address] Sharon Mt, *    ASSESSMENT:    1. Paroxysmal atrial fibrillation (HCC)   2. Benign essential hypertension   3. Mixed hyperlipidemia   4. Type 2 diabetes mellitus with hyperlipidemia ([place])   5. History of cardiomyopathy    PLAN:    In order of problems listed above:  Primary prevention stressed with the patient.  Importance of compliance with diet medication stressed and she vocalized understanding.  She ambulates to the best of her ability. Paroxysmal atrial fibrillation:I discussed with the patient atrial fibrillation, disease process. Management and therapy including rate and rhythm control, anticoagulation benefits and potential risks were discussed extensively with the patient. Patient had multiple questions which were answered to patient's satisfaction.  Benefits and potential risks of amiodarone therapy discussed with the patient at length.  We will try to get the lab work from the recent hospital admission and review it.  She is followed by pulmonologist.  She has had a CT of the chest and I reviewed.  She is has an appointment coming up with the next day or 2.  She will discuss this issue with her pulmonologist. History of cardiomyopathy: Normal ejection fraction at this time.  Patient on appropriate therapy. Diabetes mellitus and mixed dyslipidemia: Stable at this time managed by primary care. Essential hypertension: Blood pressure stable.  She has an element of whitecoat hypertension.  I am not very much for aggressive blood pressure lowering in this patient in view of advanced age and history of some dementia issues.  That may make her more prone to fall and have adverse consequences and the risks might outweigh the benefits. Patient will be seen in follow-up appointment in 6 months  or earlier if the patient has any concerns After the patient left we got a copy of the records from [facility].  LFTs are marginally elevated so we told the patient to get blood work including LFTs at pulmonologist office at the end of the week and send [place] a copy.  She and her husband understand and we will do blood work and send [place] a copy.    Medication Adjustments/Labs and Tests Ordered: Current medicines are reviewed at length with the patient today.  Concerns regarding medicines are outlined above.  No orders of the defined types were placed in this encounter.  No orders of the defined types were placed in this encounter.    No chief complaint on file.    History of Present Illness:    Sabrina Hunt is a 88 y.o. female.  Patient has past medical history of paroxysmal atrial fibrillation, essential hypertension, diabetes mellitus, dyslipidemia and is on amiodarone therapy.  She denies any problems at this time and takes care of activities of daily living.  No chest pain orthopnea or PND.  She mentions to me that she has an element of whitecoat hypertension.  Her blood pressure is fine at home.  At the time of my evaluation, the patient is alert awake oriented and in no distress.  Past Medical History:  Diagnosis Date   Acquired thrombophilia ([place])    Anemia    Arthritis    Atrial flutter ([place]) 03/20/2021   Benign essential hypertension    Bilateral hand pain 01/17/2016   Cardiac pacemaker in situ  Chronic kidney disease, stage 3a ([place])    Diabetes mellitus 08/25/2011   Diabetic peripheral neuropathy ([place]) 08/25/2011   Dyslipidemia    Fall 04/08/2019   History of cardiomyopathy 08/23/2021   HLD (hyperlipidemia) 08/25/2011   HTN (hypertension) 08/25/2011   Hypothyroidism    Left ankle injury    Swelling   Left foot pain 02/21/2015   Long term current use of anticoagulant 03/20/2021   Lumbar spondylosis    Multilevel degenerative disc disease    Neuromuscular disorder  (HCC)    Normocytic anemia 08/25/2011   Osteoarthritis of both knees 08/25/2011   S/p bilateral replacement, last 08/20/11   Overactive bladder    Paroxysmal atrial fibrillation ([place]) 04/13/2021   Paroxysmal cardiac arrhythmia    RBBB (right bundle branch block)    Reflux gastritis    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   Secondary neuropathy    Senile osteopenia    Shortness of breath 08/25/2011   Shoulder joint pain 08/14/2012   Subdural hematoma ([place]) 04/08/2019   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   Type 2 diabetes mellitus with hyperlipidemia [facility])     Past Surgical History:  Procedure Laterality Date   ABDOMINAL HYSTERECTOMY     APPENDECTOMY     CARPAL TUNNEL RELEASE     Left   Colonscopy  2004   JOINT REPLACEMENT  11/2008   Left Knee Replacement   ROTATOR CUFF REPAIR     Right   TONSILLECTOMY     TOTAL KNEE ARTHROPLASTY  08/20/2011   Procedure: TOTAL KNEE ARTHROPLASTY;  Surgeon: Rudean Haskell, MD;  Location: [facility];  Service: Orthopedics;  Laterality: Right;    Current Medications: Current Meds  Medication Sig   amiodarone (PACERONE) 200 MG tablet Take 100 mg by mouth daily.   amLODipine (NORVASC) 5 MG tablet Take 5 mg by mouth daily.   Ascorbic Acid (VITAMIN C PO) Take 1 tablet by mouth daily.   atorvastatin (LIPITOR) 20 MG tablet Take 20 mg by mouth daily.   Calcium Carbonate-Vitamin D (CALTRATE 600+D PO) Take 1 tablet by mouth daily.   Cholecalciferol (VITAMIN D-3 PO) Take 1 capsule by mouth daily.   donepezil (ARICEPT) 10 MG tablet Take 10 mg by mouth daily.   ELIQUIS 5 MG TABS tablet Take 1 tablet (5 mg total) by mouth 2 (two) times daily.   EUTHYROX 25 MCG tablet Take 25 mcg by mouth daily.   gabapentin (NEURONTIN) 100 MG capsule Take 100 mg by mouth at bedtime.   losartan (COZAAR) 25 MG tablet Take 25 mg by mouth daily.   memantine (NAMENDA) 5 MG tablet Take 5 mg by mouth daily.   methocarbamol (ROBAXIN) 500 MG tablet Take 500 mg by mouth  every 6 (six) hours as needed. For muscle spasms   metoprolol tartrate (LOPRESSOR) 25 MG tablet Take 25 mg by mouth 2 (two) times daily.   potassium chloride SA (KLOR-CON) 20 MEQ tablet Take 20 mEq by mouth as needed. To be taken when takes torsemide   sitaGLIPtin (JANUVIA) 100 MG tablet Take 50 mg by mouth daily.   torsemide (DEMADEX) 10 MG tablet Take 10 mg by mouth daily as needed for edema.   Vibegron 75 MG TABS Take 75 mg by mouth daily.     Allergies:   Latex, Ace inhibitors, Amoxicillin, Hydrocodone, and Prednisone   Social History   Socioeconomic History   Marital status: Married    Spouse name: Not on file   Number of children: Not on  file   Years of education: Not on file   Highest education level: Not on file  Occupational History   Not on file  Tobacco Use   Smoking status: Never   Smokeless tobacco: Never  Substance and Sexual Activity   Alcohol use: No   Drug use: No   Sexual activity: Not on file  Other Topics Concern   Not on file  Social History Narrative   Married for 24 yrs, 2 kids, 2 grandkids.  Previously worked in a [employer], then in [employer], retired 15 yrs ago.  Graduated 12th grade, has Medicare and [employer].  Previously enjoyed Diplomatic Services operational officer games" (shuffleboard, etc.) until her knee problems.   Social Determinants of Health   Financial Resource Strain: Not on file  Food Insecurity: Not on file  Transportation Needs: Not on file  Physical Activity: Not on file  Stress: Not on file  Social Connections: Not on file     Family History: The patient's family history includes CAD in her mother. There is no history of Anesthesia problems.  ROS:   Please see the history of present illness.    All other systems reviewed and are negative.  EKGs/Labs/Other Studies Reviewed:    The following studies were reviewed today: I discussed my findings with the patient at length.   Recent Labs: 08/07/2021: ALT 22; NT-Pro BNP 982; TSH  1.220 08/23/2021: BNP 151.0; BUN 18; Creatinine, Ser 1.54; Potassium 4.1; Sodium 139  Recent Lipid Panel No results found for: "CHOL", "TRIG", "HDL", "CHOLHDL", "VLDL", "LDLCALC", "LDLDIRECT"  Physical Exam:    VS:  BP (!) 162/76   Pulse 94   Ht '5\' 7"'$  (1.702 m)   Wt 170 lb 9.6 oz (77.4 kg)   SpO2 98%   BMI 26.72 kg/m     Wt Readings from Last 3 Encounters:  11/22/21 170 lb 9.6 oz (77.4 kg)  08/23/21 167 lb (75.8 kg)  08/16/21 166 lb 12.8 oz (75.7 kg)     GEN: Patient is in no acute distress HEENT: Normal NECK: No JVD; No carotid bruits LYMPHATICS: No lymphadenopathy CARDIAC: Hear sounds regular, 2/6 systolic murmur at the apex. RESPIRATORY:  Clear to auscultation without rales, wheezing or rhonchi  ABDOMEN: Soft, non-tender, non-distended MUSCULOSKELETAL:  No edema; No deformity  SKIN: Warm and dry NEUROLOGIC:  Alert and oriented x 3 PSYCHIATRIC:  Normal affect   Signed, Jenean Lindau, MD  11/22/2021 10:58 AM    [place]

## 2021-11-22 NOTE — Patient Instructions (Signed)

## 2021-11-24 DIAGNOSIS — R918 Other nonspecific abnormal finding of lung field: Secondary | ICD-10-CM | POA: Diagnosis not present

## 2021-11-24 DIAGNOSIS — J4520 Mild intermittent asthma, uncomplicated: Secondary | ICD-10-CM | POA: Diagnosis not present

## 2021-11-26 DIAGNOSIS — E119 Type 2 diabetes mellitus without complications: Secondary | ICD-10-CM | POA: Diagnosis not present

## 2021-11-26 DIAGNOSIS — L03116 Cellulitis of left lower limb: Secondary | ICD-10-CM | POA: Diagnosis not present

## 2021-11-26 DIAGNOSIS — Z7901 Long term (current) use of anticoagulants: Secondary | ICD-10-CM | POA: Diagnosis not present

## 2021-11-26 DIAGNOSIS — Z950 Presence of cardiac pacemaker: Secondary | ICD-10-CM | POA: Diagnosis not present

## 2021-11-26 DIAGNOSIS — Z79899 Other long term (current) drug therapy: Secondary | ICD-10-CM | POA: Diagnosis not present

## 2021-11-26 DIAGNOSIS — I10 Essential (primary) hypertension: Secondary | ICD-10-CM | POA: Diagnosis not present

## 2021-11-26 DIAGNOSIS — M7989 Other specified soft tissue disorders: Secondary | ICD-10-CM | POA: Diagnosis not present

## 2021-11-26 DIAGNOSIS — R197 Diarrhea, unspecified: Secondary | ICD-10-CM | POA: Diagnosis not present

## 2021-11-26 DIAGNOSIS — J9811 Atelectasis: Secondary | ICD-10-CM | POA: Diagnosis not present

## 2021-12-04 DIAGNOSIS — I872 Venous insufficiency (chronic) (peripheral): Secondary | ICD-10-CM | POA: Diagnosis not present

## 2021-12-04 DIAGNOSIS — R4181 Age-related cognitive decline: Secondary | ICD-10-CM | POA: Diagnosis not present

## 2021-12-04 DIAGNOSIS — Z6826 Body mass index (BMI) 26.0-26.9, adult: Secondary | ICD-10-CM | POA: Diagnosis not present

## 2021-12-04 DIAGNOSIS — N1831 Chronic kidney disease, stage 3a: Secondary | ICD-10-CM | POA: Diagnosis not present

## 2021-12-04 DIAGNOSIS — L03119 Cellulitis of unspecified part of limb: Secondary | ICD-10-CM | POA: Diagnosis not present

## 2021-12-04 DIAGNOSIS — F332 Major depressive disorder, recurrent severe without psychotic features: Secondary | ICD-10-CM | POA: Diagnosis not present

## 2021-12-14 DIAGNOSIS — I739 Peripheral vascular disease, unspecified: Secondary | ICD-10-CM | POA: Diagnosis not present

## 2021-12-14 DIAGNOSIS — L603 Nail dystrophy: Secondary | ICD-10-CM | POA: Diagnosis not present

## 2021-12-14 DIAGNOSIS — E1151 Type 2 diabetes mellitus with diabetic peripheral angiopathy without gangrene: Secondary | ICD-10-CM | POA: Diagnosis not present

## 2021-12-19 DIAGNOSIS — N390 Urinary tract infection, site not specified: Secondary | ICD-10-CM | POA: Diagnosis not present

## 2021-12-25 DIAGNOSIS — Z6827 Body mass index (BMI) 27.0-27.9, adult: Secondary | ICD-10-CM | POA: Diagnosis not present

## 2021-12-25 DIAGNOSIS — R413 Other amnesia: Secondary | ICD-10-CM | POA: Diagnosis not present

## 2021-12-25 DIAGNOSIS — N390 Urinary tract infection, site not specified: Secondary | ICD-10-CM | POA: Diagnosis not present

## 2021-12-25 DIAGNOSIS — M1990 Unspecified osteoarthritis, unspecified site: Secondary | ICD-10-CM | POA: Diagnosis not present

## 2021-12-26 ENCOUNTER — Ambulatory Visit (INDEPENDENT_AMBULATORY_CARE_PROVIDER_SITE_OTHER): Payer: Medicare HMO

## 2021-12-26 DIAGNOSIS — I495 Sick sinus syndrome: Secondary | ICD-10-CM

## 2021-12-27 LAB — CUP PACEART REMOTE DEVICE CHECK
Battery Remaining Longevity: 119 mo
Battery Voltage: 3.02 V
Brady Statistic AP VP Percent: 13.38 %
Brady Statistic AP VS Percent: 85.79 %
Brady Statistic AS VP Percent: 0.02 %
Brady Statistic AS VS Percent: 0.81 %
Brady Statistic RA Percent Paced: 99.17 %
Brady Statistic RV Percent Paced: 13.4 %
Date Time Interrogation Session: 20230816101756
Implantable Lead Implant Date: 20201124
Implantable Lead Implant Date: 20201124
Implantable Lead Location: 753859
Implantable Lead Location: 753860
Implantable Lead Model: 5076
Implantable Lead Model: 5076
Implantable Pulse Generator Implant Date: 20201124
Lead Channel Impedance Value: 323 Ohm
Lead Channel Impedance Value: 361 Ohm
Lead Channel Impedance Value: 380 Ohm
Lead Channel Impedance Value: 456 Ohm
Lead Channel Pacing Threshold Amplitude: 0.5 V
Lead Channel Pacing Threshold Amplitude: 0.75 V
Lead Channel Pacing Threshold Pulse Width: 0.4 ms
Lead Channel Pacing Threshold Pulse Width: 0.4 ms
Lead Channel Sensing Intrinsic Amplitude: 3.125 mV
Lead Channel Sensing Intrinsic Amplitude: 3.125 mV
Lead Channel Sensing Intrinsic Amplitude: 4.875 mV
Lead Channel Sensing Intrinsic Amplitude: 4.875 mV
Lead Channel Setting Pacing Amplitude: 1.5 V
Lead Channel Setting Pacing Amplitude: 2 V
Lead Channel Setting Pacing Pulse Width: 0.4 ms
Lead Channel Setting Sensing Sensitivity: 1.2 mV

## 2021-12-30 DIAGNOSIS — Z950 Presence of cardiac pacemaker: Secondary | ICD-10-CM | POA: Diagnosis not present

## 2021-12-30 DIAGNOSIS — E119 Type 2 diabetes mellitus without complications: Secondary | ICD-10-CM | POA: Diagnosis not present

## 2021-12-30 DIAGNOSIS — I10 Essential (primary) hypertension: Secondary | ICD-10-CM | POA: Diagnosis not present

## 2021-12-30 DIAGNOSIS — R42 Dizziness and giddiness: Secondary | ICD-10-CM | POA: Diagnosis not present

## 2021-12-30 DIAGNOSIS — R55 Syncope and collapse: Secondary | ICD-10-CM | POA: Diagnosis not present

## 2022-01-03 DIAGNOSIS — Z6827 Body mass index (BMI) 27.0-27.9, adult: Secondary | ICD-10-CM | POA: Diagnosis not present

## 2022-01-03 DIAGNOSIS — R42 Dizziness and giddiness: Secondary | ICD-10-CM | POA: Diagnosis not present

## 2022-01-03 DIAGNOSIS — N3281 Overactive bladder: Secondary | ICD-10-CM | POA: Diagnosis not present

## 2022-01-03 DIAGNOSIS — F332 Major depressive disorder, recurrent severe without psychotic features: Secondary | ICD-10-CM | POA: Diagnosis not present

## 2022-01-03 DIAGNOSIS — R296 Repeated falls: Secondary | ICD-10-CM | POA: Diagnosis not present

## 2022-01-03 DIAGNOSIS — R4181 Age-related cognitive decline: Secondary | ICD-10-CM | POA: Diagnosis not present

## 2022-01-03 DIAGNOSIS — R2681 Unsteadiness on feet: Secondary | ICD-10-CM | POA: Diagnosis not present

## 2022-01-08 ENCOUNTER — Other Ambulatory Visit (INDEPENDENT_AMBULATORY_CARE_PROVIDER_SITE_OTHER): Payer: Medicare HMO

## 2022-01-08 ENCOUNTER — Encounter: Payer: Self-pay | Admitting: Physician Assistant

## 2022-01-08 ENCOUNTER — Ambulatory Visit: Payer: Medicare HMO | Admitting: Physician Assistant

## 2022-01-08 VITALS — BP 149/90 | HR 88 | Resp 18 | Ht 67.0 in | Wt 170.0 lb

## 2022-01-08 DIAGNOSIS — R413 Other amnesia: Secondary | ICD-10-CM | POA: Diagnosis not present

## 2022-01-08 DIAGNOSIS — F01A0 Vascular dementia, mild, without behavioral disturbance, psychotic disturbance, mood disturbance, and anxiety: Secondary | ICD-10-CM

## 2022-01-08 DIAGNOSIS — F0150 Vascular dementia without behavioral disturbance: Secondary | ICD-10-CM

## 2022-01-08 DIAGNOSIS — G309 Alzheimer's disease, unspecified: Secondary | ICD-10-CM | POA: Diagnosis not present

## 2022-01-08 LAB — TSH: TSH: 0.86 u[IU]/mL (ref 0.35–5.50)

## 2022-01-08 LAB — VITAMIN B12: Vitamin B-12: 709 pg/mL (ref 211–911)

## 2022-01-08 NOTE — Progress Notes (Incomplete)
Assessment/Plan:    The patient is seen in neurologic consultation at the request of Sabrina Paddy, NP for the evaluation of memory.  Sabrina Hunt is a very pleasant 88 y.o. year old RH female with  a history of hypertension, hyperlipidemia, PAF (was on Eliquis till ED), status post PMP (for tachybradycardia syndrome), history of cardiomyopathy, hypothyroidism, anemia, St 3 CKD, seen today for evaluation of memory loss.  Patient the patient was on Aricept by PCP at 5 mg p.o. nightly and most recently  memantine 10 mg p.o. twice daily was added.  However, she was seen at the ED on 8/24 after near syncopal episode and was found to have a SDH falcine area which increased from 4-8 mm. Neurosurgery evaluated patient and no intervention was recommended,  ASA was held but she continued Eliquis.  She also and was started on Keppra x 7 d for seizure prophylaxis.  She was discharged on meclizine as needed for dizziness, without any recurrence of her symptoms.  Of note, one of the side effects of memantine is dizziness and headaches, for which a lower titration is recommended for better tolerance, as the patient has intermittent symptoms.   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 Folllow up  in 3 months   Continue memantine but as follows: Reduce memantine to 1 tablet at night only for at least 2 weeks, then if tolerated, this can be increased to 2 times a day. Hold donepezil for now.   Subjective:    The patient is accompanied by her husband who supplements the history.    How long did patient have memory difficulties?  She reports that her memory is worse over the last 6 months, especially forgetting some recent conversations, and even her family's telephone numbers.  Patient lives with: Spouse who noticed changes as well.   repeats oneself?  Endorsed. Disoriented when walking into a room?  Patient denies    Leaving objects in unusual places?  Her husband reports that even if he is at plate site, she may not be able to find objects. Ambulates  with difficulty?   She has a history of osteoarthritis, which will limit her ambulation.  She does not like to use a cane. Recent falls?  Patient denies, but she did have a presyncopal episode as above. Any head injuries?Endorsed, heating "the back of my head ".  She had a presyncopal episode, but did not get to lose consciousness. History of seizures?   Patient denies   Wandering behavior?  Patient denies   Patient drives?   Patient no longer drives  Any mood changes such irritability agitation?  Patient denies   Any history of depression?:  Patient denies   Hallucinations?  Patient denies   Paranoia?  Patient denies   Patient reports that sleeps well without vivid dreams, REM behavior or sleepwalking    History of sleep apnea?  Patient denies   Any hygiene concerns?  Patient denies   Independent of bathing and dressing?  Endorsed  Does the patient needs help with medications? Both work together  Who is in charge of the finances?  Both worked together  Any changes in appetite?  Patient denies   Patient have trouble swallowing? Patient denies   Does the patient cook?  Patient denies   Any kitchen accidents such as leaving the stove on? Endorsed worse over the last 2 months. He is now supervising  Any headaches? Was at [facility] fell back after becoming dizzy, and also had a headache "in the back of the head. Double vision? Patient denies   Any focal numbness or tingling?  Patient denies   Chronic back pain Patient denies   Unilateral weakness?  Patient denies   Any tremors?  Patient denies   Any history of anosmia?  Patient denies   Any incontinence of urine?  She has a history of OAB,"same as always" Any bowel dysfunction?   Patient denies   History of heavy alcohol intake?  Patient denies   History of heavy tobacco use?  Patient denies   Family  history of dementia?  Patient denies    Mother may have had dementia .   Labs 12/2021 B12 488, UA unremarkable, neg CoVID, Hb 11.7, Hct 34.2, MCV 90,  Cr 1.5, Na133,   CT of the head without contrast was reviewed on 09/24/2021, showing age-related cerebral atrophy  stable extensive dural calcifications.   Allergies  Allergen Reactions   Latex Rash    bandages    Ace Inhibitors Cough   Amoxicillin Nausea And Vomiting   Hydrocodone Nausea Only   Prednisone Nausea And Vomiting    Current Outpatient Medications  Medication Instructions   amiodarone (PACERONE) 100 mg, Oral, Daily   amLODipine (NORVASC) 5 mg, Oral, Daily   Ascorbic Acid (VITAMIN C PO) 1 tablet, Oral, Daily   atorvastatin (LIPITOR) 20 mg, Oral, Daily   Calcium Carbonate-Vitamin D (CALTRATE 600+D PO) 1 tablet, Oral, Daily   Cholecalciferol (VITAMIN D-3 PO) 1 capsule, Daily   donepezil (ARICEPT) 10 mg, Oral, Daily   Eliquis 5 mg, Oral, 2 times daily   Euthyrox 25 mcg, Oral, Daily   gabapentin (NEURONTIN) 100 mg, Oral, Daily at bedtime   losartan (COZAAR) 25 mg, Oral, Daily   memantine (NAMENDA) 5 mg, Oral, Daily   methocarbamol (ROBAXIN) 500 mg, Every 6 hours PRN   metoprolol tartrate (LOPRESSOR) 25 mg, Oral, 2 times daily   potassium chloride SA (KLOR-CON) 20 MEQ tablet 20 mEq, Oral, As needed, To be taken when takes torsemide   sitaGLIPtin (JANUVIA) 50 mg, Daily   torsemide (DEMADEX) 10 mg, Oral, Daily PRN   Vibegron 75 mg, Oral, Daily     VITALS:   Vitals:   01/08/22 0753  BP: (!) 149/90  Pulse: 88  Resp: 18  SpO2: 98%  Weight: 170 lb (77.1 kg)  Height: '5\' 7"'$  (1.702 m)       No data to display          PHYSICAL EXAM   HEENT:  Normocephalic, atraumatic. The mucous membranes are moist. The superficial temporal arteries are without ropiness or tenderness. Cardiovascular: Regular rate and rhythm. Lungs: Clear to auscultation bilaterally. Neck: There are no carotid bruits noted  bilaterally.  NEUROLOGICAL:    01/09/2022    6:00 AM  Montreal Cognitive Assessment   Visuospatial/ Executive (0/5) 2  Naming (0/3) 2  Attention: Read list of digits (0/2) 1  Attention: Read list of letters (0/1) 0  Attention: Serial 7 subtraction starting at 100 (0/3) 0  Language: Repeat phrase (0/2) 1  Language : Fluency (0/1) 1  Abstraction (0/2) 2  Delayed Recall (0/5) 0  Orientation (0/6) 5  Total 14  Adjusted Score (based on education) 14        No data to display           Orientation:  Alert and oriented to person, place and time. No aphasia or  dysarthria. Fund of knowledge is appropriate. Recent and remote memory impaired.  Attention and concentration are reduced.  Able to name objects and repeat phrases 2/3 . Delayed recall 0/5 Cranial nerves: There is good facial symmetry. Extraocular muscles are intact and visual fields are full to confrontational testing. No nystagmus. Speech is fluent and clear. Soft palate rises symmetrically and there is no tongue deviation. Hearing is intact to conversational tone with a hearing aid. Tone: Tone is good throughout. Sensation: Sensation is intact to light touch and pinprick throughout. Vibration is intact at the bilateral big toe.There is no extinction with double simultaneous stimulation. There is no sensory dermatomal level identified. Coordination: The patient has no difficulty with RAM's or FNF bilaterally. Normal finger to nose  Motor: Strength is 5/5 in the bilateral upper and lower extremities. There is no pronator drift. There are no fasciculations noted. DTR's: Deep tendon reflexes are 2/4 at the bilateral biceps, triceps, brachioradialis, patella and achilles.  Plantar responses are downgoing bilaterally. Gait and Station: The patient is able to ambulate without difficulty.The patient is able to heel toe walk with some difficulty due to arthritis. The patient is able to ambulate in a tandem fashion. The patient is able to  stand in the Romberg position.No ataxia is noted     Thank you for allowing [place] the opportunity to participate in the care of this nice patient. Please do not hesitate to contact us for any questions or concerns.   Total time spent on today's visit was 57 minutes dedicated to this patient today, preparing to see patient, examining the patient, ordering tests and/or medications and counseling the patient, documenting clinical information in the EHR or other health record, independently interpreting results and communicating results to the patient/family, discussing treatment and goals, answering patient's questions and coordinating care.  Cc:  Street, Sharon Mt, MD  Sharene Butters 01/09/2022 6:44 AM

## 2022-01-08 NOTE — Patient Instructions (Addendum)
It was a pleasure to see you today at our office.   Recommendations:   Check labs today Reduce the memantine to 1 tablet at night only for at least 2 weeks, if ok, then you can increase it to 2 times a day  You do not need to be on 2 medicines for memory at this time unless memory is very bad. Side effects of memantine do include headaches and dizziness so if those continue after reducing the pill, hold it anc call us Please contact the cardiologist for the blood pressure medications management  Follow up in 3 months    Whom to call:  Memory  decline, memory medications: Call our office [phone]   For psychiatric meds, mood meds: Please have your primary care physician manage these medications.   Counseling regarding caregiver distress, including caregiver depression, anxiety and issues regarding community resources, adult day care programs, adult living facilities, or memory care questions:   Feel free to contact [place], Social Worker at [phone]   For assessment of decision of mental capacity and competency:  Call Dr. Anthoney Harada, geriatric psychiatrist at [phone]  For guidance in geriatric dementia issues please call [facility] [phone]   If you have any severe symptoms of a stroke, or other severe issues such as confusion,severe chills or fever, etc call 911 or go to the ER as you may need to be evaluated further   Feel free to visit Facebook page " [email or web address]" for tips of how to care for people with memory problems.        RECOMMENDATIONS FOR ALL PATIENTS WITH MEMORY PROBLEMS: 1. Continue to exercise (Recommend 30 minutes of walking everyday, or 3 hours every week) 2. Increase social interactions - continue going to [place] and enjoy social gatherings with friends and family 3. Eat healthy, avoid fried foods and eat more fruits and vegetables 4. Maintain adequate blood pressure, blood sugar, and blood cholesterol level. Reducing the  risk of stroke and cardiovascular disease also helps promoting better memory. 5. Avoid stressful situations. Live a simple life and avoid aggravations. Organize your time and prepare for the next day in anticipation. 6. Sleep well, avoid any interruptions of sleep and avoid any distractions in the bedroom that may interfere with adequate sleep quality 7. Avoid sugar, avoid sweets as there is a strong link between excessive sugar intake, diabetes, and cognitive impairment We discussed the Mediterranean diet, which has been shown to help patients reduce the risk of progressive memory disorders and reduces cardiovascular risk. This includes eating fish, eat fruits and green leafy vegetables, nuts like almonds and hazelnuts, walnuts, and also use olive oil. Avoid fast foods and fried foods as much as possible. Avoid sweets and sugar as sugar use has been linked to worsening of memory function.  There is always a concern of gradual progression of memory problems. If this is the case, then we may need to adjust level of care according to patient needs. Support, both to the patient and caregiver, should then be put into place.         FALL PRECAUTIONS: Be cautious when walking. Scan the area for obstacles that may increase the risk of trips and falls. When getting up in the mornings, sit up at the edge of the bed for a few minutes before getting out of bed. Consider elevating the bed at the head end to avoid drop of blood pressure when getting up. Walk always in a well-lit room (use night  lights in the walls). Avoid area rugs or power cords from appliances in the middle of the walkways. Use a walker or a cane if necessary and consider physical therapy for balance exercise. Get your eyesight checked regularly.  FINANCIAL OVERSIGHT: Supervision, especially oversight when making financial decisions or transactions is also recommended.  HOME SAFETY: Consider the safety of the kitchen when operating appliances  like stoves, microwave oven, and blender. Consider having supervision and share cooking responsibilities until no longer able to participate in those. Accidents with firearms and other hazards in the house should be identified and addressed as well.   ABILITY TO BE LEFT ALONE: If patient is unable to contact 911 operator, consider using LifeLine, or when the need is there, arrange for someone to stay with patients. Smoking is a fire hazard, consider supervision or cessation. Risk of wandering should be assessed by caregiver and if detected at any point, supervision and safe proof recommendations should be instituted.  MEDICATION SUPERVISION: Inability to self-administer medication needs to be constantly addressed. Implement a mechanism to ensure safe administration of the medications.   DRIVING: Regarding driving, in patients with progressive memory problems, driving will be impaired. We advise to have someone else do the driving if trouble finding directions or if minor accidents are reported. Independent driving assessment is available to determine safety of driving.   If you are interested in the driving assessment, you can contact the following:  [employer] in [place]  [place] [phone] or [phone]    [place] refers to food and lifestyle choices that are based on the traditions of countries located on the [employer]. This way of eating has been shown to help prevent certain conditions and improve outcomes for people who have chronic diseases, like kidney disease and heart disease. What are tips for following this plan? Lifestyle  Cook and eat meals together with your family, when possible. Drink enough fluid to keep your urine clear or pale yellow. Be physically active every day. This includes: Aerobic exercise like  running or swimming. Leisure activities like gardening, walking, or housework. Get 7-8 hours of sleep each night. If recommended by your health care provider, drink red wine in moderation. This means 1 glass a day for nonpregnant women and 2 glasses a day for men. A glass of wine equals 5 oz (150 mL). Reading food labels  Check the serving size of packaged foods. For foods such as rice and pasta, the serving size refers to the amount of cooked product, not dry. Check the total fat in packaged foods. Avoid foods that have saturated fat or trans fats. Check the ingredients list for added sugars, such as corn syrup. Shopping  At the grocery store, buy most of your food from the areas near the walls of the store. This includes: Fresh fruits and vegetables (produce). Grains, beans, nuts, and seeds. Some of these may be available in unpackaged forms or large amounts (in bulk). Fresh seafood. Poultry and eggs. Low-fat dairy products. Buy whole ingredients instead of prepackaged foods. Buy fresh fruits and vegetables in-season from local farmers markets. Buy frozen fruits and vegetables in resealable bags. If you do not have access to quality fresh seafood, buy precooked frozen shrimp or canned fish, such as tuna, salmon, or sardines. Buy small amounts of raw or cooked vegetables, salads, or olives from the deli or salad bar at your store. Stock your  pantry so you always have certain foods on hand, such as olive oil, canned tuna, canned tomatoes, rice, pasta, and beans. Cooking  Cook foods with extra-virgin olive oil instead of using butter or other vegetable oils. Have meat as a side dish, and have vegetables or grains as your main dish. This means having meat in small portions or adding small amounts of meat to foods like pasta or stew. Use beans or vegetables instead of meat in common dishes like chili or lasagna. Experiment with different cooking methods. Try roasting or broiling vegetables  instead of steaming or sauteing them. Add frozen vegetables to soups, stews, pasta, or rice. Add nuts or seeds for added healthy fat at each meal. You can add these to yogurt, salads, or vegetable dishes. Marinate fish or vegetables using olive oil, lemon juice, garlic, and fresh herbs. Meal planning  Plan to eat 1 vegetarian meal one day each week. Try to work up to 2 vegetarian meals, if possible. Eat seafood 2 or more times a week. Have healthy snacks readily available, such as: Vegetable sticks with hummus. Greek yogurt. Fruit and nut trail mix. Eat balanced meals throughout the week. This includes: Fruit: 2-3 servings a day Vegetables: 4-5 servings a day Low-fat dairy: 2 servings a day Fish, poultry, or lean meat: 1 serving a day Beans and legumes: 2 or more servings a week Nuts and seeds: 1-2 servings a day Whole grains: 6-8 servings a day Extra-virgin olive oil: 3-4 servings a day Limit red meat and sweets to only a few servings a month What are my food choices? Mediterranean diet Recommended Grains: Whole-grain pasta. Brown rice. Bulgar wheat. Polenta. Couscous. Whole-wheat bread. [place]. Vegetables: Artichokes. Beets. Broccoli. Cabbage. Carrots. Eggplant. Green beans. Chard. Kale. Spinach. Onions. Leeks. Peas. Squash. Tomatoes. Peppers. Radishes. Fruits: Apples. Apricots. Avocado. Berries. Bananas. Cherries. Dates. Figs. Grapes. Lemons. Melon. Oranges. Peaches. Plums. Pomegranate. Meats and other protein foods: Beans. Almonds. Sunflower seeds. Pine nuts. Peanuts. [place]. Salmon. Scallops. Shrimp. [place]. Tilapia. Clams. Oysters. Eggs. Dairy: Low-fat milk. Cheese. Greek yogurt. Beverages: Water. Red wine. Herbal tea. Fats and oils: Extra virgin olive oil. Avocado oil. Grape seed oil. Sweets and desserts: [place] yogurt with honey. Baked apples. Poached pears. Trail mix. Seasoning and other foods: Basil. Cilantro. Coriander. Cumin. Mint. Parsley. Sage. Rosemary. Tarragon.  Garlic. Oregano. Thyme. Pepper. Balsalmic vinegar. Tahini. Hummus. Tomato sauce. Olives. Mushrooms. Limit these Grains: Prepackaged pasta or rice dishes. Prepackaged cereal with added sugar. Vegetables: Deep fried potatoes (french fries). Fruits: Fruit canned in syrup. Meats and other protein foods: Beef. Pork. Lamb. Poultry with skin. Hot dogs. [place] Salines. Dairy: Ice cream. Sour cream. Whole milk. Beverages: Juice. Sugar-sweetened soft drinks. Beer. Liquor and spirits. Fats and oils: Butter. Canola oil. Vegetable oil. Beef fat (tallow). Lard. Sweets and desserts: Cookies. Cakes. Pies. Candy. Seasoning and other foods: Mayonnaise. Premade sauces and marinades. The items listed may not be a complete list. Talk with your dietitian about what dietary choices are right for you. Summary The Mediterranean diet includes both food and lifestyle choices. Eat a variety of fresh fruits and vegetables, beans, nuts, seeds, and whole grains. Limit the amount of red meat and sweets that you eat. Talk with your health care provider about whether it is safe for you to drink red wine in moderation. This means 1 glass a day for nonpregnant women and 2 glasses a day for men. A glass of wine equals 5 oz (150 mL). This information is not intended to replace advice given to  you by your health care provider. Make sure you discuss any questions you have with your health care provider. Document Released: 12/22/2015 Document Revised: 01/24/2016 Document Reviewed: 12/22/2015 Elsevier Interactive Patient Education  2017 [place] provider has requested that you have labwork completed today. Please go to [facility] ([id number]) on the second floor of this building before leaving the office today. You do not need to check in. If you are not called within 15 minutes please check with the front desk.

## 2022-01-08 NOTE — Progress Notes (Signed)
B12 and thyroid levels are normal, thanks

## 2022-01-11 DIAGNOSIS — E7800 Pure hypercholesterolemia, unspecified: Secondary | ICD-10-CM | POA: Diagnosis not present

## 2022-01-11 DIAGNOSIS — I10 Essential (primary) hypertension: Secondary | ICD-10-CM | POA: Diagnosis not present

## 2022-01-16 DIAGNOSIS — R413 Other amnesia: Secondary | ICD-10-CM | POA: Diagnosis not present

## 2022-01-16 DIAGNOSIS — R339 Retention of urine, unspecified: Secondary | ICD-10-CM | POA: Diagnosis not present

## 2022-01-16 DIAGNOSIS — N952 Postmenopausal atrophic vaginitis: Secondary | ICD-10-CM | POA: Diagnosis not present

## 2022-01-16 DIAGNOSIS — N3281 Overactive bladder: Secondary | ICD-10-CM | POA: Diagnosis not present

## 2022-01-18 DIAGNOSIS — E1142 Type 2 diabetes mellitus with diabetic polyneuropathy: Secondary | ICD-10-CM | POA: Diagnosis not present

## 2022-01-18 DIAGNOSIS — M47816 Spondylosis without myelopathy or radiculopathy, lumbar region: Secondary | ICD-10-CM | POA: Diagnosis not present

## 2022-01-23 DIAGNOSIS — E039 Hypothyroidism, unspecified: Secondary | ICD-10-CM | POA: Diagnosis not present

## 2022-01-23 DIAGNOSIS — N1831 Chronic kidney disease, stage 3a: Secondary | ICD-10-CM | POA: Diagnosis not present

## 2022-01-23 DIAGNOSIS — G63 Polyneuropathy in diseases classified elsewhere: Secondary | ICD-10-CM | POA: Diagnosis not present

## 2022-01-23 DIAGNOSIS — F332 Major depressive disorder, recurrent severe without psychotic features: Secondary | ICD-10-CM | POA: Diagnosis not present

## 2022-01-23 DIAGNOSIS — E785 Hyperlipidemia, unspecified: Secondary | ICD-10-CM | POA: Diagnosis not present

## 2022-01-23 DIAGNOSIS — Z0000 Encounter for general adult medical examination without abnormal findings: Secondary | ICD-10-CM | POA: Diagnosis not present

## 2022-01-23 DIAGNOSIS — F01A0 Vascular dementia, mild, without behavioral disturbance, psychotic disturbance, mood disturbance, and anxiety: Secondary | ICD-10-CM | POA: Diagnosis not present

## 2022-01-23 DIAGNOSIS — I10 Essential (primary) hypertension: Secondary | ICD-10-CM | POA: Diagnosis not present

## 2022-01-23 DIAGNOSIS — E1169 Type 2 diabetes mellitus with other specified complication: Secondary | ICD-10-CM | POA: Diagnosis not present

## 2022-01-26 NOTE — Progress Notes (Signed)
Remote pacemaker transmission.   

## 2022-01-30 ENCOUNTER — Other Ambulatory Visit: Payer: Self-pay

## 2022-01-30 MED ORDER — AMIODARONE HCL 200 MG PO TABS: 100.0000 mg | ORAL_TABLET | Freq: Every day | ORAL | 0 refills | Status: DC

## 2022-01-30 MED ORDER — METOPROLOL TARTRATE 25 MG PO TABS: 25.0000 mg | ORAL_TABLET | Freq: Two times a day (BID) | ORAL | 2 refills | Status: DC

## 2022-01-30 NOTE — Telephone Encounter (Signed)
Prescription refill request for Eliquis received. Indication:Afib Last office visit:7/23 Scr:1.9 Age: 88 Weight:77.1 kg  Under review for Eliquis dose reduction

## 2022-02-02 MED ORDER — APIXABAN 2.5 MG PO TABS: 2.5000 mg | ORAL_TABLET | Freq: Two times a day (BID) | ORAL | 1 refills | Status: DC

## 2022-02-02 NOTE — Telephone Encounter (Signed)
Last Scr per [facility] 1.5.  Recommend decrease to 2.'5mg'$  BID

## 2022-02-02 NOTE — Telephone Encounter (Addendum)
Age: 88 yo  Weight: 77.1 kg  Scr: 1.5, 01/23/2022  Called and spoke to pt's husband ( pt present) Informed them that Eliquis dose is changing to 2.'5mg'$  BID. When they pick up Eliquis it will be 2.'5mg'$  tablets and she is to take 1 tablet twice a day- about 12 hours apart.   Informed pt that she can break her current prescription of Eliquis ('5mg'$  tablets) in 1/2 to make 2.'5mg'$  tablets (Eliquis 1/2 a tablet, (2.'5mg'$ ) BID) until she picks up her new prescription.   Husband, who stated he handles pt's medication,  verbalized understanding.

## 2022-02-05 DIAGNOSIS — Z6829 Body mass index (BMI) 29.0-29.9, adult: Secondary | ICD-10-CM | POA: Diagnosis not present

## 2022-02-05 DIAGNOSIS — L03116 Cellulitis of left lower limb: Secondary | ICD-10-CM | POA: Diagnosis not present

## 2022-02-05 DIAGNOSIS — E663 Overweight: Secondary | ICD-10-CM | POA: Diagnosis not present

## 2022-02-20 DIAGNOSIS — I739 Peripheral vascular disease, unspecified: Secondary | ICD-10-CM | POA: Diagnosis not present

## 2022-02-20 DIAGNOSIS — E1151 Type 2 diabetes mellitus with diabetic peripheral angiopathy without gangrene: Secondary | ICD-10-CM | POA: Diagnosis not present

## 2022-02-20 DIAGNOSIS — L603 Nail dystrophy: Secondary | ICD-10-CM | POA: Diagnosis not present

## 2022-02-26 DIAGNOSIS — L570 Actinic keratosis: Secondary | ICD-10-CM | POA: Diagnosis not present

## 2022-02-26 DIAGNOSIS — L578 Other skin changes due to chronic exposure to nonionizing radiation: Secondary | ICD-10-CM | POA: Diagnosis not present

## 2022-02-26 DIAGNOSIS — L821 Other seborrheic keratosis: Secondary | ICD-10-CM | POA: Diagnosis not present

## 2022-03-27 ENCOUNTER — Ambulatory Visit (INDEPENDENT_AMBULATORY_CARE_PROVIDER_SITE_OTHER): Payer: Medicare HMO

## 2022-03-27 DIAGNOSIS — I495 Sick sinus syndrome: Secondary | ICD-10-CM | POA: Diagnosis not present

## 2022-03-27 LAB — CUP PACEART REMOTE DEVICE CHECK
Battery Remaining Longevity: 116 mo
Battery Voltage: 3.02 V
Brady Statistic AP VP Percent: 3.75 %
Brady Statistic AP VS Percent: 83.08 %
Brady Statistic AS VP Percent: 0.07 %
Brady Statistic AS VS Percent: 13.11 %
Brady Statistic RA Percent Paced: 87.69 %
Brady Statistic RV Percent Paced: 3.81 %
Date Time Interrogation Session: 20231113221929
Implantable Lead Connection Status: 753985
Implantable Lead Connection Status: 753985
Implantable Lead Implant Date: 20201124
Implantable Lead Implant Date: 20201124
Implantable Lead Location: 753859
Implantable Lead Location: 753860
Implantable Lead Model: 5076
Implantable Lead Model: 5076
Implantable Pulse Generator Implant Date: 20201124
Lead Channel Impedance Value: 285 Ohm
Lead Channel Impedance Value: 342 Ohm
Lead Channel Impedance Value: 342 Ohm
Lead Channel Impedance Value: 437 Ohm
Lead Channel Pacing Threshold Amplitude: 0.5 V
Lead Channel Pacing Threshold Amplitude: 0.75 V
Lead Channel Pacing Threshold Pulse Width: 0.4 ms
Lead Channel Pacing Threshold Pulse Width: 0.4 ms
Lead Channel Sensing Intrinsic Amplitude: 3 mV
Lead Channel Sensing Intrinsic Amplitude: 3 mV
Lead Channel Sensing Intrinsic Amplitude: 4.625 mV
Lead Channel Sensing Intrinsic Amplitude: 4.625 mV
Lead Channel Setting Pacing Amplitude: 1.5 V
Lead Channel Setting Pacing Amplitude: 2 V
Lead Channel Setting Pacing Pulse Width: 0.4 ms
Lead Channel Setting Sensing Sensitivity: 1.2 mV
Zone Setting Status: 755011
Zone Setting Status: 755011

## 2022-04-10 ENCOUNTER — Encounter: Payer: Self-pay | Admitting: Physician Assistant

## 2022-04-10 ENCOUNTER — Ambulatory Visit: Payer: Medicare HMO | Admitting: Physician Assistant

## 2022-04-10 VITALS — BP 136/79 | HR 76 | Resp 20 | Ht 67.0 in

## 2022-04-10 DIAGNOSIS — R413 Other amnesia: Secondary | ICD-10-CM | POA: Diagnosis not present

## 2022-04-10 NOTE — Progress Notes (Signed)
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 Patient is currently on memantine 10 mg twice daily and donepezil 10 mg daily as per PCP.  Personally reviewed CT of the head  09/24/2021, showed age-related cerebral atrophy  stable extensive dural calcifications.  In today's visit, the patient is overall stable from the cognitive standpoint.  Due to long drive of about 2 hours to make it to [facility] neurology, family is entertaining transferring her neurological care to [place].  For now, they are interested and very appreciative of returning here to our practice, and they will keep [place] informed of any changes.    Continue memantine 10 mg twice daily and donepezil 10 mg daily as per PCP Follow-up in 6 months unless plans change and they transferred care to [place] for geographical convenience Recommend good control of cardiovascular risk factors.   Continue to control mood as per PCP     Subjective:    This patient is accompanied in the office by her husband who supplements the history.  Previous records as well as any outside records available were reviewed prior to todays visit.  Last seen 01/09/2022, at which time her MoCA was 14/30   Any changes in memory since last visit?  "She is getting by".  She continues to forget recent conversations, and even her family's telephone numbers but not worse than before repeats oneself?  Endorsed Disoriented when walking into a room?  Patient denies   Leaving objects in unusual places?  She has some difficulty finding objects around the house.  "She forgets where she left them ". Ambulates  with difficulty?  She has a history of  osteoarthritis, limiting her ambulation, she does not like to use a cane. She never walks alone, uses the Rolator sometimes.  Recent falls?  [address] 2 times at [employer], mechanical fall.  Any head injuries?   Denies History of seizures?   Patient denies   Wandering behavior?  Patient denies .  Patient drives?   Patient no longer drives  Any mood changes since last visit?  Patient denies   Any worsening depression?:  Patient denies   Hallucinations?  Patient denies   Paranoia?  Patient denies   Patient reports that sleeps well without vivid dreams, REM behavior or sleepwalking   History of sleep apnea?  Patient denies   Any hygiene concerns?  Patient denies   Independent of bathing and dressing?  Endorsed  Does the patient needs help with medications?  They both do the medicines together Who is in charge of the finances?  They do the bills together Any changes in appetite?  Patient denies   Patient have trouble swallowing? Patient denies   Does the patient cook?  Only with supervision to prevent any kitchen accidents Any headaches?  Patient denies   Double vision? Patient denies   Any focal numbness or tingling?  Patient denies   Chronic back pain Patient denies   Unilateral weakness?  Patient denies   Any tremors?  Patient denies   Any history of anosmia?  Patient denies   Any incontinence of urine?  She has a history of OAB Any bowel dysfunction?   Patient denies  Patient lives with: husband   Initial visit 01/09/2022 How long did patient have memory difficulties?  She reports that her memory is worse over the last 6 months, especially forgetting some recent conversations, and even her family's telephone numbers.  Patient lives with: Spouse who noticed changes as well.   repeats oneself?  Endorsed. Disoriented when walking into a room?  Patient denies   Leaving objects in unusual places?  Her husband reports that even if he is at plate site, she may not be able to find  objects. Ambulates  with difficulty?   She has a history of osteoarthritis, which will limit her ambulation.  She does not like to use a cane. Recent falls?  Patient denies, but she did have a presyncopal episode as above. Any head injuries?Endorsed, heating "the back of my head ".  She had a presyncopal episode, but did not get to lose consciousness. History of seizures?   Patient denies   Wandering behavior?  Patient denies   Patient drives?   Patient no longer drives  Any mood changes such irritability agitation?  Patient denies   Any history of depression?:  Patient denies   Hallucinations?  Patient denies   Paranoia?  Patient denies   Patient reports that sleeps well without vivid dreams, REM behavior or sleepwalking    History of sleep apnea?  Patient denies   Any hygiene concerns?  Patient denies   Independent of bathing and dressing?  Endorsed  Does the patient needs help with medications? Both work together  Who is in charge of the finances?  Both worked together  Any changes in appetite?  Patient denies   Patient have trouble swallowing? Patient denies   Does the patient cook?  Patient denies   Any kitchen accidents such as leaving the stove on? Endorsed worse over the last 2 months. He is now supervising  Any headaches? Was at [facility] fell back after becoming dizzy, and also had a headache "in the back of the head. Double vision? Patient denies   Any focal numbness or tingling?  Patient denies   Chronic back pain Patient denies   Unilateral weakness?  Patient denies   Any tremors?  Patient denies   Any history of anosmia?  Patient denies   Any incontinence of urine?  She has a history of OAB,"same as always" Any bowel dysfunction?   Patient denies   History of heavy alcohol intake?  Patient denies   History of heavy tobacco use?  Patient denies   Family history of dementia?  Patient denies    Mother may have had dementia .    Labs 12/2021 B12 488, UA unremarkable, neg  CoVID, Hb 11.7, Hct 34.2, MCV 90,  Cr 1.5, Na133,    CT of the head without contrast was reviewed on 09/24/2021, showing age-related cerebral atrophy stable extensive dural calcifications.    PREVIOUS MEDICATIONS:b CURRENT MEDICATIONS:  Outpatient Encounter Medications as of 04/10/2022  Medication Sig   amiodarone (PACERONE) 200 MG tablet Take 0.5 tablets (100 mg total) by mouth daily.   amLODipine (NORVASC) 5 MG tablet Take 5 mg by mouth daily.   apixaban (ELIQUIS) 2.5 MG TABS tablet Take 1 tablet (2.5 mg total) by mouth 2 (two) times daily.   Ascorbic Acid (VITAMIN C PO) Take 1 tablet by mouth daily.   atorvastatin (LIPITOR) 20 MG tablet Take 20 mg by mouth daily.   Calcium Carbonate-Vitamin D (CALTRATE 600+D PO) Take 1 tablet by mouth daily.   Cholecalciferol (  VITAMIN D-3 PO) Take 1 capsule by mouth daily.   donepezil (ARICEPT) 10 MG tablet Take 10 mg by mouth daily.   EUTHYROX 25 MCG tablet Take 25 mcg by mouth daily.   gabapentin (NEURONTIN) 100 MG capsule Take 100 mg by mouth at bedtime.   losartan (COZAAR) 25 MG tablet Take 25 mg by mouth daily.   memantine (NAMENDA) 5 MG tablet Take 5 mg by mouth daily.   methocarbamol (ROBAXIN) 500 MG tablet Take 500 mg by mouth every 6 (six) hours as needed. For muscle spasms   metoprolol tartrate (LOPRESSOR) 25 MG tablet Take 1 tablet (25 mg total) by mouth 2 (two) times daily.   potassium chloride SA (KLOR-CON) 20 MEQ tablet Take 20 mEq by mouth as needed. To be taken when takes torsemide   sitaGLIPtin (JANUVIA) 100 MG tablet Take 50 mg by mouth daily.   torsemide (DEMADEX) 10 MG tablet Take 10 mg by mouth daily as needed for edema.   Vibegron 75 MG TABS Take 75 mg by mouth daily.   No facility-administered encounter medications on file as of 04/10/2022.        No data to display            01/09/2022    6:00 AM  Montreal Cognitive Assessment   Visuospatial/ Executive (0/5) 2  Naming (0/3) 2  Attention: Read list of digits (0/2) 1   Attention: Read list of letters (0/1) 0  Attention: Serial 7 subtraction starting at 100 (0/3) 0  Language: Repeat phrase (0/2) 1  Language : Fluency (0/1) 1  Abstraction (0/2) 2  Delayed Recall (0/5) 0  Orientation (0/6) 5  Total 14  Adjusted Score (based on education) 14    Objective:     PHYSICAL EXAMINATION:    VITALS:   Vitals:   04/10/22 1105  BP: 136/79  Pulse: 76  Resp: 20  SpO2: 100%  Height: '5\' 7"'$  (1.702 m)    GEN:  The patient appears stated age and is in NAD. HEENT:  Normocephalic, atraumatic.   Neurological examination:  General: NAD, well-groomed, appears stated age. Orientation: The patient is alert. Oriented to person, place and not to date Cranial nerves: There is good facial symmetry.The speech is fluent and clear. No aphasia or dysarthria. Fund of knowledge is appropriate. Recent and remote memory are impaired. Attention and concentration are reduced.  Able to name objects and repeat phrases.  Hearing is intact to conversational tone.    Sensation: Sensation is intact to light touch throughout Motor: Strength is at least antigravity x4. Tremors: none  DTR's 2/4 in UE/LE     Movement examination: Tone: There is normal tone in the UE/LE Abnormal movements:  no tremor.  No myoclonus.  No asterixis.   Coordination:  There is no decremation with RAM's. Normal finger to nose  Gait and Station: The patient has some difficulty arising out of a deep-seated chair without the use of the hands. The patient's stride length is good.  Gait is cautious and narrow has some difficulty due to arthritis.    Thank you for allowing [place] the opportunity to participate in the care of this nice patient. Please do not hesitate to contact us for any questions or concerns.   Total time spent on today's visit was 27 minutes dedicated to this patient today, preparing to see patient, examining the patient, ordering tests and/or medications and counseling the patient, documenting  clinical information in the EHR or other health record, independently interpreting  results and communicating results to the patient/family, discussing treatment and goals, answering patient's questions and coordinating care.  Cc:  Street, Sharon Mt, MD  Sharene Butters 04/10/2022 12:02 PM

## 2022-04-10 NOTE — Patient Instructions (Signed)
It was a pleasure to see you today at our office.   Recommendations:  Continue Memantine and Donepezil as per primary doctor  Follow up in 6 months    Whom to call:  Memory  decline, memory medications: Call our office [phone]   For psychiatric meds, mood meds: Please have your primary care physician manage these medications.   Counseling regarding caregiver distress, including caregiver depression, anxiety and issues regarding community resources, adult day care programs, adult living facilities, or memory care questions:   Feel free to contact [place], Social Worker at [phone]   For assessment of decision of mental capacity and competency:  Call Dr. Anthoney Harada, geriatric psychiatrist at [phone]  For guidance in geriatric dementia issues please call [facility] [phone]   If you have any severe symptoms of a stroke, or other severe issues such as confusion,severe chills or fever, etc call 911 or go to the ER as you may need to be evaluated further   Feel free to visit Facebook page " [email or web address]" for tips of how to care for people with memory problems.        RECOMMENDATIONS FOR ALL PATIENTS WITH MEMORY PROBLEMS: 1. Continue to exercise (Recommend 30 minutes of walking everyday, or 3 hours every week) 2. Increase social interactions - continue going to [place] and enjoy social gatherings with friends and family 3. Eat healthy, avoid fried foods and eat more fruits and vegetables 4. Maintain adequate blood pressure, blood sugar, and blood cholesterol level. Reducing the risk of stroke and cardiovascular disease also helps promoting better memory. 5. Avoid stressful situations. Live a simple life and avoid aggravations. Organize your time and prepare for the next day in anticipation. 6. Sleep well, avoid any interruptions of sleep and avoid any distractions in the bedroom that may interfere with adequate sleep quality 7. Avoid sugar, avoid  sweets as there is a strong link between excessive sugar intake, diabetes, and cognitive impairment We discussed the Mediterranean diet, which has been shown to help patients reduce the risk of progressive memory disorders and reduces cardiovascular risk. This includes eating fish, eat fruits and green leafy vegetables, nuts like almonds and hazelnuts, walnuts, and also use olive oil. Avoid fast foods and fried foods as much as possible. Avoid sweets and sugar as sugar use has been linked to worsening of memory function.  There is always a concern of gradual progression of memory problems. If this is the case, then we may need to adjust level of care according to patient needs. Support, both to the patient and caregiver, should then be put into place.         FALL PRECAUTIONS: Be cautious when walking. Scan the area for obstacles that may increase the risk of trips and falls. When getting up in the mornings, sit up at the edge of the bed for a few minutes before getting out of bed. Consider elevating the bed at the head end to avoid drop of blood pressure when getting up. Walk always in a well-lit room (use night lights in the walls). Avoid area rugs or power cords from appliances in the middle of the walkways. Use a walker or a cane if necessary and consider physical therapy for balance exercise. Get your eyesight checked regularly.  FINANCIAL OVERSIGHT: Supervision, especially oversight when making financial decisions or transactions is also recommended.  HOME SAFETY: Consider the safety of the kitchen when operating appliances like stoves, microwave oven, and blender. Consider having supervision  and share cooking responsibilities until no longer able to participate in those. Accidents with firearms and other hazards in the house should be identified and addressed as well.   ABILITY TO BE LEFT ALONE: If patient is unable to contact 911 operator, consider using LifeLine, or when the need is there,  arrange for someone to stay with patients. Smoking is a fire hazard, consider supervision or cessation. Risk of wandering should be assessed by caregiver and if detected at any point, supervision and safe proof recommendations should be instituted.  MEDICATION SUPERVISION: Inability to self-administer medication needs to be constantly addressed. Implement a mechanism to ensure safe administration of the medications.   DRIVING: Regarding driving, in patients with progressive memory problems, driving will be impaired. We advise to have someone else do the driving if trouble finding directions or if minor accidents are reported. Independent driving assessment is available to determine safety of driving.   If you are interested in the driving assessment, you can contact the following:  [employer] in [place]  [place] [phone] or [phone]    [place] refers to food and lifestyle choices that are based on the traditions of countries located on the [employer]. This way of eating has been shown to help prevent certain conditions and improve outcomes for people who have chronic diseases, like kidney disease and heart disease. What are tips for following this plan? Lifestyle  Cook and eat meals together with your family, when possible. Drink enough fluid to keep your urine clear or pale yellow. Be physically active every day. This includes: Aerobic exercise like running or swimming. Leisure activities like gardening, walking, or housework. Get 7-8 hours of sleep each night. If recommended by your health care provider, drink red wine in moderation. This means 1 glass a day for nonpregnant women and 2 glasses a day for men. A glass of wine equals 5 oz (150 mL). Reading food labels  Check the serving size of packaged foods. For foods  such as rice and pasta, the serving size refers to the amount of cooked product, not dry. Check the total fat in packaged foods. Avoid foods that have saturated fat or trans fats. Check the ingredients list for added sugars, such as corn syrup. Shopping  At the grocery store, buy most of your food from the areas near the walls of the store. This includes: Fresh fruits and vegetables (produce). Grains, beans, nuts, and seeds. Some of these may be available in unpackaged forms or large amounts (in bulk). Fresh seafood. Poultry and eggs. Low-fat dairy products. Buy whole ingredients instead of prepackaged foods. Buy fresh fruits and vegetables in-season from local farmers markets. Buy frozen fruits and vegetables in resealable bags. If you do not have access to quality fresh seafood, buy precooked frozen shrimp or canned fish, such as tuna, salmon, or sardines. Buy small amounts of raw or cooked vegetables, salads, or olives from the deli or salad bar at your store. Stock your pantry so you always have certain foods on hand, such as olive oil, canned tuna, canned tomatoes, rice, pasta, and beans. Cooking  Cook foods with extra-virgin olive oil instead of using butter or other vegetable oils. Have meat as a side dish, and have vegetables or grains as your main dish. This means having meat in small portions or adding small amounts of meat to foods like pasta or stew. Use beans or  vegetables instead of meat in common dishes like chili or lasagna. Experiment with different cooking methods. Try roasting or broiling vegetables instead of steaming or sauteing them. Add frozen vegetables to soups, stews, pasta, or rice. Add nuts or seeds for added healthy fat at each meal. You can add these to yogurt, salads, or vegetable dishes. Marinate fish or vegetables using olive oil, lemon juice, garlic, and fresh herbs. Meal planning  Plan to eat 1 vegetarian meal one day each week. Try to work up to 2  vegetarian meals, if possible. Eat seafood 2 or more times a week. Have healthy snacks readily available, such as: Vegetable sticks with hummus. Greek yogurt. Fruit and nut trail mix. Eat balanced meals throughout the week. This includes: Fruit: 2-3 servings a day Vegetables: 4-5 servings a day Low-fat dairy: 2 servings a day Fish, poultry, or lean meat: 1 serving a day Beans and legumes: 2 or more servings a week Nuts and seeds: 1-2 servings a day Whole grains: 6-8 servings a day Extra-virgin olive oil: 3-4 servings a day Limit red meat and sweets to only a few servings a month What are my food choices? Mediterranean diet Recommended Grains: Whole-grain pasta. Brown rice. Bulgar wheat. Polenta. Couscous. Whole-wheat bread. [place]. Vegetables: Artichokes. Beets. Broccoli. Cabbage. Carrots. Eggplant. Green beans. Chard. Kale. Spinach. Onions. Leeks. Peas. Squash. Tomatoes. Peppers. Radishes. Fruits: Apples. Apricots. Avocado. Berries. Bananas. Cherries. Dates. Figs. Grapes. Lemons. Melon. Oranges. Peaches. Plums. Pomegranate. Meats and other protein foods: Beans. Almonds. Sunflower seeds. Pine nuts. Peanuts. [place]. Salmon. Scallops. Shrimp. [place]. Tilapia. Clams. Oysters. Eggs. Dairy: Low-fat milk. Cheese. Greek yogurt. Beverages: Water. Red wine. Herbal tea. Fats and oils: Extra virgin olive oil. Avocado oil. Grape seed oil. Sweets and desserts: [place] yogurt with honey. Baked apples. Poached pears. Trail mix. Seasoning and other foods: Basil. Cilantro. Coriander. Cumin. Mint. Parsley. Sage. Rosemary. Tarragon. Garlic. Oregano. Thyme. Pepper. Balsalmic vinegar. Tahini. Hummus. Tomato sauce. Olives. Mushrooms. Limit these Grains: Prepackaged pasta or rice dishes. Prepackaged cereal with added sugar. Vegetables: Deep fried potatoes (french fries). Fruits: Fruit canned in syrup. Meats and other protein foods: Beef. Pork. Lamb. Poultry with skin. Hot dogs. [place] Salines. Dairy: Ice cream.  Sour cream. Whole milk. Beverages: Juice. Sugar-sweetened soft drinks. Beer. Liquor and spirits. Fats and oils: Butter. Canola oil. Vegetable oil. Beef fat (tallow). Lard. Sweets and desserts: Cookies. Cakes. Pies. Candy. Seasoning and other foods: Mayonnaise. Premade sauces and marinades. The items listed may not be a complete list. Talk with your dietitian about what dietary choices are right for you. Summary The Mediterranean diet includes both food and lifestyle choices. Eat a variety of fresh fruits and vegetables, beans, nuts, seeds, and whole grains. Limit the amount of red meat and sweets that you eat. Talk with your health care provider about whether it is safe for you to drink red wine in moderation. This means 1 glass a day for nonpregnant women and 2 glasses a day for men. A glass of wine equals 5 oz (150 mL). This information is not intended to replace advice given to you by your health care provider. Make sure you discuss any questions you have with your health care provider. Document Released: 12/22/2015 Document Revised: 01/24/2016 Document Reviewed: 12/22/2015 Elsevier Interactive Patient Education  2017 [place] provider has requested that you have labwork completed today. Please go to [facility] ([id number]) on the second floor of this building before leaving the office today. You do not need to check in.  If you are not called within 15 minutes please check with the front desk.

## 2022-04-13 ENCOUNTER — Telehealth: Payer: Self-pay | Admitting: Cardiology

## 2022-04-13 NOTE — Telephone Encounter (Signed)
Spoke with Elmyra Ricks and advised that due to creatine at last visit and age dose was decreased to 2.5 mg BID. Elmyra Ricks verbalized understanding and had no additional questions.

## 2022-04-13 NOTE — Telephone Encounter (Signed)
Pt c/o medication issue:  1. Name of Medication: apixaban (ELIQUIS) 2.5 MG TABS tablet   2. How are you currently taking this medication (dosage and times per day)? 1 tablet twice a day  3. Are you having a reaction (difficulty breathing--STAT)? no  4. What is your medication issue? Sabrina Hunt with [facility] calling to verify dose of eliquis. She says the patient's PCP has it listed as 5 mg, but the patient has 2.5 mg. Phone: [phone]

## 2022-04-24 NOTE — Progress Notes (Signed)
Remote pacemaker transmission.   

## 2022-04-26 DIAGNOSIS — L603 Nail dystrophy: Secondary | ICD-10-CM | POA: Diagnosis not present

## 2022-04-26 DIAGNOSIS — E1151 Type 2 diabetes mellitus with diabetic peripheral angiopathy without gangrene: Secondary | ICD-10-CM | POA: Diagnosis not present

## 2022-04-26 DIAGNOSIS — I739 Peripheral vascular disease, unspecified: Secondary | ICD-10-CM | POA: Diagnosis not present

## 2022-04-30 DIAGNOSIS — E039 Hypothyroidism, unspecified: Secondary | ICD-10-CM | POA: Diagnosis not present

## 2022-04-30 DIAGNOSIS — F02A0 Dementia in other diseases classified elsewhere, mild, without behavioral disturbance, psychotic disturbance, mood disturbance, and anxiety: Secondary | ICD-10-CM | POA: Diagnosis not present

## 2022-05-25 DIAGNOSIS — R918 Other nonspecific abnormal finding of lung field: Secondary | ICD-10-CM | POA: Diagnosis not present

## 2022-05-25 DIAGNOSIS — J4520 Mild intermittent asthma, uncomplicated: Secondary | ICD-10-CM | POA: Diagnosis not present

## 2022-06-13 DIAGNOSIS — N3281 Overactive bladder: Secondary | ICD-10-CM | POA: Diagnosis not present

## 2022-06-13 DIAGNOSIS — I498 Other specified cardiac arrhythmias: Secondary | ICD-10-CM | POA: Diagnosis not present

## 2022-06-13 DIAGNOSIS — I10 Essential (primary) hypertension: Secondary | ICD-10-CM | POA: Diagnosis not present

## 2022-06-13 DIAGNOSIS — M8580 Other specified disorders of bone density and structure, unspecified site: Secondary | ICD-10-CM | POA: Diagnosis not present

## 2022-06-22 ENCOUNTER — Other Ambulatory Visit: Payer: Self-pay | Admitting: Cardiology

## 2022-06-22 NOTE — Telephone Encounter (Signed)
Rx refill sent to pharmacy. 

## 2022-06-26 ENCOUNTER — Ambulatory Visit: Payer: Medicare HMO

## 2022-06-26 DIAGNOSIS — I495 Sick sinus syndrome: Secondary | ICD-10-CM

## 2022-06-26 LAB — CUP PACEART REMOTE DEVICE CHECK
Battery Remaining Longevity: 114 mo
Battery Voltage: 3.02 V
Brady Statistic AP VP Percent: 7.57 %
Brady Statistic AP VS Percent: 89.65 %
Brady Statistic AS VP Percent: 0.08 %
Brady Statistic AS VS Percent: 2.69 %
Brady Statistic RA Percent Paced: 97.33 %
Brady Statistic RV Percent Paced: 7.65 %
Date Time Interrogation Session: 20240212043233
Implantable Lead Connection Status: 753985
Implantable Lead Connection Status: 753985
Implantable Lead Implant Date: 20201124
Implantable Lead Implant Date: 20201124
Implantable Lead Location: 753859
Implantable Lead Location: 753860
Implantable Lead Model: 5076
Implantable Lead Model: 5076
Implantable Pulse Generator Implant Date: 20201124
Lead Channel Impedance Value: 323 Ohm
Lead Channel Impedance Value: 361 Ohm
Lead Channel Impedance Value: 361 Ohm
Lead Channel Impedance Value: 418 Ohm
Lead Channel Pacing Threshold Amplitude: 0.5 V
Lead Channel Pacing Threshold Amplitude: 0.75 V
Lead Channel Pacing Threshold Pulse Width: 0.4 ms
Lead Channel Pacing Threshold Pulse Width: 0.4 ms
Lead Channel Sensing Intrinsic Amplitude: 2.5 mV
Lead Channel Sensing Intrinsic Amplitude: 2.5 mV
Lead Channel Sensing Intrinsic Amplitude: 4.5 mV
Lead Channel Sensing Intrinsic Amplitude: 4.5 mV
Lead Channel Setting Pacing Amplitude: 1.5 V
Lead Channel Setting Pacing Amplitude: 2 V
Lead Channel Setting Pacing Pulse Width: 0.4 ms
Lead Channel Setting Sensing Sensitivity: 1.2 mV
Zone Setting Status: 755011
Zone Setting Status: 755011

## 2022-07-02 DIAGNOSIS — L570 Actinic keratosis: Secondary | ICD-10-CM | POA: Diagnosis not present

## 2022-07-02 DIAGNOSIS — L578 Other skin changes due to chronic exposure to nonionizing radiation: Secondary | ICD-10-CM | POA: Diagnosis not present

## 2022-07-02 DIAGNOSIS — L821 Other seborrheic keratosis: Secondary | ICD-10-CM | POA: Diagnosis not present

## 2022-07-02 DIAGNOSIS — C44722 Squamous cell carcinoma of skin of right lower limb, including hip: Secondary | ICD-10-CM | POA: Diagnosis not present

## 2022-07-04 DIAGNOSIS — I739 Peripheral vascular disease, unspecified: Secondary | ICD-10-CM | POA: Diagnosis not present

## 2022-07-04 DIAGNOSIS — E1151 Type 2 diabetes mellitus with diabetic peripheral angiopathy without gangrene: Secondary | ICD-10-CM | POA: Diagnosis not present

## 2022-07-04 DIAGNOSIS — L603 Nail dystrophy: Secondary | ICD-10-CM | POA: Diagnosis not present

## 2022-07-05 DIAGNOSIS — Z719 Counseling, unspecified: Secondary | ICD-10-CM | POA: Diagnosis not present

## 2022-07-05 DIAGNOSIS — F0390 Unspecified dementia without behavioral disturbance: Secondary | ICD-10-CM | POA: Diagnosis not present

## 2022-07-09 DIAGNOSIS — C44722 Squamous cell carcinoma of skin of right lower limb, including hip: Secondary | ICD-10-CM | POA: Diagnosis not present

## 2022-07-18 DIAGNOSIS — N952 Postmenopausal atrophic vaginitis: Secondary | ICD-10-CM | POA: Diagnosis not present

## 2022-07-18 DIAGNOSIS — R413 Other amnesia: Secondary | ICD-10-CM | POA: Diagnosis not present

## 2022-07-18 DIAGNOSIS — N3281 Overactive bladder: Secondary | ICD-10-CM | POA: Diagnosis not present

## 2022-07-18 DIAGNOSIS — R339 Retention of urine, unspecified: Secondary | ICD-10-CM | POA: Diagnosis not present

## 2022-07-27 NOTE — Progress Notes (Signed)
Remote pacemaker transmission.   

## 2022-08-14 DIAGNOSIS — F01A0 Vascular dementia, mild, without behavioral disturbance, psychotic disturbance, mood disturbance, and anxiety: Secondary | ICD-10-CM | POA: Diagnosis not present

## 2022-08-14 DIAGNOSIS — G63 Polyneuropathy in diseases classified elsewhere: Secondary | ICD-10-CM | POA: Diagnosis not present

## 2022-08-14 DIAGNOSIS — I700 Atherosclerosis of aorta: Secondary | ICD-10-CM | POA: Diagnosis not present

## 2022-08-14 DIAGNOSIS — N3281 Overactive bladder: Secondary | ICD-10-CM | POA: Diagnosis not present

## 2022-08-14 DIAGNOSIS — E785 Hyperlipidemia, unspecified: Secondary | ICD-10-CM | POA: Diagnosis not present

## 2022-08-14 DIAGNOSIS — I498 Other specified cardiac arrhythmias: Secondary | ICD-10-CM | POA: Diagnosis not present

## 2022-08-14 DIAGNOSIS — D6869 Other thrombophilia: Secondary | ICD-10-CM | POA: Diagnosis not present

## 2022-08-14 DIAGNOSIS — E1169 Type 2 diabetes mellitus with other specified complication: Secondary | ICD-10-CM | POA: Diagnosis not present

## 2022-08-14 DIAGNOSIS — R2681 Unsteadiness on feet: Secondary | ICD-10-CM | POA: Diagnosis not present

## 2022-08-14 DIAGNOSIS — R296 Repeated falls: Secondary | ICD-10-CM | POA: Diagnosis not present

## 2022-08-14 DIAGNOSIS — F332 Major depressive disorder, recurrent severe without psychotic features: Secondary | ICD-10-CM | POA: Diagnosis not present

## 2022-08-14 DIAGNOSIS — N1831 Chronic kidney disease, stage 3a: Secondary | ICD-10-CM | POA: Diagnosis not present

## 2022-08-14 DIAGNOSIS — M6281 Muscle weakness (generalized): Secondary | ICD-10-CM | POA: Diagnosis not present

## 2022-08-27 ENCOUNTER — Other Ambulatory Visit: Payer: Self-pay | Admitting: Cardiology

## 2022-08-27 NOTE — Telephone Encounter (Signed)
[place] patient

## 2022-08-27 NOTE — Telephone Encounter (Signed)
Prescription refill request for Eliquis received. Indication: PAF Last office visit: 11/22/21  R Revankar MD Scr: 1.9 on 01/23/22 Age: 89 Weight: 77.4kg  Based on above findings Eliquis 2.5mg  twice daily is the appropriate dose.  Refill approvedx 1.  Pt is due to see Dr Tomie China.  Message sent to schedulers to make appt.

## 2022-09-06 DIAGNOSIS — L603 Nail dystrophy: Secondary | ICD-10-CM | POA: Diagnosis not present

## 2022-09-06 DIAGNOSIS — I739 Peripheral vascular disease, unspecified: Secondary | ICD-10-CM | POA: Diagnosis not present

## 2022-09-06 DIAGNOSIS — E1151 Type 2 diabetes mellitus with diabetic peripheral angiopathy without gangrene: Secondary | ICD-10-CM | POA: Diagnosis not present

## 2022-09-11 DIAGNOSIS — I10 Essential (primary) hypertension: Secondary | ICD-10-CM | POA: Diagnosis not present

## 2022-09-11 DIAGNOSIS — F01A0 Vascular dementia, mild, without behavioral disturbance, psychotic disturbance, mood disturbance, and anxiety: Secondary | ICD-10-CM | POA: Diagnosis not present

## 2022-09-11 DIAGNOSIS — E039 Hypothyroidism, unspecified: Secondary | ICD-10-CM | POA: Diagnosis not present

## 2022-09-17 DIAGNOSIS — R531 Weakness: Secondary | ICD-10-CM | POA: Diagnosis not present

## 2022-09-17 DIAGNOSIS — S72002A Fracture of unspecified part of neck of left femur, initial encounter for closed fracture: Secondary | ICD-10-CM | POA: Diagnosis not present

## 2022-09-17 DIAGNOSIS — R0602 Shortness of breath: Secondary | ICD-10-CM | POA: Diagnosis not present

## 2022-09-17 DIAGNOSIS — R410 Disorientation, unspecified: Secondary | ICD-10-CM | POA: Diagnosis not present

## 2022-09-17 DIAGNOSIS — E1122 Type 2 diabetes mellitus with diabetic chronic kidney disease: Secondary | ICD-10-CM | POA: Diagnosis not present

## 2022-09-17 DIAGNOSIS — E785 Hyperlipidemia, unspecified: Secondary | ICD-10-CM | POA: Diagnosis not present

## 2022-09-17 DIAGNOSIS — Z043 Encounter for examination and observation following other accident: Secondary | ICD-10-CM | POA: Diagnosis not present

## 2022-09-17 DIAGNOSIS — S72142A Displaced intertrochanteric fracture of left femur, initial encounter for closed fracture: Secondary | ICD-10-CM | POA: Diagnosis not present

## 2022-09-17 DIAGNOSIS — N179 Acute kidney failure, unspecified: Secondary | ICD-10-CM | POA: Diagnosis not present

## 2022-09-17 DIAGNOSIS — W19XXXA Unspecified fall, initial encounter: Secondary | ICD-10-CM | POA: Diagnosis not present

## 2022-09-17 DIAGNOSIS — E871 Hypo-osmolality and hyponatremia: Secondary | ICD-10-CM | POA: Diagnosis not present

## 2022-09-17 DIAGNOSIS — S0990XA Unspecified injury of head, initial encounter: Secondary | ICD-10-CM | POA: Diagnosis not present

## 2022-09-17 DIAGNOSIS — M25552 Pain in left hip: Secondary | ICD-10-CM | POA: Diagnosis not present

## 2022-09-17 DIAGNOSIS — I450 Right fascicular block: Secondary | ICD-10-CM | POA: Diagnosis not present

## 2022-09-17 DIAGNOSIS — E119 Type 2 diabetes mellitus without complications: Secondary | ICD-10-CM | POA: Diagnosis not present

## 2022-09-17 DIAGNOSIS — Z4789 Encounter for other orthopedic aftercare: Secondary | ICD-10-CM | POA: Diagnosis not present

## 2022-09-17 DIAGNOSIS — S72145A Nondisplaced intertrochanteric fracture of left femur, initial encounter for closed fracture: Secondary | ICD-10-CM | POA: Diagnosis not present

## 2022-09-17 DIAGNOSIS — N189 Chronic kidney disease, unspecified: Secondary | ICD-10-CM | POA: Diagnosis not present

## 2022-09-17 DIAGNOSIS — D509 Iron deficiency anemia, unspecified: Secondary | ICD-10-CM | POA: Diagnosis not present

## 2022-09-17 DIAGNOSIS — I498 Other specified cardiac arrhythmias: Secondary | ICD-10-CM | POA: Diagnosis not present

## 2022-09-17 DIAGNOSIS — F0390 Unspecified dementia without behavioral disturbance: Secondary | ICD-10-CM | POA: Diagnosis not present

## 2022-09-17 DIAGNOSIS — I129 Hypertensive chronic kidney disease with stage 1 through stage 4 chronic kidney disease, or unspecified chronic kidney disease: Secondary | ICD-10-CM | POA: Diagnosis not present

## 2022-09-17 DIAGNOSIS — E039 Hypothyroidism, unspecified: Secondary | ICD-10-CM | POA: Diagnosis not present

## 2022-09-17 DIAGNOSIS — E860 Dehydration: Secondary | ICD-10-CM | POA: Diagnosis not present

## 2022-09-17 DIAGNOSIS — R2681 Unsteadiness on feet: Secondary | ICD-10-CM | POA: Diagnosis not present

## 2022-09-17 DIAGNOSIS — J9811 Atelectasis: Secondary | ICD-10-CM | POA: Diagnosis not present

## 2022-09-17 DIAGNOSIS — S79912A Unspecified injury of left hip, initial encounter: Secondary | ICD-10-CM | POA: Diagnosis not present

## 2022-09-17 DIAGNOSIS — Z01818 Encounter for other preprocedural examination: Secondary | ICD-10-CM | POA: Diagnosis not present

## 2022-09-17 DIAGNOSIS — F32A Depression, unspecified: Secondary | ICD-10-CM | POA: Diagnosis not present

## 2022-09-17 DIAGNOSIS — M6281 Muscle weakness (generalized): Secondary | ICD-10-CM | POA: Diagnosis not present

## 2022-09-17 DIAGNOSIS — D62 Acute posthemorrhagic anemia: Secondary | ICD-10-CM | POA: Diagnosis not present

## 2022-09-17 DIAGNOSIS — I10 Essential (primary) hypertension: Secondary | ICD-10-CM | POA: Diagnosis not present

## 2022-09-17 DIAGNOSIS — R609 Edema, unspecified: Secondary | ICD-10-CM | POA: Diagnosis not present

## 2022-09-17 DIAGNOSIS — R339 Retention of urine, unspecified: Secondary | ICD-10-CM | POA: Diagnosis not present

## 2022-09-17 DIAGNOSIS — I444 Left anterior fascicular block: Secondary | ICD-10-CM | POA: Diagnosis not present

## 2022-09-17 DIAGNOSIS — E0800 Diabetes mellitus due to underlying condition with hyperosmolarity without nonketotic hyperglycemic-hyperosmolar coma (NKHHC): Secondary | ICD-10-CM | POA: Diagnosis not present

## 2022-09-17 DIAGNOSIS — N3281 Overactive bladder: Secondary | ICD-10-CM | POA: Diagnosis not present

## 2022-09-18 DIAGNOSIS — I10 Essential (primary) hypertension: Secondary | ICD-10-CM | POA: Diagnosis not present

## 2022-09-18 DIAGNOSIS — Z01818 Encounter for other preprocedural examination: Secondary | ICD-10-CM | POA: Diagnosis not present

## 2022-09-18 DIAGNOSIS — S72145A Nondisplaced intertrochanteric fracture of left femur, initial encounter for closed fracture: Secondary | ICD-10-CM | POA: Diagnosis not present

## 2022-09-18 DIAGNOSIS — S72142A Displaced intertrochanteric fracture of left femur, initial encounter for closed fracture: Secondary | ICD-10-CM | POA: Diagnosis not present

## 2022-09-18 DIAGNOSIS — W19XXXA Unspecified fall, initial encounter: Secondary | ICD-10-CM | POA: Diagnosis not present

## 2022-09-21 DIAGNOSIS — J9811 Atelectasis: Secondary | ICD-10-CM | POA: Diagnosis not present

## 2022-09-21 DIAGNOSIS — R0602 Shortness of breath: Secondary | ICD-10-CM | POA: Diagnosis not present

## 2022-09-28 ENCOUNTER — Telehealth: Payer: Self-pay

## 2022-09-28 ENCOUNTER — Telehealth: Payer: Self-pay | Admitting: Cardiology

## 2022-09-28 DIAGNOSIS — E039 Hypothyroidism, unspecified: Secondary | ICD-10-CM | POA: Diagnosis not present

## 2022-09-28 DIAGNOSIS — R609 Edema, unspecified: Secondary | ICD-10-CM | POA: Diagnosis not present

## 2022-09-28 DIAGNOSIS — G8918 Other acute postprocedural pain: Secondary | ICD-10-CM | POA: Diagnosis not present

## 2022-09-28 DIAGNOSIS — D62 Acute posthemorrhagic anemia: Secondary | ICD-10-CM | POA: Diagnosis not present

## 2022-09-28 DIAGNOSIS — N3281 Overactive bladder: Secondary | ICD-10-CM | POA: Diagnosis not present

## 2022-09-28 DIAGNOSIS — E785 Hyperlipidemia, unspecified: Secondary | ICD-10-CM | POA: Diagnosis not present

## 2022-09-28 DIAGNOSIS — K219 Gastro-esophageal reflux disease without esophagitis: Secondary | ICD-10-CM | POA: Diagnosis not present

## 2022-09-28 DIAGNOSIS — E0800 Diabetes mellitus due to underlying condition with hyperosmolarity without nonketotic hyperglycemic-hyperosmolar coma (NKHHC): Secondary | ICD-10-CM | POA: Diagnosis not present

## 2022-09-28 DIAGNOSIS — R410 Disorientation, unspecified: Secondary | ICD-10-CM | POA: Diagnosis not present

## 2022-09-28 DIAGNOSIS — F32A Depression, unspecified: Secondary | ICD-10-CM | POA: Diagnosis not present

## 2022-09-28 DIAGNOSIS — E871 Hypo-osmolality and hyponatremia: Secondary | ICD-10-CM | POA: Diagnosis not present

## 2022-09-28 DIAGNOSIS — W19XXXA Unspecified fall, initial encounter: Secondary | ICD-10-CM | POA: Diagnosis not present

## 2022-09-28 DIAGNOSIS — M25552 Pain in left hip: Secondary | ICD-10-CM | POA: Diagnosis not present

## 2022-09-28 DIAGNOSIS — S72145A Nondisplaced intertrochanteric fracture of left femur, initial encounter for closed fracture: Secondary | ICD-10-CM | POA: Diagnosis not present

## 2022-09-28 DIAGNOSIS — I959 Hypotension, unspecified: Secondary | ICD-10-CM | POA: Diagnosis not present

## 2022-09-28 DIAGNOSIS — M6281 Muscle weakness (generalized): Secondary | ICD-10-CM | POA: Diagnosis not present

## 2022-09-28 DIAGNOSIS — R2681 Unsteadiness on feet: Secondary | ICD-10-CM | POA: Diagnosis not present

## 2022-09-28 DIAGNOSIS — Z4789 Encounter for other orthopedic aftercare: Secondary | ICD-10-CM | POA: Diagnosis not present

## 2022-09-28 DIAGNOSIS — F0390 Unspecified dementia without behavioral disturbance: Secondary | ICD-10-CM | POA: Diagnosis not present

## 2022-09-28 DIAGNOSIS — Z7982 Long term (current) use of aspirin: Secondary | ICD-10-CM | POA: Diagnosis not present

## 2022-09-28 DIAGNOSIS — S72142D Displaced intertrochanteric fracture of left femur, subsequent encounter for closed fracture with routine healing: Secondary | ICD-10-CM | POA: Diagnosis not present

## 2022-09-28 DIAGNOSIS — R531 Weakness: Secondary | ICD-10-CM | POA: Diagnosis not present

## 2022-09-28 DIAGNOSIS — I10 Essential (primary) hypertension: Secondary | ICD-10-CM | POA: Diagnosis not present

## 2022-09-28 DIAGNOSIS — Z79899 Other long term (current) drug therapy: Secondary | ICD-10-CM | POA: Diagnosis not present

## 2022-09-28 NOTE — Telephone Encounter (Signed)
Caller stated patient has been admitted to [facility] with a broken him and is unable to do device transmissions. Can follow-up with patient's spouse for further information.

## 2022-09-28 NOTE — Telephone Encounter (Signed)
LMOVM letting patient know we got the message she is in the hospital and can send missed remote check when she get out.

## 2022-10-01 ENCOUNTER — Telehealth: Payer: Self-pay | Admitting: Cardiology

## 2022-10-01 DIAGNOSIS — E871 Hypo-osmolality and hyponatremia: Secondary | ICD-10-CM | POA: Diagnosis not present

## 2022-10-01 DIAGNOSIS — E039 Hypothyroidism, unspecified: Secondary | ICD-10-CM | POA: Diagnosis not present

## 2022-10-01 DIAGNOSIS — S72142D Displaced intertrochanteric fracture of left femur, subsequent encounter for closed fracture with routine healing: Secondary | ICD-10-CM | POA: Diagnosis not present

## 2022-10-01 DIAGNOSIS — F0390 Unspecified dementia without behavioral disturbance: Secondary | ICD-10-CM | POA: Diagnosis not present

## 2022-10-01 DIAGNOSIS — K219 Gastro-esophageal reflux disease without esophagitis: Secondary | ICD-10-CM | POA: Diagnosis not present

## 2022-10-01 DIAGNOSIS — I10 Essential (primary) hypertension: Secondary | ICD-10-CM | POA: Diagnosis not present

## 2022-10-01 DIAGNOSIS — N3281 Overactive bladder: Secondary | ICD-10-CM | POA: Diagnosis not present

## 2022-10-01 DIAGNOSIS — E785 Hyperlipidemia, unspecified: Secondary | ICD-10-CM | POA: Diagnosis not present

## 2022-10-01 NOTE — Telephone Encounter (Signed)
Caller (neighbor A. Hussey) with husband stated patient is currently in rehab and will schedule f/u visit when out of rehab.

## 2022-10-02 DIAGNOSIS — Z79899 Other long term (current) drug therapy: Secondary | ICD-10-CM | POA: Diagnosis not present

## 2022-10-02 DIAGNOSIS — G8918 Other acute postprocedural pain: Secondary | ICD-10-CM | POA: Diagnosis not present

## 2022-10-02 DIAGNOSIS — E871 Hypo-osmolality and hyponatremia: Secondary | ICD-10-CM | POA: Diagnosis not present

## 2022-10-02 DIAGNOSIS — K219 Gastro-esophageal reflux disease without esophagitis: Secondary | ICD-10-CM | POA: Diagnosis not present

## 2022-10-02 DIAGNOSIS — N3281 Overactive bladder: Secondary | ICD-10-CM | POA: Diagnosis not present

## 2022-10-02 DIAGNOSIS — I10 Essential (primary) hypertension: Secondary | ICD-10-CM | POA: Diagnosis not present

## 2022-10-02 DIAGNOSIS — Z7982 Long term (current) use of aspirin: Secondary | ICD-10-CM | POA: Diagnosis not present

## 2022-10-02 DIAGNOSIS — D62 Acute posthemorrhagic anemia: Secondary | ICD-10-CM | POA: Diagnosis not present

## 2022-10-02 DIAGNOSIS — F0390 Unspecified dementia without behavioral disturbance: Secondary | ICD-10-CM | POA: Diagnosis not present

## 2022-10-02 DIAGNOSIS — M25552 Pain in left hip: Secondary | ICD-10-CM | POA: Diagnosis not present

## 2022-10-05 DIAGNOSIS — I480 Paroxysmal atrial fibrillation: Secondary | ICD-10-CM | POA: Diagnosis not present

## 2022-10-05 DIAGNOSIS — M19072 Primary osteoarthritis, left ankle and foot: Secondary | ICD-10-CM | POA: Diagnosis not present

## 2022-10-05 DIAGNOSIS — D649 Anemia, unspecified: Secondary | ICD-10-CM | POA: Diagnosis not present

## 2022-10-05 DIAGNOSIS — R059 Cough, unspecified: Secondary | ICD-10-CM | POA: Diagnosis not present

## 2022-10-05 DIAGNOSIS — D62 Acute posthemorrhagic anemia: Secondary | ICD-10-CM | POA: Diagnosis not present

## 2022-10-05 DIAGNOSIS — E119 Type 2 diabetes mellitus without complications: Secondary | ICD-10-CM | POA: Diagnosis not present

## 2022-10-05 DIAGNOSIS — G8918 Other acute postprocedural pain: Secondary | ICD-10-CM | POA: Diagnosis not present

## 2022-10-05 DIAGNOSIS — S72142D Displaced intertrochanteric fracture of left femur, subsequent encounter for closed fracture with routine healing: Secondary | ICD-10-CM | POA: Diagnosis not present

## 2022-10-05 DIAGNOSIS — N3281 Overactive bladder: Secondary | ICD-10-CM | POA: Diagnosis not present

## 2022-10-05 DIAGNOSIS — E785 Hyperlipidemia, unspecified: Secondary | ICD-10-CM | POA: Diagnosis not present

## 2022-10-05 DIAGNOSIS — Z7901 Long term (current) use of anticoagulants: Secondary | ICD-10-CM | POA: Diagnosis not present

## 2022-10-05 DIAGNOSIS — M25552 Pain in left hip: Secondary | ICD-10-CM | POA: Diagnosis not present

## 2022-10-05 DIAGNOSIS — Z4789 Encounter for other orthopedic aftercare: Secondary | ICD-10-CM | POA: Diagnosis not present

## 2022-10-05 DIAGNOSIS — I4510 Unspecified right bundle-branch block: Secondary | ICD-10-CM | POA: Diagnosis not present

## 2022-10-05 DIAGNOSIS — F0390 Unspecified dementia without behavioral disturbance: Secondary | ICD-10-CM | POA: Diagnosis not present

## 2022-10-05 DIAGNOSIS — S72002A Fracture of unspecified part of neck of left femur, initial encounter for closed fracture: Secondary | ICD-10-CM | POA: Diagnosis not present

## 2022-10-05 DIAGNOSIS — I482 Chronic atrial fibrillation, unspecified: Secondary | ICD-10-CM | POA: Diagnosis not present

## 2022-10-05 DIAGNOSIS — S79929A Unspecified injury of unspecified thigh, initial encounter: Secondary | ICD-10-CM | POA: Diagnosis not present

## 2022-10-05 DIAGNOSIS — I440 Atrioventricular block, first degree: Secondary | ICD-10-CM | POA: Diagnosis not present

## 2022-10-05 DIAGNOSIS — K8020 Calculus of gallbladder without cholecystitis without obstruction: Secondary | ICD-10-CM | POA: Diagnosis not present

## 2022-10-05 DIAGNOSIS — R2681 Unsteadiness on feet: Secondary | ICD-10-CM | POA: Diagnosis not present

## 2022-10-05 DIAGNOSIS — I4430 Unspecified atrioventricular block: Secondary | ICD-10-CM | POA: Diagnosis not present

## 2022-10-05 DIAGNOSIS — Z7982 Long term (current) use of aspirin: Secondary | ICD-10-CM | POA: Diagnosis not present

## 2022-10-05 DIAGNOSIS — R000 Tachycardia, unspecified: Secondary | ICD-10-CM | POA: Diagnosis not present

## 2022-10-05 DIAGNOSIS — M6281 Muscle weakness (generalized): Secondary | ICD-10-CM | POA: Diagnosis not present

## 2022-10-05 DIAGNOSIS — N390 Urinary tract infection, site not specified: Secondary | ICD-10-CM | POA: Diagnosis not present

## 2022-10-05 DIAGNOSIS — R5381 Other malaise: Secondary | ICD-10-CM | POA: Diagnosis not present

## 2022-10-05 DIAGNOSIS — R8290 Unspecified abnormal findings in urine: Secondary | ICD-10-CM | POA: Diagnosis not present

## 2022-10-05 DIAGNOSIS — K219 Gastro-esophageal reflux disease without esophagitis: Secondary | ICD-10-CM | POA: Diagnosis not present

## 2022-10-05 DIAGNOSIS — F32A Depression, unspecified: Secondary | ICD-10-CM | POA: Diagnosis not present

## 2022-10-05 DIAGNOSIS — E039 Hypothyroidism, unspecified: Secondary | ICD-10-CM | POA: Diagnosis not present

## 2022-10-05 DIAGNOSIS — R062 Wheezing: Secondary | ICD-10-CM | POA: Diagnosis not present

## 2022-10-05 DIAGNOSIS — R531 Weakness: Secondary | ICD-10-CM | POA: Diagnosis not present

## 2022-10-05 DIAGNOSIS — Z79899 Other long term (current) drug therapy: Secondary | ICD-10-CM | POA: Diagnosis not present

## 2022-10-05 DIAGNOSIS — M96840 Postprocedural hematoma of a musculoskeletal structure following a musculoskeletal system procedure: Secondary | ICD-10-CM | POA: Diagnosis not present

## 2022-10-05 DIAGNOSIS — E871 Hypo-osmolality and hyponatremia: Secondary | ICD-10-CM | POA: Diagnosis not present

## 2022-10-05 DIAGNOSIS — E0800 Diabetes mellitus due to underlying condition with hyperosmolarity without nonketotic hyperglycemic-hyperosmolar coma (NKHHC): Secondary | ICD-10-CM | POA: Diagnosis not present

## 2022-10-05 DIAGNOSIS — I10 Essential (primary) hypertension: Secondary | ICD-10-CM | POA: Diagnosis not present

## 2022-10-05 DIAGNOSIS — Z7401 Bed confinement status: Secondary | ICD-10-CM | POA: Diagnosis not present

## 2022-10-09 DIAGNOSIS — S72142D Displaced intertrochanteric fracture of left femur, subsequent encounter for closed fracture with routine healing: Secondary | ICD-10-CM | POA: Diagnosis not present

## 2022-10-09 DIAGNOSIS — E871 Hypo-osmolality and hyponatremia: Secondary | ICD-10-CM | POA: Diagnosis not present

## 2022-10-09 DIAGNOSIS — F0390 Unspecified dementia without behavioral disturbance: Secondary | ICD-10-CM | POA: Diagnosis not present

## 2022-10-09 DIAGNOSIS — R5381 Other malaise: Secondary | ICD-10-CM | POA: Diagnosis not present

## 2022-10-09 DIAGNOSIS — E785 Hyperlipidemia, unspecified: Secondary | ICD-10-CM | POA: Diagnosis not present

## 2022-10-09 DIAGNOSIS — E039 Hypothyroidism, unspecified: Secondary | ICD-10-CM | POA: Diagnosis not present

## 2022-10-09 DIAGNOSIS — D649 Anemia, unspecified: Secondary | ICD-10-CM | POA: Diagnosis not present

## 2022-10-09 DIAGNOSIS — N3281 Overactive bladder: Secondary | ICD-10-CM | POA: Diagnosis not present

## 2022-10-10 DIAGNOSIS — E039 Hypothyroidism, unspecified: Secondary | ICD-10-CM | POA: Diagnosis not present

## 2022-10-10 DIAGNOSIS — S72142D Displaced intertrochanteric fracture of left femur, subsequent encounter for closed fracture with routine healing: Secondary | ICD-10-CM | POA: Diagnosis not present

## 2022-10-10 DIAGNOSIS — E871 Hypo-osmolality and hyponatremia: Secondary | ICD-10-CM | POA: Diagnosis not present

## 2022-10-10 DIAGNOSIS — N3281 Overactive bladder: Secondary | ICD-10-CM | POA: Diagnosis not present

## 2022-10-10 DIAGNOSIS — K219 Gastro-esophageal reflux disease without esophagitis: Secondary | ICD-10-CM | POA: Diagnosis not present

## 2022-10-10 DIAGNOSIS — I10 Essential (primary) hypertension: Secondary | ICD-10-CM | POA: Diagnosis not present

## 2022-10-10 DIAGNOSIS — E785 Hyperlipidemia, unspecified: Secondary | ICD-10-CM | POA: Diagnosis not present

## 2022-10-10 DIAGNOSIS — F0390 Unspecified dementia without behavioral disturbance: Secondary | ICD-10-CM | POA: Diagnosis not present

## 2022-10-11 DIAGNOSIS — I10 Essential (primary) hypertension: Secondary | ICD-10-CM | POA: Diagnosis not present

## 2022-10-11 DIAGNOSIS — S72142D Displaced intertrochanteric fracture of left femur, subsequent encounter for closed fracture with routine healing: Secondary | ICD-10-CM | POA: Diagnosis not present

## 2022-10-11 DIAGNOSIS — I480 Paroxysmal atrial fibrillation: Secondary | ICD-10-CM | POA: Diagnosis not present

## 2022-10-11 DIAGNOSIS — D62 Acute posthemorrhagic anemia: Secondary | ICD-10-CM | POA: Diagnosis not present

## 2022-10-17 ENCOUNTER — Ambulatory Visit: Payer: Medicare HMO | Admitting: Physician Assistant

## 2022-10-18 DIAGNOSIS — F0390 Unspecified dementia without behavioral disturbance: Secondary | ICD-10-CM | POA: Diagnosis not present

## 2022-10-18 DIAGNOSIS — I480 Paroxysmal atrial fibrillation: Secondary | ICD-10-CM | POA: Diagnosis not present

## 2022-10-22 DIAGNOSIS — R059 Cough, unspecified: Secondary | ICD-10-CM | POA: Diagnosis not present

## 2022-10-22 DIAGNOSIS — I10 Essential (primary) hypertension: Secondary | ICD-10-CM | POA: Diagnosis not present

## 2022-10-22 DIAGNOSIS — R8290 Unspecified abnormal findings in urine: Secondary | ICD-10-CM | POA: Diagnosis not present

## 2022-10-22 DIAGNOSIS — R062 Wheezing: Secondary | ICD-10-CM | POA: Diagnosis not present

## 2022-10-22 DIAGNOSIS — F0390 Unspecified dementia without behavioral disturbance: Secondary | ICD-10-CM | POA: Diagnosis not present

## 2022-10-23 DIAGNOSIS — R059 Cough, unspecified: Secondary | ICD-10-CM | POA: Diagnosis not present

## 2022-10-23 DIAGNOSIS — F0390 Unspecified dementia without behavioral disturbance: Secondary | ICD-10-CM | POA: Diagnosis not present

## 2022-10-23 DIAGNOSIS — N390 Urinary tract infection, site not specified: Secondary | ICD-10-CM | POA: Diagnosis not present

## 2022-10-23 DIAGNOSIS — I10 Essential (primary) hypertension: Secondary | ICD-10-CM | POA: Diagnosis not present

## 2022-10-24 DIAGNOSIS — F0390 Unspecified dementia without behavioral disturbance: Secondary | ICD-10-CM | POA: Diagnosis not present

## 2022-10-24 DIAGNOSIS — Z7901 Long term (current) use of anticoagulants: Secondary | ICD-10-CM | POA: Diagnosis not present

## 2022-10-24 DIAGNOSIS — E119 Type 2 diabetes mellitus without complications: Secondary | ICD-10-CM | POA: Diagnosis not present

## 2022-10-24 DIAGNOSIS — I482 Chronic atrial fibrillation, unspecified: Secondary | ICD-10-CM | POA: Diagnosis not present

## 2022-10-24 DIAGNOSIS — M96840 Postprocedural hematoma of a musculoskeletal structure following a musculoskeletal system procedure: Secondary | ICD-10-CM | POA: Diagnosis not present

## 2022-10-24 DIAGNOSIS — I4430 Unspecified atrioventricular block: Secondary | ICD-10-CM | POA: Diagnosis not present

## 2022-10-24 DIAGNOSIS — Z79899 Other long term (current) drug therapy: Secondary | ICD-10-CM | POA: Diagnosis not present

## 2022-10-24 DIAGNOSIS — R000 Tachycardia, unspecified: Secondary | ICD-10-CM | POA: Diagnosis not present

## 2022-10-24 DIAGNOSIS — R5381 Other malaise: Secondary | ICD-10-CM | POA: Diagnosis not present

## 2022-10-24 DIAGNOSIS — I480 Paroxysmal atrial fibrillation: Secondary | ICD-10-CM | POA: Diagnosis not present

## 2022-10-24 DIAGNOSIS — Z7982 Long term (current) use of aspirin: Secondary | ICD-10-CM | POA: Diagnosis not present

## 2022-10-24 DIAGNOSIS — D62 Acute posthemorrhagic anemia: Secondary | ICD-10-CM | POA: Diagnosis not present

## 2022-10-24 DIAGNOSIS — K8020 Calculus of gallbladder without cholecystitis without obstruction: Secondary | ICD-10-CM | POA: Diagnosis not present

## 2022-10-24 DIAGNOSIS — S72142D Displaced intertrochanteric fracture of left femur, subsequent encounter for closed fracture with routine healing: Secondary | ICD-10-CM | POA: Diagnosis not present

## 2022-10-24 DIAGNOSIS — I10 Essential (primary) hypertension: Secondary | ICD-10-CM | POA: Diagnosis not present

## 2022-10-24 DIAGNOSIS — M19072 Primary osteoarthritis, left ankle and foot: Secondary | ICD-10-CM | POA: Diagnosis not present

## 2022-10-24 DIAGNOSIS — M25552 Pain in left hip: Secondary | ICD-10-CM | POA: Diagnosis not present

## 2022-10-24 DIAGNOSIS — D649 Anemia, unspecified: Secondary | ICD-10-CM | POA: Diagnosis not present

## 2022-10-24 DIAGNOSIS — E871 Hypo-osmolality and hyponatremia: Secondary | ICD-10-CM | POA: Diagnosis not present

## 2022-10-24 DIAGNOSIS — S79929A Unspecified injury of unspecified thigh, initial encounter: Secondary | ICD-10-CM | POA: Diagnosis not present

## 2022-10-24 DIAGNOSIS — I440 Atrioventricular block, first degree: Secondary | ICD-10-CM | POA: Diagnosis not present

## 2022-10-24 DIAGNOSIS — S72002A Fracture of unspecified part of neck of left femur, initial encounter for closed fracture: Secondary | ICD-10-CM | POA: Diagnosis not present

## 2022-10-24 DIAGNOSIS — I4510 Unspecified right bundle-branch block: Secondary | ICD-10-CM | POA: Diagnosis not present

## 2022-10-25 DIAGNOSIS — R634 Abnormal weight loss: Secondary | ICD-10-CM | POA: Diagnosis not present

## 2022-10-25 DIAGNOSIS — F0390 Unspecified dementia without behavioral disturbance: Secondary | ICD-10-CM | POA: Diagnosis not present

## 2022-10-25 DIAGNOSIS — R2242 Localized swelling, mass and lump, left lower limb: Secondary | ICD-10-CM | POA: Diagnosis not present

## 2022-10-25 DIAGNOSIS — M25552 Pain in left hip: Secondary | ICD-10-CM | POA: Diagnosis not present

## 2022-10-25 DIAGNOSIS — Z741 Need for assistance with personal care: Secondary | ICD-10-CM | POA: Diagnosis not present

## 2022-10-25 DIAGNOSIS — E871 Hypo-osmolality and hyponatremia: Secondary | ICD-10-CM | POA: Diagnosis not present

## 2022-10-25 DIAGNOSIS — E785 Hyperlipidemia, unspecified: Secondary | ICD-10-CM | POA: Diagnosis not present

## 2022-10-25 DIAGNOSIS — R2689 Other abnormalities of gait and mobility: Secondary | ICD-10-CM | POA: Diagnosis not present

## 2022-10-25 DIAGNOSIS — D649 Anemia, unspecified: Secondary | ICD-10-CM | POA: Diagnosis not present

## 2022-10-25 DIAGNOSIS — R2681 Unsteadiness on feet: Secondary | ICD-10-CM | POA: Diagnosis not present

## 2022-10-25 DIAGNOSIS — S72142D Displaced intertrochanteric fracture of left femur, subsequent encounter for closed fracture with routine healing: Secondary | ICD-10-CM | POA: Diagnosis not present

## 2022-10-25 DIAGNOSIS — R600 Localized edema: Secondary | ICD-10-CM | POA: Diagnosis not present

## 2022-10-25 DIAGNOSIS — N3281 Overactive bladder: Secondary | ICD-10-CM | POA: Diagnosis not present

## 2022-10-25 DIAGNOSIS — I10 Essential (primary) hypertension: Secondary | ICD-10-CM | POA: Diagnosis not present

## 2022-10-25 DIAGNOSIS — K219 Gastro-esophageal reflux disease without esophagitis: Secondary | ICD-10-CM | POA: Diagnosis not present

## 2022-10-25 DIAGNOSIS — R278 Other lack of coordination: Secondary | ICD-10-CM | POA: Diagnosis not present

## 2022-10-25 DIAGNOSIS — M6281 Muscle weakness (generalized): Secondary | ICD-10-CM | POA: Diagnosis not present

## 2022-10-25 DIAGNOSIS — Z7901 Long term (current) use of anticoagulants: Secondary | ICD-10-CM | POA: Diagnosis not present

## 2022-10-25 DIAGNOSIS — I495 Sick sinus syndrome: Secondary | ICD-10-CM | POA: Diagnosis not present

## 2022-10-25 DIAGNOSIS — D62 Acute posthemorrhagic anemia: Secondary | ICD-10-CM | POA: Diagnosis not present

## 2022-10-25 DIAGNOSIS — S72143A Displaced intertrochanteric fracture of unspecified femur, initial encounter for closed fracture: Secondary | ICD-10-CM | POA: Diagnosis not present

## 2022-10-25 DIAGNOSIS — Z4789 Encounter for other orthopedic aftercare: Secondary | ICD-10-CM | POA: Diagnosis not present

## 2022-10-25 DIAGNOSIS — Z7401 Bed confinement status: Secondary | ICD-10-CM | POA: Diagnosis not present

## 2022-10-25 DIAGNOSIS — I482 Chronic atrial fibrillation, unspecified: Secondary | ICD-10-CM | POA: Diagnosis not present

## 2022-10-25 DIAGNOSIS — Z7982 Long term (current) use of aspirin: Secondary | ICD-10-CM | POA: Diagnosis not present

## 2022-10-25 DIAGNOSIS — R531 Weakness: Secondary | ICD-10-CM | POA: Diagnosis not present

## 2022-10-25 DIAGNOSIS — N189 Chronic kidney disease, unspecified: Secondary | ICD-10-CM | POA: Diagnosis not present

## 2022-10-25 DIAGNOSIS — E039 Hypothyroidism, unspecified: Secondary | ICD-10-CM | POA: Diagnosis not present

## 2022-10-25 DIAGNOSIS — M7989 Other specified soft tissue disorders: Secondary | ICD-10-CM | POA: Diagnosis not present

## 2022-10-25 DIAGNOSIS — Z79899 Other long term (current) drug therapy: Secondary | ICD-10-CM | POA: Diagnosis not present

## 2022-10-25 DIAGNOSIS — I480 Paroxysmal atrial fibrillation: Secondary | ICD-10-CM | POA: Diagnosis not present

## 2022-10-29 DIAGNOSIS — E785 Hyperlipidemia, unspecified: Secondary | ICD-10-CM | POA: Diagnosis not present

## 2022-10-29 DIAGNOSIS — E871 Hypo-osmolality and hyponatremia: Secondary | ICD-10-CM | POA: Diagnosis not present

## 2022-10-29 DIAGNOSIS — D649 Anemia, unspecified: Secondary | ICD-10-CM | POA: Diagnosis not present

## 2022-10-29 DIAGNOSIS — F0390 Unspecified dementia without behavioral disturbance: Secondary | ICD-10-CM | POA: Diagnosis not present

## 2022-10-29 DIAGNOSIS — I10 Essential (primary) hypertension: Secondary | ICD-10-CM | POA: Diagnosis not present

## 2022-10-29 DIAGNOSIS — I480 Paroxysmal atrial fibrillation: Secondary | ICD-10-CM | POA: Diagnosis not present

## 2022-10-29 DIAGNOSIS — M25552 Pain in left hip: Secondary | ICD-10-CM | POA: Diagnosis not present

## 2022-10-29 DIAGNOSIS — N3281 Overactive bladder: Secondary | ICD-10-CM | POA: Diagnosis not present

## 2022-10-31 DIAGNOSIS — M25552 Pain in left hip: Secondary | ICD-10-CM | POA: Diagnosis not present

## 2022-10-31 DIAGNOSIS — S72142D Displaced intertrochanteric fracture of left femur, subsequent encounter for closed fracture with routine healing: Secondary | ICD-10-CM | POA: Diagnosis not present

## 2022-11-01 DIAGNOSIS — S72142D Displaced intertrochanteric fracture of left femur, subsequent encounter for closed fracture with routine healing: Secondary | ICD-10-CM | POA: Diagnosis not present

## 2022-11-01 DIAGNOSIS — R600 Localized edema: Secondary | ICD-10-CM | POA: Diagnosis not present

## 2022-11-06 DIAGNOSIS — I10 Essential (primary) hypertension: Secondary | ICD-10-CM | POA: Diagnosis not present

## 2022-11-06 DIAGNOSIS — F0390 Unspecified dementia without behavioral disturbance: Secondary | ICD-10-CM | POA: Diagnosis not present

## 2022-11-07 DIAGNOSIS — I480 Paroxysmal atrial fibrillation: Secondary | ICD-10-CM | POA: Diagnosis not present

## 2022-11-07 DIAGNOSIS — S72142D Displaced intertrochanteric fracture of left femur, subsequent encounter for closed fracture with routine healing: Secondary | ICD-10-CM | POA: Diagnosis not present

## 2022-11-07 DIAGNOSIS — R600 Localized edema: Secondary | ICD-10-CM | POA: Diagnosis not present

## 2022-11-13 DIAGNOSIS — N189 Chronic kidney disease, unspecified: Secondary | ICD-10-CM | POA: Diagnosis not present

## 2022-11-13 DIAGNOSIS — F0390 Unspecified dementia without behavioral disturbance: Secondary | ICD-10-CM | POA: Diagnosis not present

## 2022-11-16 DIAGNOSIS — S72142D Displaced intertrochanteric fracture of left femur, subsequent encounter for closed fracture with routine healing: Secondary | ICD-10-CM | POA: Diagnosis not present

## 2022-11-19 DIAGNOSIS — I10 Essential (primary) hypertension: Secondary | ICD-10-CM | POA: Diagnosis not present

## 2022-11-19 DIAGNOSIS — R634 Abnormal weight loss: Secondary | ICD-10-CM | POA: Diagnosis not present

## 2022-11-19 DIAGNOSIS — N3281 Overactive bladder: Secondary | ICD-10-CM | POA: Diagnosis not present

## 2022-11-21 ENCOUNTER — Ambulatory Visit (INDEPENDENT_AMBULATORY_CARE_PROVIDER_SITE_OTHER): Payer: Medicare HMO

## 2022-11-21 DIAGNOSIS — I495 Sick sinus syndrome: Secondary | ICD-10-CM

## 2022-11-21 DIAGNOSIS — S72142D Displaced intertrochanteric fracture of left femur, subsequent encounter for closed fracture with routine healing: Secondary | ICD-10-CM | POA: Diagnosis not present

## 2022-11-21 DIAGNOSIS — I480 Paroxysmal atrial fibrillation: Secondary | ICD-10-CM | POA: Diagnosis not present

## 2022-11-22 LAB — CUP PACEART REMOTE DEVICE CHECK
Battery Remaining Longevity: 113 mo
Battery Voltage: 3.02 V
Brady Statistic AP VP Percent: 0.39 %
Brady Statistic AP VS Percent: 63.15 %
Brady Statistic AS VP Percent: 0.02 %
Brady Statistic AS VS Percent: 36.43 %
Brady Statistic RA Percent Paced: 64.14 %
Brady Statistic RV Percent Paced: 0.42 %
Date Time Interrogation Session: 20240710152018
Implantable Lead Connection Status: 753985
Implantable Lead Connection Status: 753985
Implantable Lead Implant Date: 20201124
Implantable Lead Implant Date: 20201124
Implantable Lead Location: 753859
Implantable Lead Location: 753860
Implantable Lead Model: 5076
Implantable Lead Model: 5076
Implantable Pulse Generator Implant Date: 20201124
Lead Channel Impedance Value: 342 Ohm
Lead Channel Impedance Value: 380 Ohm
Lead Channel Impedance Value: 399 Ohm
Lead Channel Impedance Value: 513 Ohm
Lead Channel Pacing Threshold Amplitude: 0.5 V
Lead Channel Pacing Threshold Amplitude: 0.75 V
Lead Channel Pacing Threshold Pulse Width: 0.4 ms
Lead Channel Pacing Threshold Pulse Width: 0.4 ms
Lead Channel Sensing Intrinsic Amplitude: 2.125 mV
Lead Channel Sensing Intrinsic Amplitude: 2.125 mV
Lead Channel Sensing Intrinsic Amplitude: 4.25 mV
Lead Channel Sensing Intrinsic Amplitude: 4.25 mV
Lead Channel Setting Pacing Amplitude: 1.5 V
Lead Channel Setting Pacing Amplitude: 2 V
Lead Channel Setting Pacing Pulse Width: 0.4 ms
Lead Channel Setting Sensing Sensitivity: 1.2 mV
Zone Setting Status: 755011
Zone Setting Status: 755011

## 2022-11-23 DIAGNOSIS — M25552 Pain in left hip: Secondary | ICD-10-CM | POA: Diagnosis not present

## 2022-11-23 DIAGNOSIS — S72143A Displaced intertrochanteric fracture of unspecified femur, initial encounter for closed fracture: Secondary | ICD-10-CM | POA: Diagnosis not present

## 2022-11-26 DIAGNOSIS — E039 Hypothyroidism, unspecified: Secondary | ICD-10-CM | POA: Diagnosis not present

## 2022-11-26 DIAGNOSIS — K219 Gastro-esophageal reflux disease without esophagitis: Secondary | ICD-10-CM | POA: Diagnosis not present

## 2022-11-26 DIAGNOSIS — F0390 Unspecified dementia without behavioral disturbance: Secondary | ICD-10-CM | POA: Diagnosis not present

## 2022-12-03 DIAGNOSIS — M6281 Muscle weakness (generalized): Secondary | ICD-10-CM | POA: Diagnosis not present

## 2022-12-03 DIAGNOSIS — R5381 Other malaise: Secondary | ICD-10-CM | POA: Diagnosis not present

## 2022-12-03 DIAGNOSIS — R2681 Unsteadiness on feet: Secondary | ICD-10-CM | POA: Diagnosis not present

## 2022-12-03 DIAGNOSIS — R2689 Other abnormalities of gait and mobility: Secondary | ICD-10-CM | POA: Diagnosis not present

## 2022-12-03 DIAGNOSIS — Z741 Need for assistance with personal care: Secondary | ICD-10-CM | POA: Diagnosis not present

## 2022-12-03 DIAGNOSIS — R278 Other lack of coordination: Secondary | ICD-10-CM | POA: Diagnosis not present

## 2022-12-03 DIAGNOSIS — W19XXXA Unspecified fall, initial encounter: Secondary | ICD-10-CM | POA: Diagnosis not present

## 2022-12-03 DIAGNOSIS — Z4789 Encounter for other orthopedic aftercare: Secondary | ICD-10-CM | POA: Diagnosis not present

## 2022-12-03 DIAGNOSIS — R531 Weakness: Secondary | ICD-10-CM | POA: Diagnosis not present

## 2022-12-04 DIAGNOSIS — R2681 Unsteadiness on feet: Secondary | ICD-10-CM | POA: Diagnosis not present

## 2022-12-04 DIAGNOSIS — R278 Other lack of coordination: Secondary | ICD-10-CM | POA: Diagnosis not present

## 2022-12-04 DIAGNOSIS — R2689 Other abnormalities of gait and mobility: Secondary | ICD-10-CM | POA: Diagnosis not present

## 2022-12-04 DIAGNOSIS — M6281 Muscle weakness (generalized): Secondary | ICD-10-CM | POA: Diagnosis not present

## 2022-12-04 DIAGNOSIS — R531 Weakness: Secondary | ICD-10-CM | POA: Diagnosis not present

## 2022-12-04 DIAGNOSIS — Z741 Need for assistance with personal care: Secondary | ICD-10-CM | POA: Diagnosis not present

## 2022-12-04 DIAGNOSIS — Z4789 Encounter for other orthopedic aftercare: Secondary | ICD-10-CM | POA: Diagnosis not present

## 2022-12-05 DIAGNOSIS — Z719 Counseling, unspecified: Secondary | ICD-10-CM | POA: Diagnosis not present

## 2022-12-05 DIAGNOSIS — F0390 Unspecified dementia without behavioral disturbance: Secondary | ICD-10-CM | POA: Diagnosis not present

## 2022-12-10 DIAGNOSIS — W19XXXA Unspecified fall, initial encounter: Secondary | ICD-10-CM | POA: Diagnosis not present

## 2022-12-10 DIAGNOSIS — R2689 Other abnormalities of gait and mobility: Secondary | ICD-10-CM | POA: Diagnosis not present

## 2022-12-10 DIAGNOSIS — R278 Other lack of coordination: Secondary | ICD-10-CM | POA: Diagnosis not present

## 2022-12-10 DIAGNOSIS — M6281 Muscle weakness (generalized): Secondary | ICD-10-CM | POA: Diagnosis not present

## 2022-12-10 DIAGNOSIS — Z4789 Encounter for other orthopedic aftercare: Secondary | ICD-10-CM | POA: Diagnosis not present

## 2022-12-10 DIAGNOSIS — Z741 Need for assistance with personal care: Secondary | ICD-10-CM | POA: Diagnosis not present

## 2022-12-10 DIAGNOSIS — R531 Weakness: Secondary | ICD-10-CM | POA: Diagnosis not present

## 2022-12-10 DIAGNOSIS — F0390 Unspecified dementia without behavioral disturbance: Secondary | ICD-10-CM | POA: Diagnosis not present

## 2022-12-10 DIAGNOSIS — R2681 Unsteadiness on feet: Secondary | ICD-10-CM | POA: Diagnosis not present

## 2022-12-10 DIAGNOSIS — R5381 Other malaise: Secondary | ICD-10-CM | POA: Diagnosis not present

## 2022-12-11 DIAGNOSIS — R531 Weakness: Secondary | ICD-10-CM | POA: Diagnosis not present

## 2022-12-11 DIAGNOSIS — R2681 Unsteadiness on feet: Secondary | ICD-10-CM | POA: Diagnosis not present

## 2022-12-11 DIAGNOSIS — Z741 Need for assistance with personal care: Secondary | ICD-10-CM | POA: Diagnosis not present

## 2022-12-11 DIAGNOSIS — R2689 Other abnormalities of gait and mobility: Secondary | ICD-10-CM | POA: Diagnosis not present

## 2022-12-11 DIAGNOSIS — Z4789 Encounter for other orthopedic aftercare: Secondary | ICD-10-CM | POA: Diagnosis not present

## 2022-12-11 DIAGNOSIS — R278 Other lack of coordination: Secondary | ICD-10-CM | POA: Diagnosis not present

## 2022-12-11 DIAGNOSIS — M6281 Muscle weakness (generalized): Secondary | ICD-10-CM | POA: Diagnosis not present

## 2022-12-12 NOTE — Progress Notes (Signed)
Remote pacemaker transmission.   

## 2022-12-13 DIAGNOSIS — Z4789 Encounter for other orthopedic aftercare: Secondary | ICD-10-CM | POA: Diagnosis not present

## 2022-12-13 DIAGNOSIS — R531 Weakness: Secondary | ICD-10-CM | POA: Diagnosis not present

## 2022-12-13 DIAGNOSIS — R2689 Other abnormalities of gait and mobility: Secondary | ICD-10-CM | POA: Diagnosis not present

## 2022-12-13 DIAGNOSIS — R278 Other lack of coordination: Secondary | ICD-10-CM | POA: Diagnosis not present

## 2022-12-13 DIAGNOSIS — R2681 Unsteadiness on feet: Secondary | ICD-10-CM | POA: Diagnosis not present

## 2022-12-13 DIAGNOSIS — Z741 Need for assistance with personal care: Secondary | ICD-10-CM | POA: Diagnosis not present

## 2022-12-13 DIAGNOSIS — M6281 Muscle weakness (generalized): Secondary | ICD-10-CM | POA: Diagnosis not present

## 2022-12-17 DIAGNOSIS — R531 Weakness: Secondary | ICD-10-CM | POA: Diagnosis not present

## 2022-12-17 DIAGNOSIS — Z4789 Encounter for other orthopedic aftercare: Secondary | ICD-10-CM | POA: Diagnosis not present

## 2022-12-17 DIAGNOSIS — Z741 Need for assistance with personal care: Secondary | ICD-10-CM | POA: Diagnosis not present

## 2022-12-17 DIAGNOSIS — M6281 Muscle weakness (generalized): Secondary | ICD-10-CM | POA: Diagnosis not present

## 2022-12-17 DIAGNOSIS — R278 Other lack of coordination: Secondary | ICD-10-CM | POA: Diagnosis not present

## 2022-12-17 DIAGNOSIS — R2689 Other abnormalities of gait and mobility: Secondary | ICD-10-CM | POA: Diagnosis not present

## 2022-12-17 DIAGNOSIS — R2681 Unsteadiness on feet: Secondary | ICD-10-CM | POA: Diagnosis not present

## 2022-12-20 DIAGNOSIS — M6281 Muscle weakness (generalized): Secondary | ICD-10-CM | POA: Diagnosis not present

## 2022-12-20 DIAGNOSIS — R2681 Unsteadiness on feet: Secondary | ICD-10-CM | POA: Diagnosis not present

## 2022-12-20 DIAGNOSIS — R2689 Other abnormalities of gait and mobility: Secondary | ICD-10-CM | POA: Diagnosis not present

## 2022-12-20 DIAGNOSIS — Z4789 Encounter for other orthopedic aftercare: Secondary | ICD-10-CM | POA: Diagnosis not present

## 2022-12-20 DIAGNOSIS — R278 Other lack of coordination: Secondary | ICD-10-CM | POA: Diagnosis not present

## 2022-12-20 DIAGNOSIS — R531 Weakness: Secondary | ICD-10-CM | POA: Diagnosis not present

## 2022-12-20 DIAGNOSIS — Z741 Need for assistance with personal care: Secondary | ICD-10-CM | POA: Diagnosis not present

## 2022-12-21 DIAGNOSIS — R2689 Other abnormalities of gait and mobility: Secondary | ICD-10-CM | POA: Diagnosis not present

## 2022-12-21 DIAGNOSIS — Z741 Need for assistance with personal care: Secondary | ICD-10-CM | POA: Diagnosis not present

## 2022-12-21 DIAGNOSIS — M6281 Muscle weakness (generalized): Secondary | ICD-10-CM | POA: Diagnosis not present

## 2022-12-21 DIAGNOSIS — R2681 Unsteadiness on feet: Secondary | ICD-10-CM | POA: Diagnosis not present

## 2022-12-21 DIAGNOSIS — R278 Other lack of coordination: Secondary | ICD-10-CM | POA: Diagnosis not present

## 2022-12-21 DIAGNOSIS — Z4789 Encounter for other orthopedic aftercare: Secondary | ICD-10-CM | POA: Diagnosis not present

## 2022-12-21 DIAGNOSIS — R531 Weakness: Secondary | ICD-10-CM | POA: Diagnosis not present

## 2022-12-25 DIAGNOSIS — Z741 Need for assistance with personal care: Secondary | ICD-10-CM | POA: Diagnosis not present

## 2022-12-25 DIAGNOSIS — M6281 Muscle weakness (generalized): Secondary | ICD-10-CM | POA: Diagnosis not present

## 2022-12-25 DIAGNOSIS — R531 Weakness: Secondary | ICD-10-CM | POA: Diagnosis not present

## 2022-12-25 DIAGNOSIS — Z4789 Encounter for other orthopedic aftercare: Secondary | ICD-10-CM | POA: Diagnosis not present

## 2022-12-25 DIAGNOSIS — R2681 Unsteadiness on feet: Secondary | ICD-10-CM | POA: Diagnosis not present

## 2022-12-25 DIAGNOSIS — R2689 Other abnormalities of gait and mobility: Secondary | ICD-10-CM | POA: Diagnosis not present

## 2022-12-25 DIAGNOSIS — R278 Other lack of coordination: Secondary | ICD-10-CM | POA: Diagnosis not present

## 2022-12-27 DIAGNOSIS — R2689 Other abnormalities of gait and mobility: Secondary | ICD-10-CM | POA: Diagnosis not present

## 2022-12-27 DIAGNOSIS — R531 Weakness: Secondary | ICD-10-CM | POA: Diagnosis not present

## 2022-12-27 DIAGNOSIS — Z4789 Encounter for other orthopedic aftercare: Secondary | ICD-10-CM | POA: Diagnosis not present

## 2022-12-27 DIAGNOSIS — Z741 Need for assistance with personal care: Secondary | ICD-10-CM | POA: Diagnosis not present

## 2022-12-27 DIAGNOSIS — M6281 Muscle weakness (generalized): Secondary | ICD-10-CM | POA: Diagnosis not present

## 2022-12-27 DIAGNOSIS — R2681 Unsteadiness on feet: Secondary | ICD-10-CM | POA: Diagnosis not present

## 2022-12-27 DIAGNOSIS — R278 Other lack of coordination: Secondary | ICD-10-CM | POA: Diagnosis not present

## 2022-12-28 DIAGNOSIS — Z4789 Encounter for other orthopedic aftercare: Secondary | ICD-10-CM | POA: Diagnosis not present

## 2022-12-28 DIAGNOSIS — M6281 Muscle weakness (generalized): Secondary | ICD-10-CM | POA: Diagnosis not present

## 2022-12-28 DIAGNOSIS — Z741 Need for assistance with personal care: Secondary | ICD-10-CM | POA: Diagnosis not present

## 2022-12-28 DIAGNOSIS — R531 Weakness: Secondary | ICD-10-CM | POA: Diagnosis not present

## 2022-12-28 DIAGNOSIS — R2681 Unsteadiness on feet: Secondary | ICD-10-CM | POA: Diagnosis not present

## 2022-12-28 DIAGNOSIS — R2689 Other abnormalities of gait and mobility: Secondary | ICD-10-CM | POA: Diagnosis not present

## 2022-12-28 DIAGNOSIS — R278 Other lack of coordination: Secondary | ICD-10-CM | POA: Diagnosis not present

## 2022-12-31 DIAGNOSIS — R531 Weakness: Secondary | ICD-10-CM | POA: Diagnosis not present

## 2022-12-31 DIAGNOSIS — R2681 Unsteadiness on feet: Secondary | ICD-10-CM | POA: Diagnosis not present

## 2022-12-31 DIAGNOSIS — R278 Other lack of coordination: Secondary | ICD-10-CM | POA: Diagnosis not present

## 2022-12-31 DIAGNOSIS — R2689 Other abnormalities of gait and mobility: Secondary | ICD-10-CM | POA: Diagnosis not present

## 2022-12-31 DIAGNOSIS — Z4789 Encounter for other orthopedic aftercare: Secondary | ICD-10-CM | POA: Diagnosis not present

## 2022-12-31 DIAGNOSIS — Z741 Need for assistance with personal care: Secondary | ICD-10-CM | POA: Diagnosis not present

## 2022-12-31 DIAGNOSIS — M6281 Muscle weakness (generalized): Secondary | ICD-10-CM | POA: Diagnosis not present

## 2023-01-01 DIAGNOSIS — Z4789 Encounter for other orthopedic aftercare: Secondary | ICD-10-CM | POA: Diagnosis not present

## 2023-01-01 DIAGNOSIS — F0390 Unspecified dementia without behavioral disturbance: Secondary | ICD-10-CM | POA: Diagnosis not present

## 2023-01-01 DIAGNOSIS — I10 Essential (primary) hypertension: Secondary | ICD-10-CM | POA: Diagnosis not present

## 2023-01-01 DIAGNOSIS — R2689 Other abnormalities of gait and mobility: Secondary | ICD-10-CM | POA: Diagnosis not present

## 2023-01-01 DIAGNOSIS — I480 Paroxysmal atrial fibrillation: Secondary | ICD-10-CM | POA: Diagnosis not present

## 2023-01-01 DIAGNOSIS — R531 Weakness: Secondary | ICD-10-CM | POA: Diagnosis not present

## 2023-01-01 DIAGNOSIS — M6281 Muscle weakness (generalized): Secondary | ICD-10-CM | POA: Diagnosis not present

## 2023-01-01 DIAGNOSIS — Z741 Need for assistance with personal care: Secondary | ICD-10-CM | POA: Diagnosis not present

## 2023-01-01 DIAGNOSIS — R2681 Unsteadiness on feet: Secondary | ICD-10-CM | POA: Diagnosis not present

## 2023-01-01 DIAGNOSIS — R278 Other lack of coordination: Secondary | ICD-10-CM | POA: Diagnosis not present

## 2023-01-03 ENCOUNTER — Ambulatory Visit: Payer: Medicare HMO | Attending: Cardiology | Admitting: Cardiology

## 2023-01-03 ENCOUNTER — Encounter: Payer: Self-pay | Admitting: Cardiology

## 2023-01-03 VITALS — BP 120/58 | HR 85 | Ht 70.0 in | Wt 164.8 lb

## 2023-01-03 DIAGNOSIS — I10 Essential (primary) hypertension: Secondary | ICD-10-CM | POA: Diagnosis not present

## 2023-01-03 DIAGNOSIS — Z4789 Encounter for other orthopedic aftercare: Secondary | ICD-10-CM | POA: Diagnosis not present

## 2023-01-03 DIAGNOSIS — R2681 Unsteadiness on feet: Secondary | ICD-10-CM | POA: Diagnosis not present

## 2023-01-03 DIAGNOSIS — R278 Other lack of coordination: Secondary | ICD-10-CM | POA: Diagnosis not present

## 2023-01-03 DIAGNOSIS — I495 Sick sinus syndrome: Secondary | ICD-10-CM

## 2023-01-03 DIAGNOSIS — M6281 Muscle weakness (generalized): Secondary | ICD-10-CM | POA: Diagnosis not present

## 2023-01-03 DIAGNOSIS — I480 Paroxysmal atrial fibrillation: Secondary | ICD-10-CM | POA: Diagnosis not present

## 2023-01-03 DIAGNOSIS — E782 Mixed hyperlipidemia: Secondary | ICD-10-CM

## 2023-01-03 DIAGNOSIS — Z741 Need for assistance with personal care: Secondary | ICD-10-CM | POA: Diagnosis not present

## 2023-01-03 DIAGNOSIS — R531 Weakness: Secondary | ICD-10-CM | POA: Diagnosis not present

## 2023-01-03 DIAGNOSIS — R2689 Other abnormalities of gait and mobility: Secondary | ICD-10-CM | POA: Diagnosis not present

## 2023-01-03 NOTE — Patient Instructions (Signed)

## 2023-01-03 NOTE — Progress Notes (Signed)
Cardiology Office Note:    Date:  01/03/2023   ID:  Sabrina Hunt, DOB 1932-07-11, MRN [id number]  PCP:  Street, Sabrina Coup, MD  Cardiologist:  Sabrina Brothers, MD   Referring MD: [address], Sabrina Hunt, *    ASSESSMENT:    1. Mixed hyperlipidemia   2. Benign essential hypertension   3. Paroxysmal atrial fibrillation (HCC)   4. Tachycardia-bradycardia syndrome (HCC)    PLAN:    In order of problems listed above:  Primary prevention stressed with the patient.  Importance of compliance with diet medication stressed and patient verbalized standing. Essential hypertension: Blood pressure stable and diet was emphasized. Paroxysmal atrial fibrillation: Pacemaker notes were reviewed.  No recent evidence of atrial fibrillation.  Patient is off anticoagulation and amiodarone for unclear reasons possibly because of the fact that she is very unstable on the gait. Mixed dyslipidemia: On lowering medications followed by primary care. Dementia: Significant and followed by primary care. Patient will be seen in follow-up appointment in 9 months or earlier if the patient has any concerns.    Medication Adjustments/Labs and Tests Ordered: Current medicines are reviewed at length with the patient today.  Concerns regarding medicines are outlined above.  Orders Placed This Encounter  Procedures   EKG 12-Lead   No orders of the defined types were placed in this encounter.    No chief complaint on file.    History of Present Illness:    Sabrina Hunt is a 89 y.o. female.  Patient has past medical history of essential hypertension, permanent pacemaker for tachybradycardia syndrome, paroxysmal atrial fibrillation diabetes mellitus and unsteady gait.  She denies any problems at this time.  She is brought in in a wheelchair.  No chest pain orthopnea or PND.  At the time of my evaluation, the patient is alert awake oriented and in no distress.  Past Medical History:  Diagnosis Date    Acquired thrombophilia (HCC)    Anemia    Arthritis    Atrial flutter (HCC) 03/20/2021   Benign essential hypertension    Bilateral hand pain 01/17/2016   Cardiac pacemaker in situ    Chronic kidney disease, stage 3a (HCC)    Diabetes mellitus 08/25/2011   Diabetic peripheral neuropathy (HCC) 08/25/2011   Dyslipidemia    Fall 04/08/2019   History of cardiomyopathy 08/23/2021   HLD (hyperlipidemia) 08/25/2011   HTN (hypertension) 08/25/2011   Hypothyroidism    Left ankle injury    Swelling   Left foot pain 02/21/2015   Long term current use of anticoagulant 03/20/2021   Lumbar spondylosis    Multilevel degenerative disc disease    Neuromuscular disorder (HCC)    Normocytic anemia 08/25/2011   Osteoarthritis of both knees 08/25/2011   S/p bilateral replacement, last 08/20/11   Overactive bladder    Paroxysmal atrial fibrillation (HCC) 04/13/2021   Paroxysmal cardiac arrhythmia    RBBB (right bundle branch block)    Reflux gastritis    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   Secondary neuropathy    Senile osteopenia    Shortness of breath 08/25/2011   Shoulder joint pain 08/14/2012   Subdural hematoma (HCC) 04/08/2019   Tachycardia-bradycardia syndrome (HCC) 04/13/2021   Type 2 diabetes mellitus with hyperlipidemia [facility])     Past Surgical History:  Procedure Laterality Date   ABDOMINAL HYSTERECTOMY     APPENDECTOMY     CARPAL TUNNEL RELEASE     Left   Colonscopy  2004   JOINT REPLACEMENT  11/2008   Left Knee Replacement   ROTATOR CUFF REPAIR     Right   TONSILLECTOMY     TOTAL KNEE ARTHROPLASTY  08/20/2011   Procedure: TOTAL KNEE ARTHROPLASTY;  Surgeon: Raymon Mutton, MD;  Location: [facility] OR;  Service: Orthopedics;  Laterality: Right;    Current Medications: Current Meds  Medication Sig   acetaminophen (TYLENOL) 325 MG tablet Take 650 mg by mouth every 4 (four) hours as needed for mild pain or moderate pain.   aspirin 81 MG chewable tablet Chew 81 mg by mouth 2 (two) times  daily.   atorvastatin (LIPITOR) 20 MG tablet Take 20 mg by mouth daily.   Calcium Carbonate-Vitamin D (CALTRATE 600+D PO) Take 1 tablet by mouth daily.   citalopram (CELEXA) 10 MG tablet Take 10 mg by mouth daily.   donepezil (ARICEPT) 10 MG tablet Take 10 mg by mouth daily.   EUTHYROX 25 MCG tablet Take 25 mcg by mouth daily.   ferrous sulfate 324 MG TBEC Take 324 mg by mouth 2 (two) times daily.   losartan (COZAAR) 25 MG tablet Take 25 mg by mouth daily.   magnesium hydroxide (MILK OF MAGNESIA) 400 MG/5ML suspension Take 30 mLs by mouth daily as needed for mild constipation.   meclizine (ANTIVERT) 25 MG tablet Take 25 mg by mouth every 6 (six) hours as needed for dizziness.   memantine (NAMENDA) 10 MG tablet Take 10 mg by mouth 2 (two) times daily.   metoprolol tartrate (LOPRESSOR) 25 MG tablet Take 1 tablet by mouth twice daily   Multiple Vitamin (MULTIVITAMIN) capsule Take 1 capsule by mouth daily.   pantoprazole (PROTONIX) 40 MG tablet Take 40 mg by mouth daily.   potassium chloride (KLOR-CON) 8 MEQ tablet Take 8 mEq by mouth daily.   senna-docusate (SENOKOT-S) 8.6-50 MG tablet Take 1 tablet by mouth 2 (two) times daily.   Vibegron 75 MG TABS Take 75 mg by mouth daily.     Allergies:   Latex, Ace inhibitors, Amoxicillin, Hydrocodone, and Prednisone   Social History   Socioeconomic History   Marital status: Married    Spouse name: Not on file   Number of children: Not on file   Years of education: 12   Highest education level: Not on file  Occupational History   Not on file  Tobacco Use   Smoking status: Never   Smokeless tobacco: Never  Substance and Sexual Activity   Alcohol use: No   Drug use: No   Sexual activity: Not on file  Other Topics Concern   Not on file  Social History Narrative   Married for 58 yrs, 2 kids, 2 grandkids.  Previously worked in a [employer], then in [employer], retired 15 yrs ago.  Graduated 12th grade, has Medicare and [employer].   Previously enjoyed Retail buyer games" (shuffleboard, etc.) until her knee problems.   Right handed   One story home   Drinks caffeine   Social Determinants of Health   Financial Resource Strain: Not on file  Food Insecurity: Not on file  Transportation Needs: Not on file  Physical Activity: Not on file  Stress: Not on file  Social Connections: Not on file     Family History: The patient's family history includes CAD in her mother. There is no history of Anesthesia problems.  ROS:   Please see the history of present illness.    All other systems reviewed and are negative.  EKGs/Labs/Other Studies Reviewed:    The  following studies were reviewed today: .Marland Kitchen  Sinus rhythm right bundle branch block and nonspecific ST-T changes      Recent Labs: 01/08/2022: TSH 0.86  Recent Lipid Panel No results found for: "CHOL", "TRIG", "HDL", "CHOLHDL", "VLDL", "LDLCALC", "LDLDIRECT"  Physical Exam:    VS:  BP (!) 120/58   Pulse 85   Ht 5\' 10"  (1.778 m)   Wt 164 lb 12.8 oz (74.8 kg)   SpO2 94%   BMI 23.65 kg/m     Wt Readings from Last 3 Encounters:  01/03/23 164 lb 12.8 oz (74.8 kg)  01/08/22 170 lb (77.1 kg)  11/22/21 170 lb 9.6 oz (77.4 kg)     GEN: Patient is in no acute distress HEENT: Normal NECK: No JVD; No carotid bruits LYMPHATICS: No lymphadenopathy CARDIAC: Hear sounds regular, 2/6 systolic murmur at the apex. RESPIRATORY:  Clear to auscultation without rales, wheezing or rhonchi  ABDOMEN: Soft, non-tender, non-distended MUSCULOSKELETAL:  No edema; No deformity  SKIN: Warm and dry NEUROLOGIC:  Alert and oriented x 3 PSYCHIATRIC:  Normal affect   Signed, Sabrina Brothers, MD  01/03/2023 11:12 AM    [facility]

## 2023-01-04 DIAGNOSIS — Z4789 Encounter for other orthopedic aftercare: Secondary | ICD-10-CM | POA: Diagnosis not present

## 2023-01-04 DIAGNOSIS — R278 Other lack of coordination: Secondary | ICD-10-CM | POA: Diagnosis not present

## 2023-01-04 DIAGNOSIS — R2681 Unsteadiness on feet: Secondary | ICD-10-CM | POA: Diagnosis not present

## 2023-01-04 DIAGNOSIS — R531 Weakness: Secondary | ICD-10-CM | POA: Diagnosis not present

## 2023-01-04 DIAGNOSIS — Z741 Need for assistance with personal care: Secondary | ICD-10-CM | POA: Diagnosis not present

## 2023-01-04 DIAGNOSIS — R2689 Other abnormalities of gait and mobility: Secondary | ICD-10-CM | POA: Diagnosis not present

## 2023-01-04 DIAGNOSIS — M6281 Muscle weakness (generalized): Secondary | ICD-10-CM | POA: Diagnosis not present

## 2023-01-07 DIAGNOSIS — R2681 Unsteadiness on feet: Secondary | ICD-10-CM | POA: Diagnosis not present

## 2023-01-07 DIAGNOSIS — Z741 Need for assistance with personal care: Secondary | ICD-10-CM | POA: Diagnosis not present

## 2023-01-07 DIAGNOSIS — M6281 Muscle weakness (generalized): Secondary | ICD-10-CM | POA: Diagnosis not present

## 2023-01-07 DIAGNOSIS — R2689 Other abnormalities of gait and mobility: Secondary | ICD-10-CM | POA: Diagnosis not present

## 2023-01-07 DIAGNOSIS — Z4789 Encounter for other orthopedic aftercare: Secondary | ICD-10-CM | POA: Diagnosis not present

## 2023-01-07 DIAGNOSIS — R531 Weakness: Secondary | ICD-10-CM | POA: Diagnosis not present

## 2023-01-07 DIAGNOSIS — R278 Other lack of coordination: Secondary | ICD-10-CM | POA: Diagnosis not present

## 2023-01-08 DIAGNOSIS — R278 Other lack of coordination: Secondary | ICD-10-CM | POA: Diagnosis not present

## 2023-01-08 DIAGNOSIS — R2681 Unsteadiness on feet: Secondary | ICD-10-CM | POA: Diagnosis not present

## 2023-01-08 DIAGNOSIS — Z4789 Encounter for other orthopedic aftercare: Secondary | ICD-10-CM | POA: Diagnosis not present

## 2023-01-08 DIAGNOSIS — M6281 Muscle weakness (generalized): Secondary | ICD-10-CM | POA: Diagnosis not present

## 2023-01-08 DIAGNOSIS — M25552 Pain in left hip: Secondary | ICD-10-CM | POA: Diagnosis not present

## 2023-01-08 DIAGNOSIS — Z741 Need for assistance with personal care: Secondary | ICD-10-CM | POA: Diagnosis not present

## 2023-01-08 DIAGNOSIS — S72142D Displaced intertrochanteric fracture of left femur, subsequent encounter for closed fracture with routine healing: Secondary | ICD-10-CM | POA: Diagnosis not present

## 2023-01-08 DIAGNOSIS — R531 Weakness: Secondary | ICD-10-CM | POA: Diagnosis not present

## 2023-01-08 DIAGNOSIS — R2689 Other abnormalities of gait and mobility: Secondary | ICD-10-CM | POA: Diagnosis not present

## 2023-01-09 DIAGNOSIS — R278 Other lack of coordination: Secondary | ICD-10-CM | POA: Diagnosis not present

## 2023-01-09 DIAGNOSIS — R2681 Unsteadiness on feet: Secondary | ICD-10-CM | POA: Diagnosis not present

## 2023-01-09 DIAGNOSIS — R531 Weakness: Secondary | ICD-10-CM | POA: Diagnosis not present

## 2023-01-09 DIAGNOSIS — R2689 Other abnormalities of gait and mobility: Secondary | ICD-10-CM | POA: Diagnosis not present

## 2023-01-09 DIAGNOSIS — Z741 Need for assistance with personal care: Secondary | ICD-10-CM | POA: Diagnosis not present

## 2023-01-09 DIAGNOSIS — M6281 Muscle weakness (generalized): Secondary | ICD-10-CM | POA: Diagnosis not present

## 2023-01-09 DIAGNOSIS — Z4789 Encounter for other orthopedic aftercare: Secondary | ICD-10-CM | POA: Diagnosis not present

## 2023-01-10 DIAGNOSIS — R531 Weakness: Secondary | ICD-10-CM | POA: Diagnosis not present

## 2023-01-10 DIAGNOSIS — R278 Other lack of coordination: Secondary | ICD-10-CM | POA: Diagnosis not present

## 2023-01-10 DIAGNOSIS — R2681 Unsteadiness on feet: Secondary | ICD-10-CM | POA: Diagnosis not present

## 2023-01-10 DIAGNOSIS — M6281 Muscle weakness (generalized): Secondary | ICD-10-CM | POA: Diagnosis not present

## 2023-01-10 DIAGNOSIS — Z4789 Encounter for other orthopedic aftercare: Secondary | ICD-10-CM | POA: Diagnosis not present

## 2023-01-10 DIAGNOSIS — R2689 Other abnormalities of gait and mobility: Secondary | ICD-10-CM | POA: Diagnosis not present

## 2023-01-10 DIAGNOSIS — Z741 Need for assistance with personal care: Secondary | ICD-10-CM | POA: Diagnosis not present

## 2023-01-14 DIAGNOSIS — R278 Other lack of coordination: Secondary | ICD-10-CM | POA: Diagnosis not present

## 2023-01-14 DIAGNOSIS — R531 Weakness: Secondary | ICD-10-CM | POA: Diagnosis not present

## 2023-01-14 DIAGNOSIS — R2689 Other abnormalities of gait and mobility: Secondary | ICD-10-CM | POA: Diagnosis not present

## 2023-01-14 DIAGNOSIS — R2681 Unsteadiness on feet: Secondary | ICD-10-CM | POA: Diagnosis not present

## 2023-01-14 DIAGNOSIS — Z741 Need for assistance with personal care: Secondary | ICD-10-CM | POA: Diagnosis not present

## 2023-01-14 DIAGNOSIS — M6281 Muscle weakness (generalized): Secondary | ICD-10-CM | POA: Diagnosis not present

## 2023-01-14 DIAGNOSIS — Z4789 Encounter for other orthopedic aftercare: Secondary | ICD-10-CM | POA: Diagnosis not present

## 2023-01-18 DIAGNOSIS — R2689 Other abnormalities of gait and mobility: Secondary | ICD-10-CM | POA: Diagnosis not present

## 2023-01-18 DIAGNOSIS — Z4789 Encounter for other orthopedic aftercare: Secondary | ICD-10-CM | POA: Diagnosis not present

## 2023-01-18 DIAGNOSIS — R2681 Unsteadiness on feet: Secondary | ICD-10-CM | POA: Diagnosis not present

## 2023-01-18 DIAGNOSIS — E039 Hypothyroidism, unspecified: Secondary | ICD-10-CM | POA: Diagnosis not present

## 2023-01-18 DIAGNOSIS — K219 Gastro-esophageal reflux disease without esophagitis: Secondary | ICD-10-CM | POA: Diagnosis not present

## 2023-01-18 DIAGNOSIS — E785 Hyperlipidemia, unspecified: Secondary | ICD-10-CM | POA: Diagnosis not present

## 2023-01-18 DIAGNOSIS — I10 Essential (primary) hypertension: Secondary | ICD-10-CM | POA: Diagnosis not present

## 2023-01-18 DIAGNOSIS — R278 Other lack of coordination: Secondary | ICD-10-CM | POA: Diagnosis not present

## 2023-01-18 DIAGNOSIS — F0390 Unspecified dementia without behavioral disturbance: Secondary | ICD-10-CM | POA: Diagnosis not present

## 2023-01-18 DIAGNOSIS — N189 Chronic kidney disease, unspecified: Secondary | ICD-10-CM | POA: Diagnosis not present

## 2023-01-18 DIAGNOSIS — M6281 Muscle weakness (generalized): Secondary | ICD-10-CM | POA: Diagnosis not present

## 2023-01-18 DIAGNOSIS — R531 Weakness: Secondary | ICD-10-CM | POA: Diagnosis not present

## 2023-01-18 DIAGNOSIS — S72142D Displaced intertrochanteric fracture of left femur, subsequent encounter for closed fracture with routine healing: Secondary | ICD-10-CM | POA: Diagnosis not present

## 2023-01-18 DIAGNOSIS — Z741 Need for assistance with personal care: Secondary | ICD-10-CM | POA: Diagnosis not present

## 2023-01-18 DIAGNOSIS — I480 Paroxysmal atrial fibrillation: Secondary | ICD-10-CM | POA: Diagnosis not present

## 2023-01-19 DIAGNOSIS — R2689 Other abnormalities of gait and mobility: Secondary | ICD-10-CM | POA: Diagnosis not present

## 2023-01-19 DIAGNOSIS — Z741 Need for assistance with personal care: Secondary | ICD-10-CM | POA: Diagnosis not present

## 2023-01-19 DIAGNOSIS — R2681 Unsteadiness on feet: Secondary | ICD-10-CM | POA: Diagnosis not present

## 2023-01-19 DIAGNOSIS — Z4789 Encounter for other orthopedic aftercare: Secondary | ICD-10-CM | POA: Diagnosis not present

## 2023-01-19 DIAGNOSIS — M6281 Muscle weakness (generalized): Secondary | ICD-10-CM | POA: Diagnosis not present

## 2023-01-19 DIAGNOSIS — R531 Weakness: Secondary | ICD-10-CM | POA: Diagnosis not present

## 2023-01-19 DIAGNOSIS — R278 Other lack of coordination: Secondary | ICD-10-CM | POA: Diagnosis not present

## 2023-02-08 DIAGNOSIS — Z23 Encounter for immunization: Secondary | ICD-10-CM | POA: Diagnosis not present

## 2023-02-12 DIAGNOSIS — Z23 Encounter for immunization: Secondary | ICD-10-CM | POA: Diagnosis not present

## 2023-02-12 DIAGNOSIS — W19XXXA Unspecified fall, initial encounter: Secondary | ICD-10-CM | POA: Diagnosis not present

## 2023-02-12 DIAGNOSIS — R531 Weakness: Secondary | ICD-10-CM | POA: Diagnosis not present

## 2023-02-14 DIAGNOSIS — R600 Localized edema: Secondary | ICD-10-CM | POA: Diagnosis not present

## 2023-02-18 DIAGNOSIS — R2689 Other abnormalities of gait and mobility: Secondary | ICD-10-CM | POA: Diagnosis not present

## 2023-02-18 DIAGNOSIS — R278 Other lack of coordination: Secondary | ICD-10-CM | POA: Diagnosis not present

## 2023-02-18 DIAGNOSIS — R531 Weakness: Secondary | ICD-10-CM | POA: Diagnosis not present

## 2023-02-18 DIAGNOSIS — E039 Hypothyroidism, unspecified: Secondary | ICD-10-CM | POA: Diagnosis not present

## 2023-02-18 DIAGNOSIS — N189 Chronic kidney disease, unspecified: Secondary | ICD-10-CM | POA: Diagnosis not present

## 2023-02-18 DIAGNOSIS — F0390 Unspecified dementia without behavioral disturbance: Secondary | ICD-10-CM | POA: Diagnosis not present

## 2023-02-18 DIAGNOSIS — Z4789 Encounter for other orthopedic aftercare: Secondary | ICD-10-CM | POA: Diagnosis not present

## 2023-02-19 DIAGNOSIS — E785 Hyperlipidemia, unspecified: Secondary | ICD-10-CM | POA: Diagnosis not present

## 2023-02-19 DIAGNOSIS — I10 Essential (primary) hypertension: Secondary | ICD-10-CM | POA: Diagnosis not present

## 2023-02-19 DIAGNOSIS — Z23 Encounter for immunization: Secondary | ICD-10-CM | POA: Diagnosis not present

## 2023-02-19 DIAGNOSIS — R531 Weakness: Secondary | ICD-10-CM | POA: Diagnosis not present

## 2023-02-19 DIAGNOSIS — Z4789 Encounter for other orthopedic aftercare: Secondary | ICD-10-CM | POA: Diagnosis not present

## 2023-02-19 DIAGNOSIS — E039 Hypothyroidism, unspecified: Secondary | ICD-10-CM | POA: Diagnosis not present

## 2023-02-19 DIAGNOSIS — R278 Other lack of coordination: Secondary | ICD-10-CM | POA: Diagnosis not present

## 2023-02-19 DIAGNOSIS — R2689 Other abnormalities of gait and mobility: Secondary | ICD-10-CM | POA: Diagnosis not present

## 2023-02-20 DIAGNOSIS — R278 Other lack of coordination: Secondary | ICD-10-CM | POA: Diagnosis not present

## 2023-02-20 DIAGNOSIS — Z4789 Encounter for other orthopedic aftercare: Secondary | ICD-10-CM | POA: Diagnosis not present

## 2023-02-20 DIAGNOSIS — R2689 Other abnormalities of gait and mobility: Secondary | ICD-10-CM | POA: Diagnosis not present

## 2023-02-20 DIAGNOSIS — R531 Weakness: Secondary | ICD-10-CM | POA: Diagnosis not present

## 2023-02-21 DIAGNOSIS — Z4789 Encounter for other orthopedic aftercare: Secondary | ICD-10-CM | POA: Diagnosis not present

## 2023-02-21 DIAGNOSIS — R2689 Other abnormalities of gait and mobility: Secondary | ICD-10-CM | POA: Diagnosis not present

## 2023-02-21 DIAGNOSIS — R278 Other lack of coordination: Secondary | ICD-10-CM | POA: Diagnosis not present

## 2023-02-21 DIAGNOSIS — I739 Peripheral vascular disease, unspecified: Secondary | ICD-10-CM | POA: Diagnosis not present

## 2023-02-21 DIAGNOSIS — E1151 Type 2 diabetes mellitus with diabetic peripheral angiopathy without gangrene: Secondary | ICD-10-CM | POA: Diagnosis not present

## 2023-02-21 DIAGNOSIS — B351 Tinea unguium: Secondary | ICD-10-CM | POA: Diagnosis not present

## 2023-02-21 DIAGNOSIS — L603 Nail dystrophy: Secondary | ICD-10-CM | POA: Diagnosis not present

## 2023-02-21 DIAGNOSIS — R531 Weakness: Secondary | ICD-10-CM | POA: Diagnosis not present

## 2023-02-22 DIAGNOSIS — R2689 Other abnormalities of gait and mobility: Secondary | ICD-10-CM | POA: Diagnosis not present

## 2023-02-22 DIAGNOSIS — R278 Other lack of coordination: Secondary | ICD-10-CM | POA: Diagnosis not present

## 2023-02-22 DIAGNOSIS — R531 Weakness: Secondary | ICD-10-CM | POA: Diagnosis not present

## 2023-02-22 DIAGNOSIS — Z4789 Encounter for other orthopedic aftercare: Secondary | ICD-10-CM | POA: Diagnosis not present

## 2023-02-26 DIAGNOSIS — E785 Hyperlipidemia, unspecified: Secondary | ICD-10-CM | POA: Diagnosis not present

## 2023-02-26 DIAGNOSIS — E039 Hypothyroidism, unspecified: Secondary | ICD-10-CM | POA: Diagnosis not present

## 2023-02-26 DIAGNOSIS — N189 Chronic kidney disease, unspecified: Secondary | ICD-10-CM | POA: Diagnosis not present

## 2023-02-28 DIAGNOSIS — R531 Weakness: Secondary | ICD-10-CM | POA: Diagnosis not present

## 2023-02-28 DIAGNOSIS — R2689 Other abnormalities of gait and mobility: Secondary | ICD-10-CM | POA: Diagnosis not present

## 2023-02-28 DIAGNOSIS — Z4789 Encounter for other orthopedic aftercare: Secondary | ICD-10-CM | POA: Diagnosis not present

## 2023-02-28 DIAGNOSIS — R278 Other lack of coordination: Secondary | ICD-10-CM | POA: Diagnosis not present

## 2023-03-01 DIAGNOSIS — R2689 Other abnormalities of gait and mobility: Secondary | ICD-10-CM | POA: Diagnosis not present

## 2023-03-01 DIAGNOSIS — R278 Other lack of coordination: Secondary | ICD-10-CM | POA: Diagnosis not present

## 2023-03-01 DIAGNOSIS — R531 Weakness: Secondary | ICD-10-CM | POA: Diagnosis not present

## 2023-03-01 DIAGNOSIS — Z4789 Encounter for other orthopedic aftercare: Secondary | ICD-10-CM | POA: Diagnosis not present

## 2023-03-04 DIAGNOSIS — R531 Weakness: Secondary | ICD-10-CM | POA: Diagnosis not present

## 2023-03-04 DIAGNOSIS — R2689 Other abnormalities of gait and mobility: Secondary | ICD-10-CM | POA: Diagnosis not present

## 2023-03-04 DIAGNOSIS — R278 Other lack of coordination: Secondary | ICD-10-CM | POA: Diagnosis not present

## 2023-03-04 DIAGNOSIS — Z4789 Encounter for other orthopedic aftercare: Secondary | ICD-10-CM | POA: Diagnosis not present

## 2023-03-07 DIAGNOSIS — F32A Depression, unspecified: Secondary | ICD-10-CM | POA: Diagnosis not present

## 2023-03-07 DIAGNOSIS — Z9181 History of falling: Secondary | ICD-10-CM | POA: Diagnosis not present

## 2023-03-20 DIAGNOSIS — Z961 Presence of intraocular lens: Secondary | ICD-10-CM | POA: Diagnosis not present

## 2023-03-25 ENCOUNTER — Ambulatory Visit (INDEPENDENT_AMBULATORY_CARE_PROVIDER_SITE_OTHER): Payer: Medicare HMO

## 2023-03-25 DIAGNOSIS — I495 Sick sinus syndrome: Secondary | ICD-10-CM | POA: Diagnosis not present

## 2023-03-25 LAB — CUP PACEART REMOTE DEVICE CHECK
Battery Remaining Longevity: 110 mo
Battery Voltage: 3.01 V
Brady Statistic AP VP Percent: 0.06 %
Brady Statistic AP VS Percent: 49.37 %
Brady Statistic AS VP Percent: 0.02 %
Brady Statistic AS VS Percent: 50.56 %
Brady Statistic RA Percent Paced: 49.72 %
Brady Statistic RV Percent Paced: 0.1 %
Date Time Interrogation Session: 20241110220121
Implantable Lead Connection Status: 753985
Implantable Lead Connection Status: 753985
Implantable Lead Implant Date: 20201124
Implantable Lead Implant Date: 20201124
Implantable Lead Location: 753859
Implantable Lead Location: 753860
Implantable Lead Model: 5076
Implantable Lead Model: 5076
Implantable Pulse Generator Implant Date: 20201124
Lead Channel Impedance Value: 342 Ohm
Lead Channel Impedance Value: 399 Ohm
Lead Channel Impedance Value: 418 Ohm
Lead Channel Impedance Value: 494 Ohm
Lead Channel Pacing Threshold Amplitude: 0.5 V
Lead Channel Pacing Threshold Amplitude: 0.625 V
Lead Channel Pacing Threshold Pulse Width: 0.4 ms
Lead Channel Pacing Threshold Pulse Width: 0.4 ms
Lead Channel Sensing Intrinsic Amplitude: 2.375 mV
Lead Channel Sensing Intrinsic Amplitude: 2.375 mV
Lead Channel Sensing Intrinsic Amplitude: 4.75 mV
Lead Channel Sensing Intrinsic Amplitude: 4.75 mV
Lead Channel Setting Pacing Amplitude: 1.5 V
Lead Channel Setting Pacing Amplitude: 2 V
Lead Channel Setting Pacing Pulse Width: 0.4 ms
Lead Channel Setting Sensing Sensitivity: 1.2 mV
Zone Setting Status: 755011
Zone Setting Status: 755011

## 2023-04-03 DIAGNOSIS — E039 Hypothyroidism, unspecified: Secondary | ICD-10-CM | POA: Diagnosis not present

## 2023-04-03 DIAGNOSIS — R600 Localized edema: Secondary | ICD-10-CM | POA: Diagnosis not present

## 2023-04-17 NOTE — Progress Notes (Signed)
Remote pacemaker transmission.   

## 2023-04-26 DIAGNOSIS — B351 Tinea unguium: Secondary | ICD-10-CM | POA: Diagnosis not present

## 2023-04-26 DIAGNOSIS — E1151 Type 2 diabetes mellitus with diabetic peripheral angiopathy without gangrene: Secondary | ICD-10-CM | POA: Diagnosis not present

## 2023-04-26 DIAGNOSIS — L603 Nail dystrophy: Secondary | ICD-10-CM | POA: Diagnosis not present

## 2023-06-12 DIAGNOSIS — E785 Hyperlipidemia, unspecified: Secondary | ICD-10-CM | POA: Diagnosis not present

## 2023-06-12 DIAGNOSIS — I10 Essential (primary) hypertension: Secondary | ICD-10-CM | POA: Diagnosis not present

## 2023-06-12 DIAGNOSIS — D509 Iron deficiency anemia, unspecified: Secondary | ICD-10-CM | POA: Diagnosis not present

## 2023-06-12 DIAGNOSIS — F0150 Vascular dementia without behavioral disturbance: Secondary | ICD-10-CM | POA: Diagnosis not present

## 2023-06-12 DIAGNOSIS — N3281 Overactive bladder: Secondary | ICD-10-CM | POA: Diagnosis not present

## 2023-06-12 DIAGNOSIS — N189 Chronic kidney disease, unspecified: Secondary | ICD-10-CM | POA: Diagnosis not present

## 2023-06-12 DIAGNOSIS — E039 Hypothyroidism, unspecified: Secondary | ICD-10-CM | POA: Diagnosis not present

## 2023-06-12 DIAGNOSIS — F32A Depression, unspecified: Secondary | ICD-10-CM | POA: Diagnosis not present

## 2023-06-24 ENCOUNTER — Ambulatory Visit (INDEPENDENT_AMBULATORY_CARE_PROVIDER_SITE_OTHER): Payer: Medicare HMO

## 2023-06-24 DIAGNOSIS — I495 Sick sinus syndrome: Secondary | ICD-10-CM | POA: Diagnosis not present

## 2023-06-25 LAB — CUP PACEART REMOTE DEVICE CHECK
Battery Remaining Longevity: 108 mo
Battery Voltage: 3.01 V
Brady Statistic AP VP Percent: 0.08 %
Brady Statistic AP VS Percent: 40.35 %
Brady Statistic AS VP Percent: 0.03 %
Brady Statistic AS VS Percent: 59.54 %
Brady Statistic RA Percent Paced: 40.6 %
Brady Statistic RV Percent Paced: 0.11 %
Date Time Interrogation Session: 20250208221333
Implantable Lead Connection Status: 753985
Implantable Lead Connection Status: 753985
Implantable Lead Implant Date: 20201124
Implantable Lead Implant Date: 20201124
Implantable Lead Location: 753859
Implantable Lead Location: 753860
Implantable Lead Model: 5076
Implantable Lead Model: 5076
Implantable Pulse Generator Implant Date: 20201124
Lead Channel Impedance Value: 361 Ohm
Lead Channel Impedance Value: 361 Ohm
Lead Channel Impedance Value: 399 Ohm
Lead Channel Impedance Value: 456 Ohm
Lead Channel Pacing Threshold Amplitude: 0.5 V
Lead Channel Pacing Threshold Amplitude: 0.625 V
Lead Channel Pacing Threshold Pulse Width: 0.4 ms
Lead Channel Pacing Threshold Pulse Width: 0.4 ms
Lead Channel Sensing Intrinsic Amplitude: 2.5 mV
Lead Channel Sensing Intrinsic Amplitude: 2.5 mV
Lead Channel Sensing Intrinsic Amplitude: 4.375 mV
Lead Channel Sensing Intrinsic Amplitude: 4.375 mV
Lead Channel Setting Pacing Amplitude: 1.5 V
Lead Channel Setting Pacing Amplitude: 2 V
Lead Channel Setting Pacing Pulse Width: 0.4 ms
Lead Channel Setting Sensing Sensitivity: 1.2 mV
Zone Setting Status: 755011
Zone Setting Status: 755011

## 2023-06-27 DIAGNOSIS — E119 Type 2 diabetes mellitus without complications: Secondary | ICD-10-CM | POA: Diagnosis not present

## 2023-07-10 DIAGNOSIS — R609 Edema, unspecified: Secondary | ICD-10-CM | POA: Diagnosis not present

## 2023-07-29 NOTE — Progress Notes (Signed)
 Remote pacemaker transmission.

## 2023-07-29 NOTE — Addendum Note (Signed)
 Addended by: Geralyn Flash D on: 07/29/2023 10:07 AM   Modules accepted: Orders

## 2023-08-01 DIAGNOSIS — F0150 Vascular dementia without behavioral disturbance: Secondary | ICD-10-CM | POA: Diagnosis not present

## 2023-08-01 DIAGNOSIS — R609 Edema, unspecified: Secondary | ICD-10-CM | POA: Diagnosis not present

## 2023-08-01 DIAGNOSIS — I10 Essential (primary) hypertension: Secondary | ICD-10-CM | POA: Diagnosis not present

## 2023-08-07 DIAGNOSIS — E871 Hypo-osmolality and hyponatremia: Secondary | ICD-10-CM | POA: Diagnosis not present

## 2023-08-07 DIAGNOSIS — N3281 Overactive bladder: Secondary | ICD-10-CM | POA: Diagnosis not present

## 2023-08-07 DIAGNOSIS — E039 Hypothyroidism, unspecified: Secondary | ICD-10-CM | POA: Diagnosis not present

## 2023-08-07 DIAGNOSIS — F0150 Vascular dementia without behavioral disturbance: Secondary | ICD-10-CM | POA: Diagnosis not present

## 2023-08-07 DIAGNOSIS — K219 Gastro-esophageal reflux disease without esophagitis: Secondary | ICD-10-CM | POA: Diagnosis not present

## 2023-08-07 DIAGNOSIS — I10 Essential (primary) hypertension: Secondary | ICD-10-CM | POA: Diagnosis not present

## 2023-08-07 DIAGNOSIS — D509 Iron deficiency anemia, unspecified: Secondary | ICD-10-CM | POA: Diagnosis not present

## 2023-08-15 DIAGNOSIS — E785 Hyperlipidemia, unspecified: Secondary | ICD-10-CM | POA: Diagnosis not present

## 2023-08-15 DIAGNOSIS — I480 Paroxysmal atrial fibrillation: Secondary | ICD-10-CM | POA: Diagnosis not present

## 2023-09-12 DIAGNOSIS — F32A Depression, unspecified: Secondary | ICD-10-CM | POA: Diagnosis not present

## 2023-09-12 DIAGNOSIS — R609 Edema, unspecified: Secondary | ICD-10-CM | POA: Diagnosis not present

## 2023-09-12 DIAGNOSIS — F0150 Vascular dementia without behavioral disturbance: Secondary | ICD-10-CM | POA: Diagnosis not present

## 2023-09-16 DIAGNOSIS — R2681 Unsteadiness on feet: Secondary | ICD-10-CM | POA: Diagnosis not present

## 2023-09-16 DIAGNOSIS — M6281 Muscle weakness (generalized): Secondary | ICD-10-CM | POA: Diagnosis not present

## 2023-09-16 DIAGNOSIS — E0800 Diabetes mellitus due to underlying condition with hyperosmolarity without nonketotic hyperglycemic-hyperosmolar coma (NKHHC): Secondary | ICD-10-CM | POA: Diagnosis not present

## 2023-09-17 DIAGNOSIS — R2681 Unsteadiness on feet: Secondary | ICD-10-CM | POA: Diagnosis not present

## 2023-09-17 DIAGNOSIS — M6281 Muscle weakness (generalized): Secondary | ICD-10-CM | POA: Diagnosis not present

## 2023-09-18 DIAGNOSIS — M6281 Muscle weakness (generalized): Secondary | ICD-10-CM | POA: Diagnosis not present

## 2023-09-18 DIAGNOSIS — R2681 Unsteadiness on feet: Secondary | ICD-10-CM | POA: Diagnosis not present

## 2023-09-20 DIAGNOSIS — M6281 Muscle weakness (generalized): Secondary | ICD-10-CM | POA: Diagnosis not present

## 2023-09-20 DIAGNOSIS — R2681 Unsteadiness on feet: Secondary | ICD-10-CM | POA: Diagnosis not present

## 2023-09-22 DIAGNOSIS — R2681 Unsteadiness on feet: Secondary | ICD-10-CM | POA: Diagnosis not present

## 2023-09-22 DIAGNOSIS — M6281 Muscle weakness (generalized): Secondary | ICD-10-CM | POA: Diagnosis not present

## 2023-09-23 ENCOUNTER — Ambulatory Visit (INDEPENDENT_AMBULATORY_CARE_PROVIDER_SITE_OTHER): Payer: Medicare HMO

## 2023-09-23 DIAGNOSIS — E119 Type 2 diabetes mellitus without complications: Secondary | ICD-10-CM | POA: Diagnosis not present

## 2023-09-23 DIAGNOSIS — I495 Sick sinus syndrome: Secondary | ICD-10-CM

## 2023-09-23 DIAGNOSIS — R2681 Unsteadiness on feet: Secondary | ICD-10-CM | POA: Diagnosis not present

## 2023-09-23 DIAGNOSIS — M6281 Muscle weakness (generalized): Secondary | ICD-10-CM | POA: Diagnosis not present

## 2023-09-24 LAB — CUP PACEART REMOTE DEVICE CHECK
Battery Remaining Longevity: 107 mo
Battery Voltage: 3.01 V
Brady Statistic AP VP Percent: 0.13 %
Brady Statistic AP VS Percent: 56.68 %
Brady Statistic AS VP Percent: 0.03 %
Brady Statistic AS VS Percent: 43.16 %
Brady Statistic RA Percent Paced: 57.29 %
Brady Statistic RV Percent Paced: 0.16 %
Date Time Interrogation Session: 20250511205044
Implantable Lead Connection Status: 753985
Implantable Lead Connection Status: 753985
Implantable Lead Implant Date: 20201124
Implantable Lead Implant Date: 20201124
Implantable Lead Location: 753859
Implantable Lead Location: 753860
Implantable Lead Model: 5076
Implantable Lead Model: 5076
Implantable Pulse Generator Implant Date: 20201124
Lead Channel Impedance Value: 380 Ohm
Lead Channel Impedance Value: 418 Ohm
Lead Channel Impedance Value: 437 Ohm
Lead Channel Impedance Value: 532 Ohm
Lead Channel Pacing Threshold Amplitude: 0.5 V
Lead Channel Pacing Threshold Amplitude: 0.625 V
Lead Channel Pacing Threshold Pulse Width: 0.4 ms
Lead Channel Pacing Threshold Pulse Width: 0.4 ms
Lead Channel Sensing Intrinsic Amplitude: 2.625 mV
Lead Channel Sensing Intrinsic Amplitude: 2.625 mV
Lead Channel Sensing Intrinsic Amplitude: 4.875 mV
Lead Channel Sensing Intrinsic Amplitude: 4.875 mV
Lead Channel Setting Pacing Amplitude: 1.5 V
Lead Channel Setting Pacing Amplitude: 2 V
Lead Channel Setting Pacing Pulse Width: 0.4 ms
Lead Channel Setting Sensing Sensitivity: 1.2 mV
Zone Setting Status: 755011
Zone Setting Status: 755011

## 2023-09-25 DIAGNOSIS — R2681 Unsteadiness on feet: Secondary | ICD-10-CM | POA: Diagnosis not present

## 2023-09-25 DIAGNOSIS — M6281 Muscle weakness (generalized): Secondary | ICD-10-CM | POA: Diagnosis not present

## 2023-09-26 DIAGNOSIS — I10 Essential (primary) hypertension: Secondary | ICD-10-CM | POA: Diagnosis not present

## 2023-09-30 ENCOUNTER — Ambulatory Visit: Payer: Self-pay | Admitting: Cardiology

## 2023-09-30 DIAGNOSIS — M6281 Muscle weakness (generalized): Secondary | ICD-10-CM | POA: Diagnosis not present

## 2023-09-30 DIAGNOSIS — R2681 Unsteadiness on feet: Secondary | ICD-10-CM | POA: Diagnosis not present

## 2023-10-01 DIAGNOSIS — M6281 Muscle weakness (generalized): Secondary | ICD-10-CM | POA: Diagnosis not present

## 2023-10-01 DIAGNOSIS — R2681 Unsteadiness on feet: Secondary | ICD-10-CM | POA: Diagnosis not present

## 2023-10-08 DIAGNOSIS — R2681 Unsteadiness on feet: Secondary | ICD-10-CM | POA: Diagnosis not present

## 2023-10-08 DIAGNOSIS — I480 Paroxysmal atrial fibrillation: Secondary | ICD-10-CM | POA: Diagnosis not present

## 2023-10-08 DIAGNOSIS — D509 Iron deficiency anemia, unspecified: Secondary | ICD-10-CM | POA: Diagnosis not present

## 2023-10-08 DIAGNOSIS — M6281 Muscle weakness (generalized): Secondary | ICD-10-CM | POA: Diagnosis not present

## 2023-10-08 DIAGNOSIS — K219 Gastro-esophageal reflux disease without esophagitis: Secondary | ICD-10-CM | POA: Diagnosis not present

## 2023-10-17 DIAGNOSIS — R2681 Unsteadiness on feet: Secondary | ICD-10-CM | POA: Diagnosis not present

## 2023-10-17 DIAGNOSIS — M6281 Muscle weakness (generalized): Secondary | ICD-10-CM | POA: Diagnosis not present

## 2023-10-21 DIAGNOSIS — M6281 Muscle weakness (generalized): Secondary | ICD-10-CM | POA: Diagnosis not present

## 2023-10-21 DIAGNOSIS — R2681 Unsteadiness on feet: Secondary | ICD-10-CM | POA: Diagnosis not present

## 2023-10-22 DIAGNOSIS — N3281 Overactive bladder: Secondary | ICD-10-CM | POA: Diagnosis not present

## 2023-10-22 DIAGNOSIS — F0150 Vascular dementia without behavioral disturbance: Secondary | ICD-10-CM | POA: Diagnosis not present

## 2023-10-23 DIAGNOSIS — M6281 Muscle weakness (generalized): Secondary | ICD-10-CM | POA: Diagnosis not present

## 2023-10-23 DIAGNOSIS — R2681 Unsteadiness on feet: Secondary | ICD-10-CM | POA: Diagnosis not present

## 2023-10-25 DIAGNOSIS — R2681 Unsteadiness on feet: Secondary | ICD-10-CM | POA: Diagnosis not present

## 2023-10-25 DIAGNOSIS — M6281 Muscle weakness (generalized): Secondary | ICD-10-CM | POA: Diagnosis not present

## 2023-10-28 DIAGNOSIS — R2681 Unsteadiness on feet: Secondary | ICD-10-CM | POA: Diagnosis not present

## 2023-10-28 DIAGNOSIS — M6281 Muscle weakness (generalized): Secondary | ICD-10-CM | POA: Diagnosis not present

## 2023-10-29 DIAGNOSIS — R2681 Unsteadiness on feet: Secondary | ICD-10-CM | POA: Diagnosis not present

## 2023-10-29 DIAGNOSIS — M6281 Muscle weakness (generalized): Secondary | ICD-10-CM | POA: Diagnosis not present

## 2023-10-30 DIAGNOSIS — M6281 Muscle weakness (generalized): Secondary | ICD-10-CM | POA: Diagnosis not present

## 2023-10-30 DIAGNOSIS — R2681 Unsteadiness on feet: Secondary | ICD-10-CM | POA: Diagnosis not present

## 2023-10-31 DIAGNOSIS — M6281 Muscle weakness (generalized): Secondary | ICD-10-CM | POA: Diagnosis not present

## 2023-10-31 DIAGNOSIS — R2681 Unsteadiness on feet: Secondary | ICD-10-CM | POA: Diagnosis not present

## 2023-11-01 DIAGNOSIS — M6281 Muscle weakness (generalized): Secondary | ICD-10-CM | POA: Diagnosis not present

## 2023-11-01 DIAGNOSIS — R2681 Unsteadiness on feet: Secondary | ICD-10-CM | POA: Diagnosis not present

## 2023-11-06 NOTE — Progress Notes (Signed)
 Remote pacemaker transmission.

## 2023-11-06 NOTE — Addendum Note (Signed)
 Addended by: TAWNI DRILLING D on: 11/06/2023 11:50 AM   Modules accepted: Orders

## 2023-11-26 DIAGNOSIS — R609 Edema, unspecified: Secondary | ICD-10-CM | POA: Diagnosis not present

## 2023-11-26 DIAGNOSIS — L603 Nail dystrophy: Secondary | ICD-10-CM | POA: Diagnosis not present

## 2023-11-26 DIAGNOSIS — E1151 Type 2 diabetes mellitus with diabetic peripheral angiopathy without gangrene: Secondary | ICD-10-CM | POA: Diagnosis not present

## 2023-11-26 DIAGNOSIS — I10 Essential (primary) hypertension: Secondary | ICD-10-CM | POA: Diagnosis not present

## 2023-11-28 ENCOUNTER — Telehealth: Payer: Self-pay

## 2023-11-28 NOTE — Telephone Encounter (Signed)
 Alert received from [employer] for recent AF/AFL, not always good rate control, burden 5.8%, Eliquis  per EPIC notes, not on MAR. Presenting on most recent remote on 11/28/23 was NSR.   Patient is overdue in clinic with EP and needs apt. Attempted to contact patient. No answer, left message to call back.

## 2023-12-02 DIAGNOSIS — R413 Other amnesia: Secondary | ICD-10-CM | POA: Diagnosis not present

## 2023-12-02 DIAGNOSIS — F0390 Unspecified dementia without behavioral disturbance: Secondary | ICD-10-CM | POA: Diagnosis not present

## 2023-12-02 DIAGNOSIS — Z719 Counseling, unspecified: Secondary | ICD-10-CM | POA: Diagnosis not present

## 2023-12-02 NOTE — Telephone Encounter (Signed)
 [facility] - to assess symptoms and get set up for EP follow up - past due.

## 2023-12-03 NOTE — Telephone Encounter (Signed)
 Pt last seen in 2023.  Message sent to scheduler to set up overdue follow up appointment to assess Pt.  Await further needs.

## 2023-12-09 DIAGNOSIS — E871 Hypo-osmolality and hyponatremia: Secondary | ICD-10-CM | POA: Diagnosis not present

## 2023-12-09 DIAGNOSIS — I10 Essential (primary) hypertension: Secondary | ICD-10-CM | POA: Diagnosis not present

## 2023-12-09 DIAGNOSIS — E039 Hypothyroidism, unspecified: Secondary | ICD-10-CM | POA: Diagnosis not present

## 2023-12-09 DIAGNOSIS — N3281 Overactive bladder: Secondary | ICD-10-CM | POA: Diagnosis not present

## 2023-12-09 DIAGNOSIS — F32A Depression, unspecified: Secondary | ICD-10-CM | POA: Diagnosis not present

## 2023-12-09 DIAGNOSIS — F0150 Vascular dementia without behavioral disturbance: Secondary | ICD-10-CM | POA: Diagnosis not present

## 2023-12-09 DIAGNOSIS — D509 Iron deficiency anemia, unspecified: Secondary | ICD-10-CM | POA: Diagnosis not present

## 2023-12-09 DIAGNOSIS — K219 Gastro-esophageal reflux disease without esophagitis: Secondary | ICD-10-CM | POA: Diagnosis not present

## 2023-12-12 DIAGNOSIS — F32A Depression, unspecified: Secondary | ICD-10-CM | POA: Diagnosis not present

## 2023-12-12 DIAGNOSIS — F0150 Vascular dementia without behavioral disturbance: Secondary | ICD-10-CM | POA: Diagnosis not present

## 2023-12-17 DIAGNOSIS — I10 Essential (primary) hypertension: Secondary | ICD-10-CM | POA: Diagnosis not present

## 2023-12-17 DIAGNOSIS — E119 Type 2 diabetes mellitus without complications: Secondary | ICD-10-CM | POA: Diagnosis not present

## 2023-12-23 ENCOUNTER — Ambulatory Visit: Payer: Medicare HMO

## 2023-12-23 ENCOUNTER — Ambulatory Visit: Payer: Self-pay | Admitting: Cardiology

## 2023-12-23 DIAGNOSIS — B370 Candidal stomatitis: Secondary | ICD-10-CM | POA: Diagnosis not present

## 2023-12-23 DIAGNOSIS — N189 Chronic kidney disease, unspecified: Secondary | ICD-10-CM | POA: Diagnosis not present

## 2023-12-23 DIAGNOSIS — I495 Sick sinus syndrome: Secondary | ICD-10-CM

## 2023-12-23 LAB — CUP PACEART REMOTE DEVICE CHECK
Battery Remaining Longevity: 104 mo
Battery Voltage: 3.01 V
Brady Statistic AP VP Percent: 0.43 %
Brady Statistic AP VS Percent: 70.51 %
Brady Statistic AS VP Percent: 0.02 %
Brady Statistic AS VS Percent: 29.04 %
Brady Statistic RA Percent Paced: 71.28 %
Brady Statistic RV Percent Paced: 0.45 %
Date Time Interrogation Session: 20250810233406
Implantable Lead Connection Status: 753985
Implantable Lead Connection Status: 753985
Implantable Lead Implant Date: 20201124
Implantable Lead Implant Date: 20201124
Implantable Lead Location: 753859
Implantable Lead Location: 753860
Implantable Lead Model: 5076
Implantable Lead Model: 5076
Implantable Pulse Generator Implant Date: 20201124
Lead Channel Impedance Value: 361 Ohm
Lead Channel Impedance Value: 399 Ohm
Lead Channel Impedance Value: 418 Ohm
Lead Channel Impedance Value: 494 Ohm
Lead Channel Pacing Threshold Amplitude: 0.5 V
Lead Channel Pacing Threshold Amplitude: 0.625 V
Lead Channel Pacing Threshold Pulse Width: 0.4 ms
Lead Channel Pacing Threshold Pulse Width: 0.4 ms
Lead Channel Sensing Intrinsic Amplitude: 3 mV
Lead Channel Sensing Intrinsic Amplitude: 3 mV
Lead Channel Sensing Intrinsic Amplitude: 4.125 mV
Lead Channel Sensing Intrinsic Amplitude: 4.125 mV
Lead Channel Setting Pacing Amplitude: 1.5 V
Lead Channel Setting Pacing Amplitude: 2 V
Lead Channel Setting Pacing Pulse Width: 0.4 ms
Lead Channel Setting Sensing Sensitivity: 1.2 mV
Zone Setting Status: 755011
Zone Setting Status: 755011

## 2024-01-07 DIAGNOSIS — I10 Essential (primary) hypertension: Secondary | ICD-10-CM | POA: Diagnosis not present

## 2024-01-07 DIAGNOSIS — F0150 Vascular dementia without behavioral disturbance: Secondary | ICD-10-CM | POA: Diagnosis not present

## 2024-01-13 DIAGNOSIS — N189 Chronic kidney disease, unspecified: Secondary | ICD-10-CM | POA: Diagnosis not present

## 2024-01-13 DIAGNOSIS — K219 Gastro-esophageal reflux disease without esophagitis: Secondary | ICD-10-CM | POA: Diagnosis not present

## 2024-01-13 DIAGNOSIS — F0150 Vascular dementia without behavioral disturbance: Secondary | ICD-10-CM | POA: Diagnosis not present

## 2024-01-13 DIAGNOSIS — E871 Hypo-osmolality and hyponatremia: Secondary | ICD-10-CM | POA: Diagnosis not present

## 2024-01-13 DIAGNOSIS — N3281 Overactive bladder: Secondary | ICD-10-CM | POA: Diagnosis not present

## 2024-01-13 DIAGNOSIS — E039 Hypothyroidism, unspecified: Secondary | ICD-10-CM | POA: Diagnosis not present

## 2024-01-13 DIAGNOSIS — D509 Iron deficiency anemia, unspecified: Secondary | ICD-10-CM | POA: Diagnosis not present

## 2024-01-13 DIAGNOSIS — I10 Essential (primary) hypertension: Secondary | ICD-10-CM | POA: Diagnosis not present

## 2024-01-23 DIAGNOSIS — I480 Paroxysmal atrial fibrillation: Secondary | ICD-10-CM | POA: Diagnosis not present

## 2024-01-23 DIAGNOSIS — I10 Essential (primary) hypertension: Secondary | ICD-10-CM | POA: Diagnosis not present

## 2024-01-23 DIAGNOSIS — F0150 Vascular dementia without behavioral disturbance: Secondary | ICD-10-CM | POA: Diagnosis not present

## 2024-01-29 DIAGNOSIS — M6281 Muscle weakness (generalized): Secondary | ICD-10-CM | POA: Diagnosis not present

## 2024-01-29 DIAGNOSIS — F0390 Unspecified dementia without behavioral disturbance: Secondary | ICD-10-CM | POA: Diagnosis not present

## 2024-01-30 DIAGNOSIS — M6281 Muscle weakness (generalized): Secondary | ICD-10-CM | POA: Diagnosis not present

## 2024-01-30 DIAGNOSIS — F0390 Unspecified dementia without behavioral disturbance: Secondary | ICD-10-CM | POA: Diagnosis not present

## 2024-01-31 DIAGNOSIS — M6281 Muscle weakness (generalized): Secondary | ICD-10-CM | POA: Diagnosis not present

## 2024-01-31 DIAGNOSIS — F0390 Unspecified dementia without behavioral disturbance: Secondary | ICD-10-CM | POA: Diagnosis not present

## 2024-02-01 DIAGNOSIS — F0390 Unspecified dementia without behavioral disturbance: Secondary | ICD-10-CM | POA: Diagnosis not present

## 2024-02-01 DIAGNOSIS — M6281 Muscle weakness (generalized): Secondary | ICD-10-CM | POA: Diagnosis not present

## 2024-02-02 DIAGNOSIS — F0390 Unspecified dementia without behavioral disturbance: Secondary | ICD-10-CM | POA: Diagnosis not present

## 2024-02-02 DIAGNOSIS — M6281 Muscle weakness (generalized): Secondary | ICD-10-CM | POA: Diagnosis not present

## 2024-02-05 DIAGNOSIS — F0390 Unspecified dementia without behavioral disturbance: Secondary | ICD-10-CM | POA: Diagnosis not present

## 2024-02-05 DIAGNOSIS — M6281 Muscle weakness (generalized): Secondary | ICD-10-CM | POA: Diagnosis not present

## 2024-02-07 NOTE — Progress Notes (Signed)
 Remote PPM Transmission

## 2024-02-11 DIAGNOSIS — M6281 Muscle weakness (generalized): Secondary | ICD-10-CM | POA: Diagnosis not present

## 2024-02-11 DIAGNOSIS — F0390 Unspecified dementia without behavioral disturbance: Secondary | ICD-10-CM | POA: Diagnosis not present

## 2024-02-13 DIAGNOSIS — R609 Edema, unspecified: Secondary | ICD-10-CM | POA: Diagnosis not present

## 2024-02-13 DIAGNOSIS — F32A Depression, unspecified: Secondary | ICD-10-CM | POA: Diagnosis not present

## 2024-02-13 DIAGNOSIS — N3281 Overactive bladder: Secondary | ICD-10-CM | POA: Diagnosis not present

## 2024-02-13 NOTE — Progress Notes (Signed)
 This encounter was created in error - please disregard.

## 2024-02-17 ENCOUNTER — Ambulatory Visit: Attending: Cardiology | Admitting: Cardiology

## 2024-02-27 DIAGNOSIS — T148XXA Other injury of unspecified body region, initial encounter: Secondary | ICD-10-CM | POA: Diagnosis not present

## 2024-02-27 DIAGNOSIS — R059 Cough, unspecified: Secondary | ICD-10-CM | POA: Diagnosis not present

## 2024-03-05 DIAGNOSIS — H9193 Unspecified hearing loss, bilateral: Secondary | ICD-10-CM | POA: Diagnosis not present

## 2024-03-09 DIAGNOSIS — E1151 Type 2 diabetes mellitus with diabetic peripheral angiopathy without gangrene: Secondary | ICD-10-CM | POA: Diagnosis not present

## 2024-03-09 DIAGNOSIS — L603 Nail dystrophy: Secondary | ICD-10-CM | POA: Diagnosis not present

## 2024-03-09 DIAGNOSIS — F0150 Vascular dementia without behavioral disturbance: Secondary | ICD-10-CM | POA: Diagnosis not present

## 2024-03-09 DIAGNOSIS — F32A Depression, unspecified: Secondary | ICD-10-CM | POA: Diagnosis not present

## 2024-03-12 DIAGNOSIS — I480 Paroxysmal atrial fibrillation: Secondary | ICD-10-CM | POA: Diagnosis not present

## 2024-03-12 DIAGNOSIS — I10 Essential (primary) hypertension: Secondary | ICD-10-CM | POA: Diagnosis not present

## 2024-03-17 DIAGNOSIS — E0800 Diabetes mellitus due to underlying condition with hyperosmolarity without nonketotic hyperglycemic-hyperosmolar coma (NKHHC): Secondary | ICD-10-CM | POA: Diagnosis not present

## 2024-03-23 ENCOUNTER — Ambulatory Visit: Payer: Medicare HMO

## 2024-03-23 DIAGNOSIS — I495 Sick sinus syndrome: Secondary | ICD-10-CM

## 2024-03-24 ENCOUNTER — Ambulatory Visit: Payer: Self-pay | Admitting: Cardiology

## 2024-03-24 LAB — CUP PACEART REMOTE DEVICE CHECK
Battery Remaining Longevity: 102 mo
Battery Voltage: 3.01 V
Brady Statistic AP VP Percent: 0.06 %
Brady Statistic AP VS Percent: 50.94 %
Brady Statistic AS VP Percent: 0.02 %
Brady Statistic AS VS Percent: 49 %
Brady Statistic RA Percent Paced: 51.19 %
Brady Statistic RV Percent Paced: 0.11 %
Date Time Interrogation Session: 20251109214400
Implantable Lead Connection Status: 753985
Implantable Lead Connection Status: 753985
Implantable Lead Implant Date: 20201124
Implantable Lead Implant Date: 20201124
Implantable Lead Location: 753859
Implantable Lead Location: 753860
Implantable Lead Model: 5076
Implantable Lead Model: 5076
Implantable Pulse Generator Implant Date: 20201124
Lead Channel Impedance Value: 361 Ohm
Lead Channel Impedance Value: 399 Ohm
Lead Channel Impedance Value: 437 Ohm
Lead Channel Impedance Value: 551 Ohm
Lead Channel Pacing Threshold Amplitude: 0.5 V
Lead Channel Pacing Threshold Amplitude: 0.625 V
Lead Channel Pacing Threshold Pulse Width: 0.4 ms
Lead Channel Pacing Threshold Pulse Width: 0.4 ms
Lead Channel Sensing Intrinsic Amplitude: 2.625 mV
Lead Channel Sensing Intrinsic Amplitude: 2.625 mV
Lead Channel Sensing Intrinsic Amplitude: 4.75 mV
Lead Channel Sensing Intrinsic Amplitude: 4.75 mV
Lead Channel Setting Pacing Amplitude: 1.5 V
Lead Channel Setting Pacing Amplitude: 2 V
Lead Channel Setting Pacing Pulse Width: 0.4 ms
Lead Channel Setting Sensing Sensitivity: 1.2 mV
Zone Setting Status: 755011
Zone Setting Status: 755011

## 2024-03-25 NOTE — Telephone Encounter (Signed)
 EP scheduler - Can someone contact patient for apt. ? Continue to receive AF alerts.

## 2024-03-26 NOTE — Progress Notes (Signed)
 Remote PPM Transmission

## 2024-03-27 NOTE — Telephone Encounter (Signed)
 Spoke w/ [facility] (located in [place]) - patient is scheduled to see Dr. Inocencio in [place] on 1/5. This was first available for Sabrina Hunt, as well as working with facility's conflict with coming to [place].

## 2024-03-27 NOTE — Telephone Encounter (Signed)
 Thank you :)

## 2024-04-02 DIAGNOSIS — F0390 Unspecified dementia without behavioral disturbance: Secondary | ICD-10-CM | POA: Diagnosis not present

## 2024-04-02 DIAGNOSIS — M6281 Muscle weakness (generalized): Secondary | ICD-10-CM | POA: Diagnosis not present

## 2024-04-06 DIAGNOSIS — M6281 Muscle weakness (generalized): Secondary | ICD-10-CM | POA: Diagnosis not present

## 2024-04-06 DIAGNOSIS — F0390 Unspecified dementia without behavioral disturbance: Secondary | ICD-10-CM | POA: Diagnosis not present

## 2024-04-07 DIAGNOSIS — Z961 Presence of intraocular lens: Secondary | ICD-10-CM | POA: Diagnosis not present

## 2024-04-07 DIAGNOSIS — E119 Type 2 diabetes mellitus without complications: Secondary | ICD-10-CM | POA: Diagnosis not present

## 2024-04-13 DIAGNOSIS — N3281 Overactive bladder: Secondary | ICD-10-CM | POA: Diagnosis not present

## 2024-04-13 DIAGNOSIS — F0150 Vascular dementia without behavioral disturbance: Secondary | ICD-10-CM | POA: Diagnosis not present

## 2024-04-13 DIAGNOSIS — D509 Iron deficiency anemia, unspecified: Secondary | ICD-10-CM | POA: Diagnosis not present

## 2024-04-13 DIAGNOSIS — E871 Hypo-osmolality and hyponatremia: Secondary | ICD-10-CM | POA: Diagnosis not present

## 2024-04-13 DIAGNOSIS — E039 Hypothyroidism, unspecified: Secondary | ICD-10-CM | POA: Diagnosis not present

## 2024-04-13 DIAGNOSIS — I10 Essential (primary) hypertension: Secondary | ICD-10-CM | POA: Diagnosis not present

## 2024-04-13 DIAGNOSIS — K219 Gastro-esophageal reflux disease without esophagitis: Secondary | ICD-10-CM | POA: Diagnosis not present

## 2024-04-13 DIAGNOSIS — I480 Paroxysmal atrial fibrillation: Secondary | ICD-10-CM | POA: Diagnosis not present

## 2024-04-15 DIAGNOSIS — R454 Irritability and anger: Secondary | ICD-10-CM | POA: Diagnosis not present

## 2024-04-15 DIAGNOSIS — F0150 Vascular dementia without behavioral disturbance: Secondary | ICD-10-CM | POA: Diagnosis not present

## 2024-05-17 NOTE — Progress Notes (Unsigned)
" °  Electrophysiology Office Note:   Date:  05/18/2024  ID:  Sabrina Hunt, DOB 1933/03/17, MRN [id number]  Primary Cardiologist: Jennifer JONELLE Crape, MD Primary Heart Failure: None Electrophysiologist: Myrah Strawderman Gladis Norton, MD      History of Present Illness:   Sabrina Hunt is a 89 y.o. female with h/o hypertension, CKD stage III, diabetes, hyperlipidemia, atrial fibrillation, tachybradycardia syndrome seen today for routine electrophysiology followup.   Discussed the use of AI scribe software for clinical note transcription with the patient, who gave verbal consent to proceed.  History of Present Illness She is an 89 year old female with a pacemaker who presents for routine follow-up of her device.  She generally feels good.  She has dementia.  She has no acute complaints at this time.  she denies chest pain, palpitations, dyspnea, PND, orthopnea, nausea, vomiting, dizziness, syncope, edema, weight gain, or early satiety.   Review of systems complete and found to be negative unless listed in HPI.      EP Information / Studies Reviewed:    EKG is ordered today. Personal review as below.  EKG Interpretation Date/Time:  Monday May 18 2024 12:20:49 EST Ventricular Rate:  88 PR Interval:  176 QRS Duration:  140 QT Interval:  416 QTC Calculation: 503 R Axis:   268  Text Interpretation: Sinus rhythm with occasional Premature atrial complexes Right bundle branch block Abnormal ECG When compared with ECG of 03-Jan-2023 10:49, Premature atrial complexes are now Present Confirmed by Emric Kowalewski ([id number]) on 05/18/2024 12:25:27 PM   PPM Interrogation-  reviewed in detail today,  See PACEART report.  Device History: Medtronic Dual Chamber PPM implanted 06/06/2018 for Tachy-Brady syndrome  Risk Assessment/Calculations:    CHA2DS2-VASc Score = 5   This indicates a 7.2% annual risk of stroke. The patient's score is based upon: CHF History: 0 HTN History: 1 Diabetes History: 1 Stroke  History: 0 Vascular Disease History: 0 Age Score: 2 Gender Score: 1            Physical Exam:   VS:  BP 110/70 (BP Location: Right Arm, Patient Position: Sitting, Cuff Size: Normal)   Pulse 88   Ht 5' 8 (1.727 m)   Wt 156 lb 12.8 oz (71.1 kg)   SpO2 97%   BMI 23.84 kg/m    Wt Readings from Last 3 Encounters:  05/18/24 156 lb 12.8 oz (71.1 kg)  01/03/23 164 lb 12.8 oz (74.8 kg)  01/08/22 170 lb (77.1 kg)     GEN: Well nourished, well developed in no acute distress NECK: No JVD; No carotid bruits CARDIAC: Regular rate and rhythm, no murmurs, rubs, gallops RESPIRATORY:  Clear to auscultation without rales, wheezing or rhonchi  ABDOMEN: Soft, non-tender, non-distended EXTREMITIES:  No edema; No deformity   ASSESSMENT AND PLAN:    Tachy-Brady syndrome s/p Medtronic PPM  Normal PPM function See Pace Art report No changes today  2.  Paroxysmal atrial fibrillation: On amiodarone .  Continues to have short episodes of atrial fibrillation.  Episodes do not appear to be causing symptoms.  [name] continue with current management.  3.  Hypertension: Well-controlled  4.  Secondary hypercoagulable state: On Eliquis   5.  High risk medication monitoring: On amiodarone .  Recent TSH and LFTs within normal limits  Disposition:   Follow up with EP Team 2 years   Signed, Safir Michalec Gladis Norton, MD  "

## 2024-05-18 ENCOUNTER — Encounter: Payer: Self-pay | Admitting: Cardiology

## 2024-05-18 ENCOUNTER — Ambulatory Visit: Payer: Self-pay | Admitting: Cardiology

## 2024-05-18 ENCOUNTER — Ambulatory Visit: Attending: Cardiology | Admitting: Cardiology

## 2024-05-18 VITALS — BP 110/70 | HR 88 | Ht 68.0 in | Wt 156.8 lb

## 2024-05-18 DIAGNOSIS — D6869 Other thrombophilia: Secondary | ICD-10-CM | POA: Diagnosis not present

## 2024-05-18 DIAGNOSIS — I10 Essential (primary) hypertension: Secondary | ICD-10-CM | POA: Diagnosis not present

## 2024-05-18 DIAGNOSIS — I480 Paroxysmal atrial fibrillation: Secondary | ICD-10-CM | POA: Diagnosis not present

## 2024-05-18 DIAGNOSIS — I495 Sick sinus syndrome: Secondary | ICD-10-CM | POA: Diagnosis not present

## 2024-05-18 LAB — CUP PACEART INCLINIC DEVICE CHECK
Battery Remaining Longevity: 101 mo
Battery Voltage: 3.01 V
Brady Statistic AP VP Percent: 2.9 %
Brady Statistic AP VS Percent: 63.35 %
Brady Statistic AS VP Percent: 0.04 %
Brady Statistic AS VS Percent: 33.72 %
Brady Statistic RA Percent Paced: 66.6 %
Brady Statistic RV Percent Paced: 2.94 %
Date Time Interrogation Session: 20260105135406
Implantable Lead Connection Status: 753985
Implantable Lead Connection Status: 753985
Implantable Lead Implant Date: 20201124
Implantable Lead Implant Date: 20201124
Implantable Lead Location: 753859
Implantable Lead Location: 753860
Implantable Lead Model: 5076
Implantable Lead Model: 5076
Implantable Pulse Generator Implant Date: 20201124
Lead Channel Impedance Value: 342 Ohm
Lead Channel Impedance Value: 399 Ohm
Lead Channel Impedance Value: 418 Ohm
Lead Channel Impedance Value: 532 Ohm
Lead Channel Pacing Threshold Amplitude: 0.5 V
Lead Channel Pacing Threshold Amplitude: 0.625 V
Lead Channel Pacing Threshold Pulse Width: 0.4 ms
Lead Channel Pacing Threshold Pulse Width: 0.4 ms
Lead Channel Sensing Intrinsic Amplitude: 2.375 mV
Lead Channel Sensing Intrinsic Amplitude: 2.5 mV
Lead Channel Sensing Intrinsic Amplitude: 4.25 mV
Lead Channel Sensing Intrinsic Amplitude: 4.625 mV
Lead Channel Setting Pacing Amplitude: 1.5 V
Lead Channel Setting Pacing Amplitude: 2 V
Lead Channel Setting Pacing Pulse Width: 0.4 ms
Lead Channel Setting Sensing Sensitivity: 1.2 mV
Zone Setting Status: 755011
Zone Setting Status: 755011

## 2024-06-10 ENCOUNTER — Telehealth: Payer: Self-pay | Admitting: Cardiology

## 2024-06-10 NOTE — Telephone Encounter (Signed)
 Pt son asking about information from her last office notes 1/5. Please advise.

## 2024-06-11 NOTE — Telephone Encounter (Signed)
 Pts son requesting a c/b

## 2024-06-11 NOTE — Telephone Encounter (Signed)
 Spoke with the patient's son, he asked that I call him back later.

## 2024-06-11 NOTE — Telephone Encounter (Signed)
 Spoke with the patient's son who would like an update on how her appointment went on 1/5 with Dr. Inocencio. He lives out of town and the patient does not communicate well on how appointments go. Reviewed visit notes with patient's son who verbalized understanding.

## 2024-06-22 ENCOUNTER — Encounter

## 2024-09-21 ENCOUNTER — Encounter

## 2024-12-21 ENCOUNTER — Encounter

## 2025-03-22 ENCOUNTER — Encounter

## 2025-06-21 ENCOUNTER — Encounter
